# Patient Record
Sex: Male | Born: 1950 | Race: Black or African American | Hispanic: No | Marital: Single | Smoking: Current every day smoker
Health system: Southern US, Community
[De-identification: ages and names within clinical notes are randomized; demographics above are authoritative.]

## PROBLEM LIST (undated history)

## (undated) DIAGNOSIS — D649 Anemia, unspecified: Secondary | ICD-10-CM

## (undated) DIAGNOSIS — C61 Malignant neoplasm of prostate: Secondary | ICD-10-CM

## (undated) DIAGNOSIS — C419 Malignant neoplasm of bone and articular cartilage, unspecified: Secondary | ICD-10-CM

## (undated) DIAGNOSIS — B1920 Unspecified viral hepatitis C without hepatic coma: Secondary | ICD-10-CM

## (undated) DIAGNOSIS — D696 Thrombocytopenia, unspecified: Secondary | ICD-10-CM

## (undated) DIAGNOSIS — R7989 Other specified abnormal findings of blood chemistry: Secondary | ICD-10-CM

## (undated) DIAGNOSIS — R945 Abnormal results of liver function studies: Secondary | ICD-10-CM

## (undated) HISTORY — PX: HAND SURGERY: SHX662

## (undated) HISTORY — PX: PROSTATE BIOPSY: SHX241

---

## 2007-08-02 ENCOUNTER — Emergency Department (HOSPITAL_COMMUNITY): Admission: EM | Admit: 2007-08-02 | Discharge: 2007-08-02 | Payer: Self-pay | Admitting: Emergency Medicine

## 2011-04-01 NOTE — Consult Note (Signed)
NAME:  TRESHAWN, ALLEN NO.:  [id number]   MEDICAL RECORD NO.:  [id number]          PATIENT TYPE:  EMS   LOCATION:  [facility]                         FACILITY:  [facility]   PHYSICIAN:  Artist Pais. Weingold, M.D.DATE OF BIRTH:  04-08-1951   DATE OF CONSULTATION:  08/02/2007  DATE OF DISCHARGE:                                 CONSULTATION   REASON FOR CONSULTATION:  Mr. 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  He has no known drug allergies.  Currently on  tramadol for arthritis type pains.  No recent hospitalizations or  surgery.   FAMILY HISTORY:  Noncontributory.   SOCIAL HISTORY:  Noncontributory.   PHYSICAL EXAMINATION:  Examination today, well-nourished male, pleasant,  alert, and oriented x3.  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  It is transverse in  nature.  He has the amputated part with him which unfortunately has  segmental injuries and traction signs of the digital artery and digital  nerve.   X-rays show a DIP level amputation.   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  Today, we had a thorough and frank discussion about our treatment  options.  Unfortunately, his tip is not replantable.  It is segmental in  the nature of its injury with traction signs.  Therefore, we discussed a  skeletal shortening and primary closure with a V-Y flap.  He was taken  to the Procedure Room in the Emergency Room.  He was given a 0.25%  Marcaine digital sheath block when active anesthesia was obtained.  He  was prepped and draped in the usual sterile fashion.  A Penrose drain  was used as a tourniquet.  Bilateral neurectomies were performed  followed by  skeletal shortening using rongeur of the head of the middle  phalanx.  After this was done, a 15 blade was used to raise a V-Y flap  which was advanced distally over the exposed distal phalangeal bone and  sutured with 4-0 Vicryl Rapide suture.  It was advanced proximally 1 cm.  The defect was closed with the same 4-0 Vicryl Rapide suture.  He was  then placed in a sterile dressing of Xeroform 4x4, fluffs and a Coban  wrap.  The patient was discharged in the Emergency Department with  Keflex 500 mg 4 times a day for 5 days of antibiotic prophylaxis.  He  received a gram of Ancef in the Emergency Room.  His tetanus was up to  date.  He was also given Percocet for pain.  He is to followup in my  office this Thursday.  He is to call me if there is any signs of  infection, bleeding, etc.      Artist Pais. Mina Marble, M.D.  Electronically Signed     MAW/MEDQ  D:  08/02/2007  T:  08/03/2007  Job:  [id number]

## 2014-11-14 ENCOUNTER — Encounter (HOSPITAL_COMMUNITY): Payer: Self-pay | Admitting: *Deleted

## 2014-11-14 ENCOUNTER — Emergency Department (HOSPITAL_COMMUNITY)
Admission: EM | Admit: 2014-11-14 | Discharge: 2014-11-14 | Disposition: A | Discharge status: Home / Self Care | Payer: Medicaid Other | Attending: Emergency Medicine | Admitting: Emergency Medicine

## 2014-11-14 DIAGNOSIS — X58XXXA Exposure to other specified factors, initial encounter: Secondary | ICD-10-CM | POA: Diagnosis not present

## 2014-11-14 DIAGNOSIS — Y9389 Activity, other specified: Secondary | ICD-10-CM | POA: Diagnosis not present

## 2014-11-14 DIAGNOSIS — T161XXA Foreign body in right ear, initial encounter: Secondary | ICD-10-CM | POA: Insufficient documentation

## 2014-11-14 DIAGNOSIS — Y929 Unspecified place or not applicable: Secondary | ICD-10-CM | POA: Insufficient documentation

## 2014-11-14 DIAGNOSIS — Z720 Tobacco use: Secondary | ICD-10-CM | POA: Insufficient documentation

## 2014-11-14 DIAGNOSIS — H9201 Otalgia, right ear: Secondary | ICD-10-CM | POA: Diagnosis present

## 2014-11-14 DIAGNOSIS — Y998 Other external cause status: Secondary | ICD-10-CM | POA: Insufficient documentation

## 2014-11-14 NOTE — Discharge Instructions (Signed)
Ear Foreign Body An ear foreign body is an object that is stuck in the ear. Objects in the ear can cause pain, hearing loss, and buzzing or roaring sounds. They can also cause fluid to come from the ear. HOME CARE   Keep all doctor visits as told.  Keep small objects away from children. Tell them not to put things in their ears. GET HELP RIGHT AWAY IF:   You have blood coming from your ear.  You have more pain or puffiness (swelling) in the ear.  You have trouble hearing.  You have fluid (discharge) coming from the ear.  You have a fever.  You get a headache. MAKE SURE YOU:   Understand these instructions.  Will watch your condition.  Will get help right away if you are not doing well or get worse. Document Released: 04/23/2010 Document Revised: 01/26/2012 Document Reviewed: 04/23/2010 [facility] Patient Information 2015 [place], [place]. This information is not intended to replace advice given to you by your health care provider. Make sure you discuss any questions you have with your health care provider.

## 2014-11-14 NOTE — ED Notes (Signed)
Pt was using a glass thermometer  To scratch his rt ear and thermometer broke off in ear.  Pt says the mercury fell out on the bed.

## 2014-11-14 NOTE — ED Provider Notes (Signed)
CSN: [id number]     Arrival date & time 11/14/14  1157 History   First MD Initiated Contact with Patient 11/14/14 1356     Chief Complaint  Patient presents with  . Otalgia     (Consider location/radiation/quality/duration/timing/severity/associated sxs/prior Treatment) HPI  Ryan Ware is a 63 y.o. male who presents to the Emergency Department complaining of foreign body to the right ear canal.  He states that he was using a glass thermometer to scratch his ear and the thermometer broke leaving the metal tip inside his ear.  He states that he tilted his ear and saw the mercury fall out.  He reports decreased hearing to the right ear and "pressure".  He denies bleeding of the ear, dizziness or headache.     History reviewed. No pertinent past medical history. Past Surgical History  Procedure Laterality Date  . Hand surgery     History reviewed. No pertinent family history. History  Substance Use Topics  . Smoking status: Current Every Day Smoker  . Smokeless tobacco: Not on file  . Alcohol Use: Yes    Review of Systems  Constitutional: Negative for fever.  HENT: Positive for ear pain and hearing loss. Negative for ear discharge, facial swelling and trouble swallowing.   Skin: Negative for color change.  Neurological: Negative for dizziness, weakness, numbness and headaches.  All other systems reviewed and are negative.     Allergies  Review of patient's allergies indicates no known allergies.  Home Medications   Prior to Admission medications   Medication Sig Start Date End Date Taking? Authorizing Provider  Multiple Vitamin (MULTIVITAMIN WITH MINERALS) TABS tablet Take 1 tablet by mouth daily.   Yes Historical Provider, MD   BP 124/81 mmHg  Pulse 85  Temp(Src) 98.2 F (36.8 C) (Oral)  Resp 20  Ht 6\' 6"  (1.981 m)  Wt 230 lb (104.327 kg)  BMI 26.58 kg/m2  SpO2 97% Physical Exam  Constitutional: He is oriented to person, place, and time. He appears  well-developed and well-nourished. No distress.  HENT:  Head: Normocephalic and atraumatic.  Right Ear: External ear normal. A foreign body is present. No hemotympanum.  Left Ear: Tympanic membrane and ear canal normal.  Mouth/Throat: Oropharynx is clear and moist.  Foreign body of the right ear canal.  No bleeding or lacerations to the external ear of canal.    Neck: Normal range of motion. Neck supple.  Cardiovascular: Normal rate, regular rhythm, normal heart sounds and intact distal pulses.   No murmur heard. Pulmonary/Chest: Effort normal and breath sounds normal. No respiratory distress.  Musculoskeletal: Normal range of motion.  Neurological: He is alert and oriented to person, place, and time. He exhibits normal muscle tone. Coordination normal.  Skin: Skin is warm and dry.  Nursing note and vitals reviewed.   ED Course  Procedures (including critical care time) Labs Review Labs Reviewed - No data to display  Imaging Review No results found.   EKG Interpretation None      MDM   Final diagnoses:  Foreign body in ear, right, initial encounter    Several attempts made to remove the object using alligator forceps, but unsuccessful.  No bleeding to the canal.  Will consult ENT.    [place] Dr. Wilburn Cornelia, discussed findings.  Will see pt in his office tomorrow.  Senya Hinzman L. [place], PA-C 11/15/14 [place], DO 11/16/14 [phone]

## 2016-01-28 ENCOUNTER — Emergency Department (HOSPITAL_COMMUNITY)
Admission: EM | Admit: 2016-01-28 | Discharge: 2016-01-28 | Disposition: A | Discharge status: Home / Self Care | Payer: Medicaid Other | Attending: Emergency Medicine | Admitting: Emergency Medicine

## 2016-01-28 ENCOUNTER — Emergency Department (HOSPITAL_COMMUNITY): Payer: Medicaid Other

## 2016-01-28 ENCOUNTER — Encounter (HOSPITAL_COMMUNITY): Payer: Self-pay | Admitting: Emergency Medicine

## 2016-01-28 DIAGNOSIS — Z79899 Other long term (current) drug therapy: Secondary | ICD-10-CM | POA: Diagnosis not present

## 2016-01-28 DIAGNOSIS — M549 Dorsalgia, unspecified: Secondary | ICD-10-CM | POA: Insufficient documentation

## 2016-01-28 DIAGNOSIS — R319 Hematuria, unspecified: Secondary | ICD-10-CM | POA: Diagnosis not present

## 2016-01-28 DIAGNOSIS — F17210 Nicotine dependence, cigarettes, uncomplicated: Secondary | ICD-10-CM | POA: Diagnosis not present

## 2016-01-28 DIAGNOSIS — R109 Unspecified abdominal pain: Secondary | ICD-10-CM | POA: Diagnosis present

## 2016-01-28 DIAGNOSIS — M546 Pain in thoracic spine: Secondary | ICD-10-CM

## 2016-01-28 LAB — URINALYSIS, ROUTINE W REFLEX MICROSCOPIC
BILIRUBIN URINE: NEGATIVE
GLUCOSE, UA: NEGATIVE mg/dL
Ketones, ur: NEGATIVE mg/dL
Leukocytes, UA: NEGATIVE
Nitrite: NEGATIVE
PH: 6 (ref 5.0–8.0)

## 2016-01-28 LAB — CBC WITH DIFFERENTIAL/PLATELET
Basophils Absolute: 0 10*3/uL (ref 0.0–0.1)
Basophils Relative: 1 %
EOS ABS: 0.1 10*3/uL (ref 0.0–0.7)
Eosinophils Relative: 2 %
HEMATOCRIT: 51.1 % (ref 39.0–52.0)
HEMOGLOBIN: 16.7 g/dL (ref 13.0–17.0)
LYMPHS ABS: 3.3 10*3/uL (ref 0.7–4.0)
Lymphocytes Relative: 52 %
MCH: 29.4 pg (ref 26.0–34.0)
MCHC: 32.7 g/dL (ref 30.0–36.0)
MCV: 90 fL (ref 78.0–100.0)
MONO ABS: 0.4 10*3/uL (ref 0.1–1.0)
MONOS PCT: 7 %
NEUTROS ABS: 2.4 10*3/uL (ref 1.7–7.7)
NEUTROS PCT: 38 %
Platelets: 140 10*3/uL — ABNORMAL LOW (ref 150–400)
RBC: 5.68 MIL/uL (ref 4.22–5.81)
RDW: 15.4 % (ref 11.5–15.5)
WBC: 6.2 10*3/uL (ref 4.0–10.5)

## 2016-01-28 LAB — BASIC METABOLIC PANEL
Anion gap: 5 (ref 5–15)
BUN: 15 mg/dL (ref 6–20)
CHLORIDE: 104 mmol/L (ref 101–111)
CO2: 30 mmol/L (ref 22–32)
CREATININE: 1.1 mg/dL (ref 0.61–1.24)
Calcium: 9.5 mg/dL (ref 8.9–10.3)
GFR calc Af Amer: >60 mL/min (ref >60)
GFR calc non Af Amer: >60 mL/min (ref >60)
Glucose, Bld: 95 mg/dL (ref 65–99)
Potassium: 4 mmol/L (ref 3.5–5.1)
Sodium: 139 mmol/L (ref 135–145)

## 2016-01-28 LAB — URINE MICROSCOPIC-ADD ON

## 2016-01-28 LAB — TROPONIN I: Troponin I: <0.03 ng/mL (ref <0.031)

## 2016-01-28 LAB — D-DIMER, QUANTITATIVE: D-Dimer, Quant: 3.84 ug/mL-FEU — ABNORMAL HIGH (ref 0.00–0.50)

## 2016-01-28 MED ORDER — IOHEXOL 350 MG/ML SOLN
100.0000 mL | Freq: Once | INTRAVENOUS | Status: AC | PRN
  Administered 2016-01-28: 100 mL via INTRAVENOUS

## 2016-01-28 MED ORDER — CEPHALEXIN 500 MG PO CAPS: 500.0000 mg | ORAL_CAPSULE | Freq: Four times a day (QID) | ORAL | Status: DC

## 2016-01-28 MED ORDER — DEXTROSE 5 % IV SOLN
1.0000 g | Freq: Once | INTRAVENOUS | Status: AC
  Administered 2016-01-28: 1 g via INTRAVENOUS
  Filled 2016-01-28: qty 10

## 2016-01-28 MED ORDER — HYDROCODONE-ACETAMINOPHEN 5-325 MG PO TABS: 1.0000 | ORAL_TABLET | Freq: Four times a day (QID) | ORAL | Status: DC | PRN

## 2016-01-28 MED ORDER — ACETAMINOPHEN 325 MG PO TABS
650.0000 mg | ORAL_TABLET | Freq: Once | ORAL | Status: AC
  Administered 2016-01-28: 650 mg via ORAL
  Filled 2016-01-28: qty 2

## 2016-01-28 NOTE — ED Provider Notes (Signed)
CSN: [id number]     Arrival date & time 01/28/16  1157 History   First MD Initiated Contact with Patient 01/28/16 1453     Chief Complaint  Patient presents with  . Flank Pain     (Consider location/radiation/quality/duration/timing/severity/associated sxs/prior Treatment) Patient is a 65 y.o. male presenting with flank pain. The history is provided by the patient.  Flank Pain Associated symptoms include chest pain. Pertinent negatives include no abdominal pain, no headaches and no shortness of breath.   patient with a three-week history of right thoracic back pain just at the tip of the scapula radiates around to the right anterior part of the chest. Also today started with blood in the urine. This is a new finding. No pain with urination. No fevers no nausea or vomiting no abdominal pain no shortness of breath. The back pain is constant. Not made worse by movements of the right arm.  History reviewed. No pertinent past medical history. Past Surgical History  Procedure Laterality Date  . Hand surgery     No family history on file. Social History  Substance Use Topics  . Smoking status: Current Every Day Smoker    Types: Cigarettes  . Smokeless tobacco: None  . Alcohol Use: Yes     Comment: occasional    Review of Systems  Constitutional: Negative for fever.  HENT: Negative for congestion.   Eyes: Negative for visual disturbance.  Respiratory: Negative for shortness of breath.   Cardiovascular: Positive for chest pain. Negative for leg swelling.  Gastrointestinal: Negative for abdominal pain.  Genitourinary: Positive for hematuria and flank pain. Negative for dysuria.  Musculoskeletal: Positive for back pain. Negative for neck pain.  Skin: Negative for rash.  Neurological: Negative for headaches.  Hematological: Does not bruise/bleed easily.  Psychiatric/Behavioral: Negative for confusion.      Allergies  Review of patient's allergies indicates no known  allergies.  Home Medications   Prior to Admission medications   Medication Sig Start Date End Date Taking? Authorizing Provider  aspirin-acetaminophen-caffeine (EXCEDRIN MIGRAINE) [phone] MG tablet Take 1 tablet by mouth every 6 (six) hours as needed (pain).   Yes Historical Provider, MD  ibuprofen (ADVIL,MOTRIN) 200 MG tablet Take 400 mg by mouth every 6 (six) hours as needed for moderate pain.   Yes Historical Provider, MD  Multiple Vitamin (MULTIVITAMIN WITH MINERALS) TABS tablet Take 1 tablet by mouth daily.   Yes Historical Provider, MD  cephALEXin (KEFLEX) 500 MG capsule Take 1 capsule (500 mg total) by mouth 4 (four) times daily. 01/28/16   Fredia Sorrow, MD  HYDROcodone-acetaminophen (NORCO/VICODIN) 5-325 MG tablet Take 1-2 tablets by mouth every 6 (six) hours as needed. 01/28/16   Fredia Sorrow, MD   BP 147/62 mmHg  Pulse 64  Temp(Src) 97.9 F (36.6 C) (Oral)  Resp 19  Ht 6' 5.5" (1.969 m)  Wt 95.737 kg  BMI 24.69 kg/m2  SpO2 97% Physical Exam  Constitutional: He is oriented to person, place, and time. He appears well-developed and well-nourished. No distress.  HENT:  Head: Normocephalic and atraumatic.  Mouth/Throat: Oropharynx is clear and moist.  Eyes: Conjunctivae and EOM are normal. Pupils are equal, round, and reactive to light.  Neck: Normal range of motion. Neck supple.  Cardiovascular: Normal rate, regular rhythm and normal heart sounds.   No murmur heard. Pulmonary/Chest: Effort normal and breath sounds normal. No respiratory distress. He exhibits no tenderness.  Abdominal: Soft. Bowel sounds are normal. There is no tenderness.  Musculoskeletal: Normal range of motion.  Neurological: He is alert and oriented to person, place, and time. No cranial nerve deficit. He exhibits normal muscle tone. Coordination normal.  Skin: Skin is warm. No rash noted.  Nursing note and vitals reviewed.   ED Course  Procedures (including critical care time) Labs  Review Labs Reviewed  URINALYSIS, ROUTINE W REFLEX MICROSCOPIC (NOT AT [facility]) - Abnormal; Notable for the following:    Specific Gravity, Urine >1.030 (*)    Hgb urine dipstick LARGE (*)    Protein, ur TRACE (*)    All other components within normal limits  CBC WITH DIFFERENTIAL/PLATELET - Abnormal; Notable for the following:    Platelets 140 (*)    All other components within normal limits  D-DIMER, QUANTITATIVE (NOT AT [facility]) - Abnormal; Notable for the following:    D-Dimer, Quant 3.84 (*)    All other components within normal limits  URINE MICROSCOPIC-ADD ON - Abnormal; Notable for the following:    Squamous Epithelial / LPF 0-5 (*)    Bacteria, UA FEW (*)    All other components within normal limits  URINE CULTURE  BASIC METABOLIC PANEL  TROPONIN I    Imaging Review Dg Chest 2 View  01/28/2016  CLINICAL DATA:  Flank pain and hematuria. Back pain and anterior chest pain. EXAM: CHEST  2 VIEW COMPARISON:  None. FINDINGS: The heart size and mediastinal contours are within normal limits. Both lungs are clear. The visualized skeletal structures are unremarkable. Slight elevation the left hemidiaphragm, nonspecific. IMPRESSION: No acute abnormality. Electronically Signed   By: Lorriane Shire M.D.   On: 01/28/2016 15:45   Ct Angio Chest Pe W/cm &/or Wo Cm  01/28/2016  CLINICAL DATA:  Right-sided chest pain and right flank pain with hematuria for 3 days, elevated D-dimer EXAM: CT ANGIOGRAPHY CHEST WITH CONTRAST TECHNIQUE: Multidetector CT imaging of the chest was performed using the standard protocol during bolus administration of intravenous contrast. Multiplanar CT image reconstructions and MIPs were obtained to evaluate the vascular anatomy. CONTRAST:  128mL OMNIPAQUE IOHEXOL 350 MG/ML SOLN COMPARISON:  Mediastinum/Nodes: Thoracic inlet is normal. There is no significant hilar or mediastinal adenopathy. Heart appears normal. There are no pericardial effusions. The thoracic aorta shows no  dissection or dilatation. There are no filling defects in the pulmonary arterial system. Lungs/Pleura: Lungs are clear except for mild bilateral dependent atelectasis. There are no pleural effusions. Upper abdomen: Negative Musculoskeletal: The T9 vertebral body demonstrates lytic change and compression deformity with an acute appearance. Compression involves both the superior and inferior endplates and involves about 25% height. No retropulsion. There is suggestion of abnormal soft tissue around the vertebral body suggesting paraspinous mass. There is lytic change in the posterior aspect of the T10 vertebral body with loss of central posterior cortex. There is evidence of expansion of the posterior cortex causing canal narrowing. FINDINGS: T9 compression deformity, possibly pathologic. T10 vertebral body also shows lytic lesion expanding through the posterior central cortex and narrowing the canal mildly. Review of the MIP images confirms the above findings. Electronically Signed   By: Skipper Cliche M.D.   On: 01/28/2016 16:48   I have personally reviewed and evaluated these images and lab results as part of my medical decision-making.   EKG Interpretation   Date/Time:  Monday January 28 2016 15:10:06 EDT Ventricular Rate:  65 PR Interval:  196 QRS Duration: 113 QT Interval:  428 QTC Calculation: 445 R Axis:   -99 Text Interpretation:  Sinus rhythm LAE, consider biatrial enlargement Left  anterior  fascicular block Minimal ST elevation, anterior leads No previous  ECGs available Confirmed by Prabhleen Montemayor  MD, Darene Nappi [phone]) on 01/28/2016  3:23:44 PM      MDM   Final diagnoses:  Hematuria  Right-sided thoracic back pain    Patient with complaint of pain at the tip of his right scapula. And blood in his urine which is been present as of this morning. The pain in the thoracic part of the back is been present for 3 weeks. Does radiate around to the anterior part of the chest. Clinically was  concerned about possible pulmonary embolus but no hypoxia. CT angiogram was done because the d-dimer was markedly elevated. However CT angios negative for any pulmonary embolus. It did find some lesions in the thoracic spine. Now the concern is for metastatic cancer prostate may be a high probability. The patient also with the onset of hematuria today will be treated as if it could be infection. Urine culture sent. Will be started on a course of Keflex. Given 1 g Rocephin here. Referral with urology as a starting point. Patient does not have a primary care doctor.    Fredia Sorrow, MD 01/28/16 1747

## 2016-01-28 NOTE — Discharge Instructions (Signed)
For the hematuria take the antibiotic Keflex as directed. Make an appointment to follow-up with urology. Also as we discussed there are some lesions on the thoracic part of your back that could be related to cancer periods also possible could be related to prostate cancer that is spread. Make an appointment with urology for follow-up with this as well. Take the pain medicine as needed.

## 2016-01-28 NOTE — ED Notes (Signed)
Dr Rogene Houston informed of D-dimer of 3.84

## 2016-01-28 NOTE — ED Notes (Signed)
Pt c/o right flank pain with hematuria x 3 days.

## 2016-01-29 LAB — URINE CULTURE

## 2016-02-21 ENCOUNTER — Emergency Department (HOSPITAL_COMMUNITY): Payer: Medicaid Other

## 2016-02-21 ENCOUNTER — Emergency Department (HOSPITAL_COMMUNITY)
Admission: EM | Admit: 2016-02-21 | Discharge: 2016-02-21 | Disposition: A | Discharge status: Home / Self Care | Payer: Medicaid Other | Attending: Emergency Medicine | Admitting: Emergency Medicine

## 2016-02-21 ENCOUNTER — Encounter (HOSPITAL_COMMUNITY): Payer: Self-pay | Admitting: Emergency Medicine

## 2016-02-21 DIAGNOSIS — M546 Pain in thoracic spine: Secondary | ICD-10-CM

## 2016-02-21 DIAGNOSIS — Z79899 Other long term (current) drug therapy: Secondary | ICD-10-CM | POA: Diagnosis not present

## 2016-02-21 DIAGNOSIS — M545 Low back pain: Secondary | ICD-10-CM | POA: Diagnosis present

## 2016-02-21 DIAGNOSIS — F17210 Nicotine dependence, cigarettes, uncomplicated: Secondary | ICD-10-CM | POA: Diagnosis not present

## 2016-02-21 LAB — BASIC METABOLIC PANEL
ANION GAP: 9 (ref 5–15)
BUN: 14 mg/dL (ref 6–20)
CO2: 30 mmol/L (ref 22–32)
Calcium: 9.6 mg/dL (ref 8.9–10.3)
Chloride: 100 mmol/L — ABNORMAL LOW (ref 101–111)
Creatinine, Ser: 0.93 mg/dL (ref 0.61–1.24)
GLUCOSE: 174 mg/dL — AB (ref 65–99)
Potassium: 3.9 mmol/L (ref 3.5–5.1)
Sodium: 139 mmol/L (ref 135–145)

## 2016-02-21 LAB — CBC WITH DIFFERENTIAL/PLATELET
BASOS ABS: 0 10*3/uL (ref 0.0–0.1)
BASOS PCT: 0 %
EOS ABS: 0.1 10*3/uL (ref 0.0–0.7)
EOS PCT: 1 %
HCT: 47.7 % (ref 39.0–52.0)
Hemoglobin: 16.1 g/dL (ref 13.0–17.0)
Lymphocytes Relative: 29 %
Lymphs Abs: 2.5 10*3/uL (ref 0.7–4.0)
MCH: 29.9 pg (ref 26.0–34.0)
MCHC: 33.8 g/dL (ref 30.0–36.0)
MCV: 88.5 fL (ref 78.0–100.0)
MONO ABS: 0.7 10*3/uL (ref 0.1–1.0)
MONOS PCT: 8 %
Neutro Abs: 5.4 10*3/uL (ref 1.7–7.7)
Neutrophils Relative %: 62 %
PLATELETS: 75 10*3/uL — AB (ref 150–400)
RBC: 5.39 MIL/uL (ref 4.22–5.81)
RDW: 15.1 % (ref 11.5–15.5)
WBC: 8.7 10*3/uL (ref 4.0–10.5)

## 2016-02-21 LAB — URINALYSIS, ROUTINE W REFLEX MICROSCOPIC
GLUCOSE, UA: NEGATIVE mg/dL
KETONES UR: NEGATIVE mg/dL
Nitrite: NEGATIVE
PROTEIN: 100 mg/dL — AB
Specific Gravity, Urine: 1.025 (ref 1.005–1.030)
pH: 5 (ref 5.0–8.0)

## 2016-02-21 LAB — URINE MICROSCOPIC-ADD ON

## 2016-02-21 LAB — HEPATIC FUNCTION PANEL
ALBUMIN: 3.8 g/dL (ref 3.5–5.0)
ALT: 84 U/L — AB (ref 17–63)
AST: 91 U/L — AB (ref 15–41)
Alkaline Phosphatase: 83 U/L (ref 38–126)
BILIRUBIN DIRECT: 0.2 mg/dL (ref 0.1–0.5)
BILIRUBIN TOTAL: 0.5 mg/dL (ref 0.3–1.2)
Indirect Bilirubin: 0.3 mg/dL (ref 0.3–0.9)
Total Protein: 8.2 g/dL — ABNORMAL HIGH (ref 6.5–8.1)

## 2016-02-21 MED ORDER — TAMSULOSIN HCL 0.4 MG PO CAPS: 0.4000 mg | ORAL_CAPSULE | Freq: Every day | ORAL | Status: DC

## 2016-02-21 MED ORDER — OXYCODONE-ACETAMINOPHEN 5-325 MG PO TABS
1.0000 | ORAL_TABLET | Freq: Once | ORAL | Status: AC
  Administered 2016-02-21: 1 via ORAL
  Filled 2016-02-21: qty 1

## 2016-02-21 MED ORDER — OXYCODONE-ACETAMINOPHEN 5-325 MG PO TABS: 1.0000 | ORAL_TABLET | Freq: Four times a day (QID) | ORAL | Status: DC | PRN

## 2016-02-21 NOTE — ED Notes (Addendum)
Pt reports LT sided shoulder pain x 1 month. Pt seen here last month and was diagnosed with hematuria. Reports hematuria has resolved. Pt also found to have lesions on thoracic spine that could be related to metastatic prostate cancer. Pt states he has an appointment with urology on 4/18, but states pain is unbearable.

## 2016-02-21 NOTE — ED Provider Notes (Signed)
CSN: [id number]     Arrival date & time 02/21/16  1503 History   First MD Initiated Contact with Patient 02/21/16 1551     Chief Complaint  Patient presents with  . Shoulder Pain     (Consider location/radiation/quality/duration/timing/severity/associated sxs/prior Treatment) HPI 65 year old male who presents with back pain. Was recently seen in the emergency department in March 2017 with left upper back pain. He had a CT chest at that time that was concerning for compression fracture and lesions in the thoracic spine with possible paraspinous mass concerning for metastatic lesions. Set up for urology appointment for w/u of probably prostate cancer. Does not have PCP. States that he has continued to have progressively worsening pain in his thoracic back now are primarily involving the left upper back around the left scapula. He has not had any falls or trauma. Pain radiates around his rib cage to the chest wall anteriorly, and worse with certain positions and movement of his left arm. He has not had any numbness or weakness, fevers or chills. Has noted that since his last ED visit he has lost 15 pounds unintentionally and occasionally with some night sweats. Not having any cough, shortness of breath, syncope or near syncope, leg swelling or leg pain.   Past Medical History  Diagnosis Date  . Cancer ([place])     Prostrate Cancer with mets   Past Surgical History  Procedure Laterality Date  . Hand surgery     No family history on file. Social History  Substance Use Topics  . Smoking status: Current Every Day Smoker -- 0.50 packs/day    Types: Cigarettes  . Smokeless tobacco: None  . Alcohol Use: Yes     Comment: occasional    Review of Systems 10/14 systems reviewed and are negative other than those stated in the HPI   Allergies  Review of patient's allergies indicates no known allergies.  Home Medications   Prior to Admission medications   Medication Sig Start Date End Date  Taking? Authorizing Provider  acetaminophen (TYLENOL) 500 MG tablet Take 500 mg by mouth every 6 (six) hours as needed for mild pain or moderate pain.   Yes Historical Provider, MD  Multiple Vitamin (MULTIVITAMIN WITH MINERALS) TABS tablet Take 1 tablet by mouth daily.   Yes Historical Provider, MD  cephALEXin (KEFLEX) 500 MG capsule Take 1 capsule (500 mg total) by mouth 4 (four) times daily. Patient not taking: Reported on 02/21/2016 01/28/16   Fredia Sorrow, MD  HYDROcodone-acetaminophen (NORCO/VICODIN) 5-325 MG tablet Take 1-2 tablets by mouth every 6 (six) hours as needed. Patient not taking: Reported on 02/21/2016 01/28/16   Fredia Sorrow, MD  oxyCODONE-acetaminophen (PERCOCET/ROXICET) 5-325 MG tablet Take 1 tablet by mouth every 6 (six) hours as needed for moderate pain or severe pain. 02/21/16   Forde Dandy, MD  tamsulosin (FLOMAX) 0.4 MG CAPS capsule Take 1 capsule (0.4 mg total) by mouth daily. 02/21/16   Forde Dandy, MD   BP 118/69 mmHg  Pulse 87  Temp(Src) 98.2 F (36.8 C) (Oral)  Resp 17  Ht 6\' 5"  (1.956 m)  Wt 210 lb (95.255 kg)  BMI 24.90 kg/m2  SpO2 97% Physical Exam Physical Exam  Nursing note and vitals reviewed. Constitutional: Non-toxic, and in no acute distress Head: Normocephalic and atraumatic.  Mouth/Throat: Oropharynx is clear and moist.  Neck: Normal range of motion. Neck supple.  Cardiovascular: Normal rate and regular rhythm.   Pulmonary/Chest: Effort normal and breath sounds normal. Tender over left  paraspinal thoracic just medial to the scapula. No palpable deformities or bruising. Abdominal: Soft. There is no tenderness. There is no rebound and no guarding.  Musculoskeletal: Normal range of motion.  Neurological: Alert, no facial droop, fluent speech, moves all extremities symmetrically, normal gait, full sensation in bilateral lower extremities Skin: Skin is warm and dry.  Psychiatric: Cooperative  ED Course  Procedures (including critical care  time) Labs Review Labs Reviewed  URINALYSIS, ROUTINE W REFLEX MICROSCOPIC (NOT AT [facility]) - Abnormal; Notable for the following:    Color, Urine AMBER (*)    APPearance HAZY (*)    Hgb urine dipstick LARGE (*)    Bilirubin Urine SMALL (*)    Protein, ur 100 (*)    Leukocytes, UA TRACE (*)    All other components within normal limits  CBC WITH DIFFERENTIAL/PLATELET - Abnormal; Notable for the following:    Platelets 75 (*)    All other components within normal limits  BASIC METABOLIC PANEL - Abnormal; Notable for the following:    Chloride 100 (*)    Glucose, Bld 174 (*)    All other components within normal limits  HEPATIC FUNCTION PANEL - Abnormal; Notable for the following:    Total Protein 8.2 (*)    AST 91 (*)    ALT 84 (*)    All other components within normal limits  URINE MICROSCOPIC-ADD ON - Abnormal; Notable for the following:    Squamous Epithelial / LPF 0-5 (*)    Bacteria, UA FEW (*)    All other components within normal limits  PSA  PSA, TOTAL AND FREE    Imaging Review Dg Chest 2 View  02/21/2016  CLINICAL DATA:  Left upper back pain, left shoulder pain, left side chest pain EXAM: CHEST  2 VIEW COMPARISON:  01/28/2016 FINDINGS: Cardiomediastinal silhouette is stable. Again noted mild elevation of the left hemidiaphragm. No infiltrate or pleural effusion. Bony thorax is unremarkable. IMPRESSION: No active cardiopulmonary disease.  No significant change. Electronically Signed   By: Lahoma Crocker M.D.   On: 02/21/2016 17:10     I have personally reviewed and evaluated these images and lab results as part of my medical decision-making.   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 On review of the CTA performed last month, with compression fracture associated with this as well. Progressive  pain since this diagnosis, and has not yet followed up with any physician. Spoke with Dr. Alyson Ingles, who patient has scheduled appt with on 4/18. To expedite work-up, obtaining PSA and CMP. He requests that patient call his office tomorrow for same day follow-up, and this was relayed to patient. Case management consult placed for patient as well to coordinate care and obtain PCP. Patient with in tact neuro exam. No complaints of saddle anesthesia, bowel/urinary incontinence/retention. He is able to ambulate. I do no suspect spinal cord compression at this time. Provided strict return and follow-up instructions with course of opioid analgesics until follow-up.    Forde Dandy, MD 02/22/16 [phone]

## 2016-02-21 NOTE — Discharge Instructions (Signed)
Based on your last CT scan, your back pain was concerning for lesions and fracture that could be suggestive of cancer. Dr. Noah Delaine, from urology, recommended that you call his office tomorrow morning to see if he will be able to see you tomorrow. Please return without fail for worsening symptoms, including fevers, worsening pain, loss of control of bowel, numbness or weakness in the legs, inability to walk, loss of control of urine, or any other symptoms concerning to you.  Back Pain, Adult Back pain is very common. The pain often gets better over time. The cause of back pain is usually not dangerous. Most people can learn to manage their back pain on their own.  HOME CARE  Watch your back pain for any changes. The following actions may help to lessen any pain you are feeling:  Stay active. Start with short walks on flat ground if you can. Try to walk farther each day.  Exercise regularly as told by your doctor. Exercise helps your back heal faster. It also helps avoid future injury by keeping your muscles strong and flexible.  Do not sit, drive, or stand in one place for more than 30 minutes.  Do not stay in bed. Resting more than 1-2 days can slow down your recovery.  Be careful when you bend or lift an object. Use good form when lifting:  Bend at your knees.  Keep the object close to your body.  Do not twist.  Sleep on a firm mattress. Lie on your side, and bend your knees. If you lie on your back, put a pillow under your knees.  Take medicines only as told by your doctor.  Put ice on the injured area.  Put ice in a plastic bag.  Place a towel between your skin and the bag.  Leave the ice on for 20 minutes, 2-3 times a day for the first 2-3 days. After that, you can switch between ice and heat packs.  Avoid feeling anxious or stressed. Find good ways to deal with stress, such as exercise.  Maintain a healthy weight. Extra weight puts stress on your back. GET HELP IF:    You have pain that does not go away with rest or medicine.  You have worsening pain that goes down into your legs or buttocks.  You have pain that does not get better in one week.  You have pain at night.  You lose weight.  You have a fever or chills. GET HELP RIGHT AWAY IF:   You cannot control when you poop (bowel movement) or pee (urinate).  Your arms or legs feel weak.  Your arms or legs lose feeling (numbness).  You feel sick to your stomach (nauseous) or throw up (vomit).  You have belly (abdominal) pain.  You feel like you may pass out (faint).   This information is not intended to replace advice given to you by your health care provider. Make sure you discuss any questions you have with your health care provider.   Document Released: 04/21/2008 Document Revised: 11/24/2014 Document Reviewed: 03/07/2014 Elsevier Interactive Patient Education [employer].

## 2016-02-22 LAB — PSA: PSA: 1120 ng/mL — AB (ref 0.00–4.00)

## 2016-02-23 LAB — PSA, TOTAL AND FREE: PROSTATE SPECIFIC AG, SERUM: 1120 ng/mL — AB (ref 0.0–4.0)

## 2016-02-27 ENCOUNTER — Other Ambulatory Visit: Payer: Self-pay | Admitting: Urology

## 2016-02-27 ENCOUNTER — Ambulatory Visit (INDEPENDENT_AMBULATORY_CARE_PROVIDER_SITE_OTHER): Payer: Self-pay | Admitting: Urology

## 2016-02-27 DIAGNOSIS — C61 Malignant neoplasm of prostate: Secondary | ICD-10-CM

## 2016-02-27 DIAGNOSIS — R319 Hematuria, unspecified: Secondary | ICD-10-CM

## 2016-02-27 DIAGNOSIS — R310 Gross hematuria: Secondary | ICD-10-CM

## 2016-02-29 ENCOUNTER — Ambulatory Visit (HOSPITAL_COMMUNITY)
Admission: RE | Admit: 2016-02-29 | Discharge: 2016-02-29 | Disposition: A | Discharge status: Home / Self Care | Payer: Medicaid Other | Source: Ambulatory Visit | Attending: Urology | Admitting: Urology

## 2016-02-29 DIAGNOSIS — R319 Hematuria, unspecified: Secondary | ICD-10-CM | POA: Insufficient documentation

## 2016-02-29 DIAGNOSIS — C7951 Secondary malignant neoplasm of bone: Secondary | ICD-10-CM | POA: Diagnosis not present

## 2016-02-29 DIAGNOSIS — C61 Malignant neoplasm of prostate: Secondary | ICD-10-CM | POA: Insufficient documentation

## 2016-02-29 DIAGNOSIS — R109 Unspecified abdominal pain: Secondary | ICD-10-CM | POA: Insufficient documentation

## 2016-02-29 DIAGNOSIS — R590 Localized enlarged lymph nodes: Secondary | ICD-10-CM | POA: Insufficient documentation

## 2016-02-29 MED ORDER — IOPAMIDOL (ISOVUE-300) INJECTION 61%: 125.0000 mL | Freq: Once | INTRAVENOUS | Status: DC | PRN

## 2016-03-03 ENCOUNTER — Encounter (HOSPITAL_COMMUNITY): Payer: Self-pay | Attending: Urology

## 2016-03-03 ENCOUNTER — Encounter (HOSPITAL_COMMUNITY): Payer: Self-pay

## 2016-03-05 ENCOUNTER — Ambulatory Visit (HOSPITAL_COMMUNITY): Admission: RE | Admit: 2016-03-05 | Payer: Self-pay | Source: Ambulatory Visit

## 2016-03-19 ENCOUNTER — Encounter (HOSPITAL_COMMUNITY): Payer: Self-pay

## 2016-03-19 ENCOUNTER — Ambulatory Visit (HOSPITAL_COMMUNITY)
Admission: RE | Admit: 2016-03-19 | Discharge: 2016-03-19 | Disposition: A | Discharge status: Home / Self Care | Payer: Medicaid Other | Source: Ambulatory Visit | Attending: Urology | Admitting: Urology

## 2016-03-19 DIAGNOSIS — C61 Malignant neoplasm of prostate: Secondary | ICD-10-CM | POA: Diagnosis present

## 2016-03-19 MED ORDER — GENTAMICIN SULFATE 40 MG/ML IJ SOLN
INTRAMUSCULAR | Status: AC
  Administered 2016-03-19: 80 mg via INTRAMUSCULAR
  Filled 2016-03-19: qty 2

## 2016-03-19 MED ORDER — LIDOCAINE HCL (PF) 2 % IJ SOLN
INTRAMUSCULAR | Status: AC
  Administered 2016-03-19: 10 mL
  Filled 2016-03-19: qty 10

## 2016-03-19 MED ORDER — LIDOCAINE HCL (PF) 2 % IJ SOLN
10.0000 mL | Freq: Once | INTRAMUSCULAR | Status: AC
  Administered 2016-03-19: 10 mL

## 2016-03-19 MED ORDER — LIDOCAINE HCL (PF) 2 % IJ SOLN
INTRAMUSCULAR | Status: AC
Start: 2016-03-19 — End: 2016-03-19
  Filled 2016-03-19: qty 10

## 2016-03-19 MED ORDER — GENTAMICIN SULFATE 40 MG/ML IJ SOLN
80.0000 mg | Freq: Once | INTRAMUSCULAR | Status: AC
  Administered 2016-03-19: 80 mg via INTRAMUSCULAR

## 2016-03-19 NOTE — Discharge Instructions (Signed)
Transrectal Ultrasound-Guided Biopsy °A transrectal ultrasound-guided biopsy is a procedure to take samples of tissue from your prostate. Ultrasound images are used to guide the procedure. It is usually done to check the prostate gland for cancer. °BEFORE THE PROCEDURE °· Do not eat or drink after midnight on the night before your procedure. °· Take medicines as your doctor tells you. °· Your doctor may have you stop taking some medicines 5-7 days before the procedure. °· You will be given an enema before your procedure. During an enema, a liquid is put into your butt (rectum) to clear out waste. °· You may have lab tests the day of your procedure. °· Make plans to have someone drive you home. °PROCEDURE °· You will be given medicine to help you relax before the procedure. An IV tube will be put into one of your veins. It will be used to give fluids and medicine. °· You will be given medicine to reduce the risk of infection (antibiotic). °· You will be placed on your side. °· A probe with gel will be put in your butt. This is used to take pictures of your prostate and the area around it. °· A medicine to numb the area is put into your prostate. °· A biopsy needle is then inserted and guided to your prostate. °· Samples of prostate tissue are taken. The needle is removed. °· The samples are sent to a lab to be checked. Results are usually back in 2-3 days. °AFTER THE PROCEDURE °· You will be taken to a room where you will be watched until you are doing okay. °· You may have some pain in the area around your butt. You will be given medicines for this. °· You may be able to go home the same day. Sometimes, an overnight stay in the hospital is needed. °  °This information is not intended to replace advice given to you by your health care provider. Make sure you discuss any questions you have with your health care provider. °  °Document Released: 10/22/2009 Document Revised: 11/08/2013 Document Reviewed:  06/22/2013 °Elsevier Interactive Patient Education ©2016 Elsevier Inc. ° °

## 2016-03-26 ENCOUNTER — Ambulatory Visit (INDEPENDENT_AMBULATORY_CARE_PROVIDER_SITE_OTHER): Payer: Self-pay | Admitting: Urology

## 2016-03-26 DIAGNOSIS — C61 Malignant neoplasm of prostate: Secondary | ICD-10-CM

## 2016-03-26 DIAGNOSIS — R828 Abnormal findings on cytological and histological examination of urine: Secondary | ICD-10-CM

## 2016-03-26 DIAGNOSIS — C7951 Secondary malignant neoplasm of bone: Secondary | ICD-10-CM

## 2016-04-16 ENCOUNTER — Ambulatory Visit (INDEPENDENT_AMBULATORY_CARE_PROVIDER_SITE_OTHER): Payer: Medicaid Other | Admitting: Urology

## 2016-04-16 DIAGNOSIS — C61 Malignant neoplasm of prostate: Secondary | ICD-10-CM

## 2016-04-16 DIAGNOSIS — R828 Abnormal findings on cytological and histological examination of urine: Secondary | ICD-10-CM

## 2016-05-21 ENCOUNTER — Ambulatory Visit (INDEPENDENT_AMBULATORY_CARE_PROVIDER_SITE_OTHER): Payer: Self-pay | Admitting: Urology

## 2016-05-21 DIAGNOSIS — C61 Malignant neoplasm of prostate: Secondary | ICD-10-CM

## 2016-06-25 ENCOUNTER — Ambulatory Visit (INDEPENDENT_AMBULATORY_CARE_PROVIDER_SITE_OTHER): Payer: Self-pay | Admitting: Urology

## 2016-06-25 DIAGNOSIS — N5201 Erectile dysfunction due to arterial insufficiency: Secondary | ICD-10-CM

## 2016-06-25 DIAGNOSIS — C61 Malignant neoplasm of prostate: Secondary | ICD-10-CM

## 2016-10-01 ENCOUNTER — Ambulatory Visit (INDEPENDENT_AMBULATORY_CARE_PROVIDER_SITE_OTHER): Payer: Medicaid Other | Admitting: Urology

## 2016-10-01 DIAGNOSIS — N5201 Erectile dysfunction due to arterial insufficiency: Secondary | ICD-10-CM | POA: Diagnosis not present

## 2016-10-01 DIAGNOSIS — C61 Malignant neoplasm of prostate: Secondary | ICD-10-CM | POA: Diagnosis not present

## 2016-11-09 ENCOUNTER — Emergency Department (HOSPITAL_COMMUNITY)
Admission: EM | Admit: 2016-11-09 | Discharge: 2016-11-09 | Disposition: A | Discharge status: Home / Self Care | Payer: Self-pay | Attending: Emergency Medicine | Admitting: Emergency Medicine

## 2016-11-09 ENCOUNTER — Ambulatory Visit (HOSPITAL_COMMUNITY)
Admit: 2016-11-09 | Discharge: 2016-11-09 | Disposition: A | Discharge status: Home / Self Care | Payer: Self-pay | Attending: Emergency Medicine | Admitting: Emergency Medicine

## 2016-11-09 ENCOUNTER — Encounter (HOSPITAL_COMMUNITY): Payer: Self-pay | Admitting: *Deleted

## 2016-11-09 ENCOUNTER — Emergency Department (HOSPITAL_COMMUNITY): Payer: Self-pay

## 2016-11-09 DIAGNOSIS — K5909 Other constipation: Secondary | ICD-10-CM | POA: Insufficient documentation

## 2016-11-09 DIAGNOSIS — R200 Anesthesia of skin: Secondary | ICD-10-CM

## 2016-11-09 DIAGNOSIS — T402X5A Adverse effect of other opioids, initial encounter: Secondary | ICD-10-CM

## 2016-11-09 DIAGNOSIS — M5416 Radiculopathy, lumbar region: Secondary | ICD-10-CM | POA: Insufficient documentation

## 2016-11-09 DIAGNOSIS — F17210 Nicotine dependence, cigarettes, uncomplicated: Secondary | ICD-10-CM | POA: Insufficient documentation

## 2016-11-09 DIAGNOSIS — Z79899 Other long term (current) drug therapy: Secondary | ICD-10-CM | POA: Insufficient documentation

## 2016-11-09 DIAGNOSIS — C61 Malignant neoplasm of prostate: Secondary | ICD-10-CM | POA: Insufficient documentation

## 2016-11-09 DIAGNOSIS — K5903 Drug induced constipation: Secondary | ICD-10-CM

## 2016-11-09 DIAGNOSIS — R29898 Other symptoms and signs involving the musculoskeletal system: Secondary | ICD-10-CM

## 2016-11-09 HISTORY — DX: Unspecified viral hepatitis C without hepatic coma: B19.20

## 2016-11-09 LAB — CBC WITH DIFFERENTIAL/PLATELET
Basophils Absolute: 0.1 10*3/uL (ref 0.0–0.1)
Basophils Relative: 1 %
EOS ABS: 0 10*3/uL (ref 0.0–0.7)
Eosinophils Relative: 0 %
HEMATOCRIT: 36.8 % — AB (ref 39.0–52.0)
HEMOGLOBIN: 12.2 g/dL — AB (ref 13.0–17.0)
LYMPHS ABS: 3.1 10*3/uL (ref 0.7–4.0)
Lymphocytes Relative: 29 %
MCH: 28.3 pg (ref 26.0–34.0)
MCHC: 33.2 g/dL (ref 30.0–36.0)
MCV: 85.4 fL (ref 78.0–100.0)
MONOS PCT: 8 %
Monocytes Absolute: 0.8 10*3/uL (ref 0.1–1.0)
NEUTROS ABS: 6.7 10*3/uL (ref 1.7–7.7)
NEUTROS PCT: 63 %
Platelets: 99 10*3/uL — ABNORMAL LOW (ref 150–400)
RBC: 4.31 MIL/uL (ref 4.22–5.81)
RDW: 14.3 % (ref 11.5–15.5)
WBC: 10.7 10*3/uL — AB (ref 4.0–10.5)

## 2016-11-09 LAB — COMPREHENSIVE METABOLIC PANEL
ALT: 31 U/L (ref 17–63)
ANION GAP: 10 (ref 5–15)
AST: 78 U/L — ABNORMAL HIGH (ref 15–41)
Albumin: 2.8 g/dL — ABNORMAL LOW (ref 3.5–5.0)
Alkaline Phosphatase: 110 U/L (ref 38–126)
BUN: 16 mg/dL (ref 6–20)
CHLORIDE: 97 mmol/L — AB (ref 101–111)
CO2: 27 mmol/L (ref 22–32)
Calcium: 10.3 mg/dL (ref 8.9–10.3)
Creatinine, Ser: 0.79 mg/dL (ref 0.61–1.24)
Glucose, Bld: 108 mg/dL — ABNORMAL HIGH (ref 65–99)
POTASSIUM: 3.8 mmol/L (ref 3.5–5.1)
SODIUM: 134 mmol/L — AB (ref 135–145)
Total Bilirubin: 0.7 mg/dL (ref 0.3–1.2)
Total Protein: 7.7 g/dL (ref 6.5–8.1)

## 2016-11-09 MED ORDER — HYDROMORPHONE HCL 1 MG/ML IJ SOLN
1.0000 mg | Freq: Once | INTRAMUSCULAR | Status: AC
  Administered 2016-11-09: 1 mg via INTRAMUSCULAR
  Filled 2016-11-09: qty 1

## 2016-11-09 MED ORDER — OXYCODONE-ACETAMINOPHEN 5-325 MG PO TABS
2.0000 | ORAL_TABLET | Freq: Once | ORAL | Status: AC
  Administered 2016-11-09: 2 via ORAL
  Filled 2016-11-09: qty 2

## 2016-11-09 MED ORDER — OXYCODONE-ACETAMINOPHEN 5-325 MG PO TABS: 1.0000 | ORAL_TABLET | ORAL | 0 refills | Status: DC | PRN

## 2016-11-09 NOTE — ED Provider Notes (Signed)
No gross neurological deficits. Discussed MRI results with the patient. He has oncological follow-up. Discharge medications Percocet 20 mg   Nat Christen, MD 11/09/16 1216

## 2016-11-09 NOTE — ED Notes (Signed)
Patient returned from [facility] MR.

## 2016-11-09 NOTE — ED Provider Notes (Signed)
[facility] DEPT Provider Note   CSN: [id number] Arrival date & time: 11/09/16  0224  Time seen 04:25 AM   History   Chief Complaint Chief Complaint  Patient presents with  . Back Pain    HPI Ryan Ware is a 65 y.o. male.  HPI  patient states he has prostate cancer and he thinks he has metastasis to his bones. He states it was diagnosed earlier this year. He has started going to the [facility] in [place] about a month ago. He states he's getting injections every 2 months. His next appointment is on January 17. He reports he's been having low back pain for the past 3-4 weeks. He states in is in the midline in his lumbar area. He states he was told he had a cracked vertebrae and indicates his upper thoracic area. He states December 21 he was getting out of bed in his legs got weak and gave out on him and he sat down hard on the floor. He states he sat there for about 5 minutes and was able to get up. He states he has been taking his oxycodone 10 mg tablets without relief. He states tonight about 1:30 AM he again tried to get out of bed and his legs were weak and he fell. He states when he fell he fell on his knees. He denies any acute injury from the fall tonight. He states he has numbness from his right buttock all the way down his right leg to the ankle. It is been that way for 4 months. He states initially it was a burning sensation now it's numbness. He denies any urinary incontinence. He also complains of a lot of constipation and states he has not a bowel movement in 4 days. He denies nausea or vomiting. He states he feels like his abdomen is bloated. He states he is eating less because he isn't going to the bathroom.  PCP [facility] Dr Alyson Ingles  Past Medical History:  Diagnosis Date  . Cancer ([place])    Prostrate Cancer with mets  . Hepatitis C     There are no active problems to display for this patient.   Past Surgical History:  Procedure Laterality Date  .  HAND SURGERY         Home Medications    Prior to Admission medications   Medication Sig Start Date End Date Taking? Authorizing Provider  OXYCODONE HCL PO Take 10 mg by mouth 3 (three) times daily.   Yes Historical Provider, MD  polyethylene glycol (MIRALAX / GLYCOLAX) packet Take 17 g by mouth daily as needed.   Yes Historical Provider, MD  acetaminophen (TYLENOL) 500 MG tablet Take 500 mg by mouth every 6 (six) hours as needed for mild pain or moderate pain.    Historical Provider, MD  cephALEXin (KEFLEX) 500 MG capsule Take 1 capsule (500 mg total) by mouth 4 (four) times daily. Patient not taking: Reported on 02/21/2016 01/28/16   Fredia Sorrow, MD  HYDROcodone-acetaminophen (NORCO/VICODIN) 5-325 MG tablet Take 1-2 tablets by mouth every 6 (six) hours as needed. Patient not taking: Reported on 02/21/2016 01/28/16   Fredia Sorrow, MD  Multiple Vitamin (MULTIVITAMIN WITH MINERALS) TABS tablet Take 1 tablet by mouth daily.    Historical Provider, MD  oxyCODONE-acetaminophen (PERCOCET/ROXICET) 5-325 MG tablet Take 1 tablet by mouth every 6 (six) hours as needed for moderate pain or severe pain. 02/21/16   Forde Dandy, MD  tamsulosin (FLOMAX) 0.4 MG CAPS capsule Take  1 capsule (0.4 mg total) by mouth daily. 02/21/16   Forde Dandy, MD    Family History No family history on file.  Social History Social History  Substance Use Topics  . Smoking status: Current Every Day Smoker    Packs/day: 0.50    Types: Cigarettes  . Smokeless tobacco: Never Used  . Alcohol use Yes     Comment: occasional  uses a cane   Allergies   Patient has no known allergies.   Review of Systems Review of Systems  All other systems reviewed and are negative.    Physical Exam Updated Vital Signs BP 133/81 (BP Location: Left Arm)   Pulse 108   Temp 97.9 F (36.6 C) (Oral)   Resp 19   SpO2 96%   Vital signs normal except for tachycardia   Physical Exam  Constitutional: He is oriented to person,  place, and time.  Non-toxic appearance. He does not appear ill. No distress.  Thin male who appears uncomfortable, he states he's lost 46 pounds in 2 months  HENT:  Head: Normocephalic and atraumatic.  Right Ear: External ear normal.  Left Ear: External ear normal.  Nose: Nose normal. No mucosal edema or rhinorrhea.  Mouth/Throat: Mucous membranes are normal. No dental abscesses or uvula swelling.  Tongue is dry  Eyes: Conjunctivae and EOM are normal. Pupils are equal, round, and reactive to light.  Neck: Normal range of motion and full passive range of motion without pain. Neck supple.  Cardiovascular: Normal rate, regular rhythm and normal heart sounds.  Exam reveals no gallop and no friction rub.   No murmur heard. Pulmonary/Chest: Effort normal and breath sounds normal. No respiratory distress. He has no wheezes. He has no rhonchi. He has no rales. He exhibits no tenderness and no crepitus.  Abdominal: Soft. Normal appearance and bowel sounds are normal. He exhibits no distension. There is no tenderness. There is no rebound and no guarding.  Musculoskeletal: Normal range of motion. He exhibits no edema or tenderness.       Back:  Moves all extremities well.   Patient is tender in 2 areas of his lumbar spine in well localized area that makes me concerned for  bony involvement there.  Patient states his rectum is numb and he Is no feeling.  Reflexes are 0 bilaterally  Neurological: He is alert and oriented to person, place, and time. He has normal strength. No cranial nerve deficit.  Skin: Skin is warm, dry and intact. No rash noted. No erythema. No pallor.  Psychiatric: He has a normal mood and affect. His speech is normal and behavior is normal. His mood appears not anxious.  Nursing note and vitals reviewed.    ED Treatments / Results  Labs (all labs ordered are listed, but only abnormal results are displayed) Results for orders placed or performed during the hospital  encounter of 11/09/16  Comprehensive metabolic panel  Result Value Ref Range   Sodium 134 (L) 135 - 145 mmol/L   Potassium 3.8 3.5 - 5.1 mmol/L   Chloride 97 (L) 101 - 111 mmol/L   CO2 27 22 - 32 mmol/L   Glucose, Bld 108 (H) 65 - 99 mg/dL   BUN 16 6 - 20 mg/dL   Creatinine, Ser 0.79 0.61 - 1.24 mg/dL   Calcium 10.3 8.9 - 10.3 mg/dL   Total Protein 7.7 6.5 - 8.1 g/dL   Albumin 2.8 (L) 3.5 - 5.0 g/dL   AST 78 (H) 15 - 41  U/L   ALT 31 17 - 63 U/L   Alkaline Phosphatase 110 38 - 126 U/L   Total Bilirubin 0.7 0.3 - 1.2 mg/dL   GFR calc non Af Amer >60 >60 mL/min   GFR calc Af Amer >60 >60 mL/min   Anion gap 10 5 - 15  CBC with Differential  Result Value Ref Range   WBC 10.7 (H) 4.0 - 10.5 K/uL   RBC 4.31 4.22 - 5.81 MIL/uL   Hemoglobin 12.2 (L) 13.0 - 17.0 g/dL   HCT 36.8 (L) 39.0 - 52.0 %   MCV 85.4 78.0 - 100.0 fL   MCH 28.3 26.0 - 34.0 pg   MCHC 33.2 30.0 - 36.0 g/dL   RDW 14.3 11.5 - 15.5 %   Platelets 99 (L) 150 - 400 K/uL   Neutrophils Relative % 63 %   Neutro Abs 6.7 1.7 - 7.7 K/uL   Lymphocytes Relative 29 %   Lymphs Abs 3.1 0.7 - 4.0 K/uL   Monocytes Relative 8 %   Monocytes Absolute 0.8 0.1 - 1.0 K/uL   Eosinophils Relative 0 %   Eosinophils Absolute 0.0 0.0 - 0.7 K/uL   Basophils Relative 1 %   Basophils Absolute 0.1 0.0 - 0.1 K/uL   Laboratory interpretation all normal except Mild anemia, leukocytosis mild    EKG  EKG Interpretation None       Radiology Dg Lumbar Spine Complete  Result Date: 11/09/2016 CLINICAL DATA:  65 year old male with lower back pain. History of metastatic prostate cancer. EXAM: LUMBAR SPINE - COMPLETE 4+ VIEW COMPARISON:  Abdominal radiograph dated 11/09/2016 and CT dated 02/19/2016 FINDINGS: No definite acute fracture or subluxation of the lumbar spine. There is multilevel degenerative changes. There is heterogeneous appearance of the vertebral bodies likely corresponding to metastatic disease seen on the prior CT. There is  degenerative changes with anterior osteophyte at L5-S1. Multilevel facet hypertrophy. The visualized transverse and spinous processes appear intact. There is heterogeneity of the pelvic bone. IMPRESSION: No definite acute fracture or subluxation. Osteopenia with heterogeneous appearance of the vertebral bodies suspicious for metastatic disease. Electronically Signed   By: Anner Crete M.D.   On: 11/09/2016 06:56   Dg Abdomen 1 View  Result Date: 11/09/2016 CLINICAL DATA:  65 year old male with history of metastatic prostate cancer presenting with back pain. EXAM: ABDOMEN - 1 VIEW COMPARISON:  Lumbar spine radiograph dated 11/09/2016 FINDINGS: Moderate stool noted within the colon and at the rectal vault. No bowel dilatation or evidence of obstruction. No free air or radiopaque calculi noted on the provided image. There is osteopenia with heterogeneity of the bone compatible with metastatic disease seen on the previous CT. Lower lumbar degenerative changes. No acute fracture. IMPRESSION: No evidence of bowel obstruction. Heterogeneous appearance of the osseous structures concerning for underlying metastatic disease. Electronically Signed   By: Anner Crete M.D.   On: 11/09/2016 06:58    [facility] CT scan 02/29/2016 IMPRESSION: 1. Large pelvic mass contiguous with the prostate gland consistent with the given history of prostate cancer. There are multiple enlarged retroperitoneal and pelvic lymph nodes consistent with metastatic disease. 2. In addition, there are numerous osseous metastases. A large lesion involving the T8 vertebral body and its posterior elements is associated with intraspinal extension of tumor, worrisome for developing cord compression. 3. Peritoneal nodules along the right aspect of the bladder are associated with asymmetric right-sided bladder wall thickening and may contribute to the patient's hematuria. An underlying bladder mass cannot be excluded. 4.  No evidence of renal  mass, hydronephrosis or urinary tract calculus. 5. We are attempting to reach the ordering physician regarding the possible thoracic cord compression.  Electronically Signed: By: Richardean Sale M.D. On: 02/29/2016 16:40   Procedures Procedures (including critical care time)  Medications Ordered in ED Medications  HYDROmorphone (DILAUDID) injection 1 mg (1 mg Intramuscular Given 11/09/16 0456)     Initial Impression / Assessment and Plan / ED Course  I have reviewed the triage vital signs and the nursing notes.  Pertinent labs & imaging results that were available during my care of the patient were reviewed by me and considered in my medical decision making (see chart for details).  Clinical Course    Patient was given an injection for pain. X-rays were ordered of his lumbar spine and his abdomen however we discussed he would probably need to be transferred to [facility] can get a MRI of his spine to look for acute spinal cord compression due to his symptoms.  7 AM patient was given his x-ray results. His pain is better.We discussed going to [facility] for MRI of his lumbar spine to look for compression of the spinal cord and patient is agreeable.  [id number] Dr Roxanne Mins [facility] ED physician made aware of transfer for MRI.   Final Clinical Impressions(s) / ED Diagnoses   Final diagnoses:  Prostate cancer ([place])  Lower extremity weakness  Perineal numbness  Lumbar back pain with radiculopathy affecting right lower extremity  Constipation due to opioid therapy    Disposition pending MRI results, if patient needs emergency neurosurgical intervention he will stay at [facility], if not he will return to [facility] for discharge.     Rolland Porter, MD 11/09/16 [phone]

## 2016-11-09 NOTE — Discharge Instructions (Addendum)
MRI of your lower spine showed that the prostate cancer has spread into your spine. Small refill for pain medication. Follow-up your primary care doctor.

## 2016-11-09 NOTE — ED Triage Notes (Addendum)
Pt c/o lower back pain for the past three weeks, worse after two separate falls, one two days ago and one yesterday due to weakness, pt also c/o constipation, last bowel movement was 4 days ago,

## 2016-11-09 NOTE — ED Notes (Signed)
Patient at [facility] for MRI. Patient to transported back to [facility] ED after scans.

## 2016-11-09 NOTE — ED Notes (Signed)
[facility] states that patient is on his way back from [facility] MR. ETA 15 min.

## 2016-11-09 NOTE — ED Notes (Signed)
Pt continues to sleep.

## 2016-11-09 NOTE — ED Notes (Signed)
Called MRI at [facility].  Spoke with Melia and she is aware of [place] sending Pt over for MRI.  She suggested that [facility] can switch out when they return the other Pt to [place].

## 2016-11-14 ENCOUNTER — Emergency Department (HOSPITAL_COMMUNITY): Payer: Self-pay

## 2016-11-14 ENCOUNTER — Emergency Department (HOSPITAL_COMMUNITY)
Admission: EM | Admit: 2016-11-14 | Discharge: 2016-11-14 | Disposition: A | Discharge status: Home / Self Care | Payer: Self-pay | Attending: Emergency Medicine | Admitting: Emergency Medicine

## 2016-11-14 ENCOUNTER — Encounter (HOSPITAL_COMMUNITY): Payer: Self-pay | Admitting: Emergency Medicine

## 2016-11-14 DIAGNOSIS — M546 Pain in thoracic spine: Secondary | ICD-10-CM

## 2016-11-14 DIAGNOSIS — Z79899 Other long term (current) drug therapy: Secondary | ICD-10-CM | POA: Insufficient documentation

## 2016-11-14 DIAGNOSIS — C799 Secondary malignant neoplasm of unspecified site: Secondary | ICD-10-CM

## 2016-11-14 DIAGNOSIS — Z8546 Personal history of malignant neoplasm of prostate: Secondary | ICD-10-CM | POA: Insufficient documentation

## 2016-11-14 DIAGNOSIS — C7951 Secondary malignant neoplasm of bone: Secondary | ICD-10-CM | POA: Insufficient documentation

## 2016-11-14 DIAGNOSIS — F17210 Nicotine dependence, cigarettes, uncomplicated: Secondary | ICD-10-CM | POA: Insufficient documentation

## 2016-11-14 LAB — COMPREHENSIVE METABOLIC PANEL
ALK PHOS: 149 U/L — AB (ref 38–126)
ALT: 55 U/L (ref 17–63)
AST: 126 U/L — ABNORMAL HIGH (ref 15–41)
Albumin: 2.2 g/dL — ABNORMAL LOW (ref 3.5–5.0)
Anion gap: 9 (ref 5–15)
BUN: 24 mg/dL — ABNORMAL HIGH (ref 6–20)
CALCIUM: 9.1 mg/dL (ref 8.9–10.3)
CO2: 27 mmol/L (ref 22–32)
CREATININE: 0.64 mg/dL (ref 0.61–1.24)
Chloride: 94 mmol/L — ABNORMAL LOW (ref 101–111)
GFR calc non Af Amer: >60 mL/min (ref >60)
Glucose, Bld: 108 mg/dL — ABNORMAL HIGH (ref 65–99)
Potassium: 3.8 mmol/L (ref 3.5–5.1)
SODIUM: 130 mmol/L — AB (ref 135–145)
Total Bilirubin: 1.1 mg/dL (ref 0.3–1.2)
Total Protein: 7.1 g/dL (ref 6.5–8.1)

## 2016-11-14 MED ORDER — OXYCODONE-ACETAMINOPHEN 5-325 MG PO TABS
1.0000 | ORAL_TABLET | Freq: Once | ORAL | Status: AC
  Administered 2016-11-14: 1 via ORAL
  Filled 2016-11-14: qty 1

## 2016-11-14 MED ORDER — SODIUM CHLORIDE 0.9 % IV BOLUS (SEPSIS)
500.0000 mL | Freq: Once | INTRAVENOUS | Status: AC
  Administered 2016-11-14: 500 mL via INTRAVENOUS

## 2016-11-14 MED ORDER — LORAZEPAM 1 MG PO TABS
1.0000 mg | ORAL_TABLET | Freq: Once | ORAL | Status: DC
  Filled 2016-11-14: qty 1

## 2016-11-14 MED ORDER — LORAZEPAM 1 MG PO TABS
ORAL_TABLET | ORAL | Status: AC
  Filled 2016-11-14: qty 1

## 2016-11-14 MED ORDER — ALBUTEROL SULFATE (2.5 MG/3ML) 0.083% IN NEBU: 5.0000 mg | INHALATION_SOLUTION | Freq: Once | RESPIRATORY_TRACT | Status: DC

## 2016-11-14 MED ORDER — IOPAMIDOL (ISOVUE-370) INJECTION 76%
100.0000 mL | Freq: Once | INTRAVENOUS | Status: AC | PRN
  Administered 2016-11-14: 100 mL via INTRAVENOUS

## 2016-11-14 NOTE — ED Notes (Signed)
Per MRI, stated pt needs meds for anxiety prior to study. EDP aware

## 2016-11-14 NOTE — ED Provider Notes (Signed)
MSE was initiated and I personally evaluated the patient and placed orders (if any) at  4:47 PM on November 14, 2016.  The patient appears stable so that the remainder of the MSE may be completed by another provider.  Pt returns for persistent weakness in lower extremities, currently being treated for metastatic prostate CA.  Seen here ytd. Has taken increased dosing of percocet with no relief of pain.  Unable to walk today due to left leg weakness.  Also feels dehydrated and thinks he is constipated.  Last bm 7 days ago.  Labs, imaging ordered.  Will need acute side evaluation.   Evalee Jefferson, PA-C 11/14/16 1650    Merrily Pew, MD 11/15/16 [phone]

## 2016-11-14 NOTE — ED Provider Notes (Signed)
[facility] DEPT Provider Note   CSN: [id number] Arrival date & time: 11/14/16  1625     History   Chief Complaint Chief Complaint  Patient presents with  . Back Pain    HPI Ryan Ware is a 65 y.o. male.  HPI Patient is a very poor historian. Presents for thoracic back pain. States he fell trying to get out of bed which exacerbated his pain. He complains of ongoing left greater than right leg weakness. He's been taking oxycodone at home without relief of his symptoms. Was seen on 12/24 and had MRI lumbar spine. Patient has history of metastatic prostate cancer. There is known metastasis to the thoracic and lumbar spine. Past Medical History:  Diagnosis Date  . Cancer ([place])    Prostrate Cancer with mets  . Hepatitis C     There are no active problems to display for this patient.   Past Surgical History:  Procedure Laterality Date  . HAND SURGERY         Home Medications    Prior to Admission medications   Medication Sig Start Date End Date Taking? Authorizing Provider  acetaminophen (TYLENOL) 500 MG tablet Take 500 mg by mouth every 6 (six) hours as needed for mild pain or moderate pain.    Historical Provider, MD  OXYCODONE HCL PO Take 10 mg by mouth 3 (three) times daily.    Historical Provider, MD  oxyCODONE-acetaminophen (PERCOCET) 5-325 MG tablet Take 1-2 tablets by mouth every 4 (four) hours as needed. 11/09/16   Nat Christen, MD    Family History No family history on file.  Social History Social History  Substance Use Topics  . Smoking status: Current Every Day Smoker    Packs/day: 0.50    Types: Cigarettes  . Smokeless tobacco: Never Used  . Alcohol use Yes     Comment: occasional     Allergies   Patient has no known allergies.   Review of Systems Review of Systems  Constitutional: Negative for chills and fever.  Respiratory: Negative for shortness of breath.   Cardiovascular: Negative for chest pain.  Gastrointestinal: Positive  for constipation. Negative for abdominal pain, nausea and vomiting.  Genitourinary: Negative for difficulty urinating, dysuria and frequency.  Musculoskeletal: Positive for back pain. Negative for myalgias, neck pain and neck stiffness.  Neurological: Positive for weakness. Negative for dizziness, syncope, light-headedness and headaches.  All other systems reviewed and are negative.    Physical Exam Updated Vital Signs BP 148/79 (BP Location: Left Arm)   Pulse (!) 121 Comment: pt just stood up from ems stretcher   Temp 98.1 F (36.7 C) (Oral)   Resp 18   Ht 6\' 5"  (1.956 m)   Wt 200 lb (90.7 kg)   SpO2 99%   BMI 23.72 kg/m   Physical Exam  Constitutional: He is oriented to person, place, and time. He appears well-developed and well-nourished. No distress.  HENT:  Head: Normocephalic and atraumatic.  Mouth/Throat: Oropharynx is clear and moist.  Eyes: EOM are normal. Pupils are equal, round, and reactive to light.  Neck: Normal range of motion. Neck supple.  Cardiovascular: Normal rate and regular rhythm.  Exam reveals no gallop and no friction rub.   No murmur heard. Pulmonary/Chest: Effort normal and breath sounds normal. No respiratory distress. He has no wheezes. He has no rales. He exhibits no tenderness.  Abdominal: Soft. Bowel sounds are normal. There is no tenderness. There is no rebound and no guarding.  Musculoskeletal: Normal range  of motion. He exhibits tenderness. He exhibits no edema.  Neurological: He is alert and oriented to person, place, and time.  Skin: Skin is warm and dry. No rash noted. No erythema.  Psychiatric: He has a normal mood and affect. His behavior is normal.  Nursing note and vitals reviewed.    ED Treatments / Results  Labs (all labs ordered are listed, but only abnormal results are displayed) Labs Reviewed  CBC WITH DIFFERENTIAL/PLATELET  URINALYSIS, ROUTINE W REFLEX MICROSCOPIC    EKG  EKG Interpretation None       Radiology No  results found.  Procedures Procedures (including critical care time)  Medications Ordered in ED Medications - No data to display   Initial Impression / Assessment and Plan / ED Course  I have reviewed the triage vital signs and the nursing notes.  Pertinent labs & imaging results that were available during my care of the patient were reviewed by me and considered in my medical decision making (see chart for details).  Clinical Course    Patient is refusing MRI. We'll get CT to evaluate for previously seen thoracic mass as well as rule out PE.  Final Clinical Impressions(s) / ED Diagnoses   Final diagnoses:  Thoracic back pain   Patient is resting comfortably. No distress. CT angiogram with evidence of metastatic disease but no evidence of cord compression. Patient witnessed moving leg does not appear to have any focal weakness. Asking for pain medication. Given dose of Percocet will discharge home to follow-up with his doctors at the [place]. Return precautions have been given. New Prescriptions New Prescriptions   No medications on file     Julianne Rice, MD 11/14/16 2112

## 2016-11-14 NOTE — ED Notes (Signed)
Lab called about CBC.

## 2016-11-14 NOTE — ED Triage Notes (Signed)
Pt returns, treated here yesterday for back pain after a fall.

## 2016-11-14 NOTE — ED Notes (Signed)
Rt aware of tx 

## 2016-11-14 NOTE — Discharge Instructions (Signed)
Follow-up with your urologist and oncologist. Take pain medications as previously prescribed.

## 2016-12-18 ENCOUNTER — Encounter (HOSPITAL_COMMUNITY): Payer: Self-pay | Admitting: *Deleted

## 2016-12-18 ENCOUNTER — Inpatient Hospital Stay (HOSPITAL_COMMUNITY)
Admission: EM | Admit: 2016-12-18 | Discharge: 2016-12-21 | DRG: 091 | Disposition: A | Discharge status: Home Health | Payer: BLUE CROSS/BLUE SHIELD | Attending: Internal Medicine | Admitting: Internal Medicine

## 2016-12-18 ENCOUNTER — Emergency Department (HOSPITAL_COMMUNITY): Payer: BLUE CROSS/BLUE SHIELD

## 2016-12-18 DIAGNOSIS — Z888 Allergy status to other drugs, medicaments and biological substances status: Secondary | ICD-10-CM

## 2016-12-18 DIAGNOSIS — C7951 Secondary malignant neoplasm of bone: Secondary | ICD-10-CM | POA: Diagnosis present

## 2016-12-18 DIAGNOSIS — R202 Paresthesia of skin: Secondary | ICD-10-CM | POA: Diagnosis present

## 2016-12-18 DIAGNOSIS — Z79899 Other long term (current) drug therapy: Secondary | ICD-10-CM

## 2016-12-18 DIAGNOSIS — G8190 Hemiplegia, unspecified affecting unspecified side: Secondary | ICD-10-CM | POA: Diagnosis present

## 2016-12-18 DIAGNOSIS — T451X5A Adverse effect of antineoplastic and immunosuppressive drugs, initial encounter: Secondary | ICD-10-CM | POA: Diagnosis present

## 2016-12-18 DIAGNOSIS — R64 Cachexia: Secondary | ICD-10-CM | POA: Diagnosis present

## 2016-12-18 DIAGNOSIS — F17210 Nicotine dependence, cigarettes, uncomplicated: Secondary | ICD-10-CM | POA: Diagnosis present

## 2016-12-18 DIAGNOSIS — T402X5A Adverse effect of other opioids, initial encounter: Secondary | ICD-10-CM | POA: Diagnosis present

## 2016-12-18 DIAGNOSIS — E871 Hypo-osmolality and hyponatremia: Secondary | ICD-10-CM | POA: Diagnosis present

## 2016-12-18 DIAGNOSIS — G9520 Unspecified cord compression: Secondary | ICD-10-CM | POA: Diagnosis present

## 2016-12-18 DIAGNOSIS — K5903 Drug induced constipation: Secondary | ICD-10-CM | POA: Diagnosis present

## 2016-12-18 DIAGNOSIS — C61 Malignant neoplasm of prostate: Secondary | ICD-10-CM | POA: Diagnosis present

## 2016-12-18 DIAGNOSIS — C729 Malignant neoplasm of central nervous system, unspecified: Secondary | ICD-10-CM | POA: Diagnosis present

## 2016-12-18 DIAGNOSIS — M549 Dorsalgia, unspecified: Secondary | ICD-10-CM

## 2016-12-18 DIAGNOSIS — D492 Neoplasm of unspecified behavior of bone, soft tissue, and skin: Secondary | ICD-10-CM | POA: Diagnosis present

## 2016-12-18 DIAGNOSIS — E43 Unspecified severe protein-calorie malnutrition: Secondary | ICD-10-CM | POA: Diagnosis present

## 2016-12-18 DIAGNOSIS — D6481 Anemia due to antineoplastic chemotherapy: Secondary | ICD-10-CM | POA: Diagnosis present

## 2016-12-18 DIAGNOSIS — M6281 Muscle weakness (generalized): Secondary | ICD-10-CM

## 2016-12-18 DIAGNOSIS — G959 Disease of spinal cord, unspecified: Principal | ICD-10-CM | POA: Diagnosis present

## 2016-12-18 DIAGNOSIS — D65 Disseminated intravascular coagulation [defibrination syndrome]: Secondary | ICD-10-CM | POA: Diagnosis present

## 2016-12-18 DIAGNOSIS — R262 Difficulty in walking, not elsewhere classified: Secondary | ICD-10-CM

## 2016-12-18 DIAGNOSIS — M479 Spondylosis, unspecified: Secondary | ICD-10-CM | POA: Diagnosis present

## 2016-12-18 DIAGNOSIS — B1920 Unspecified viral hepatitis C without hepatic coma: Secondary | ICD-10-CM | POA: Diagnosis present

## 2016-12-18 LAB — BASIC METABOLIC PANEL
Anion gap: 12 (ref 5–15)
BUN: 7 mg/dL (ref 6–20)
CALCIUM: 8.1 mg/dL — AB (ref 8.9–10.3)
CO2: 23 mmol/L (ref 22–32)
CREATININE: 0.45 mg/dL — AB (ref 0.61–1.24)
Chloride: 99 mmol/L — ABNORMAL LOW (ref 101–111)
Glucose, Bld: 100 mg/dL — ABNORMAL HIGH (ref 65–99)
Potassium: 3.6 mmol/L (ref 3.5–5.1)
Sodium: 134 mmol/L — ABNORMAL LOW (ref 135–145)

## 2016-12-18 LAB — CBC WITH DIFFERENTIAL/PLATELET
Basophils Absolute: 0.1 10*3/uL (ref 0.0–0.1)
Basophils Relative: 1 %
EOS ABS: 0.1 10*3/uL (ref 0.0–0.7)
EOS PCT: 1 %
HEMATOCRIT: 21.6 % — AB (ref 39.0–52.0)
Hemoglobin: 6.8 g/dL — CL (ref 13.0–17.0)
LYMPHS ABS: 2.8 10*3/uL (ref 0.7–4.0)
Lymphocytes Relative: 33 %
MCH: 26.2 pg (ref 26.0–34.0)
MCHC: 31.5 g/dL (ref 30.0–36.0)
MCV: 83.1 fL (ref 78.0–100.0)
MONO ABS: 0.8 10*3/uL (ref 0.1–1.0)
Monocytes Relative: 9 %
Neutro Abs: 4.8 10*3/uL (ref 1.7–7.7)
Neutrophils Relative %: 56 %
PLATELETS: 103 10*3/uL — AB (ref 150–400)
RBC: 2.6 MIL/uL — AB (ref 4.22–5.81)
RDW: 18.4 % — AB (ref 11.5–15.5)
WBC: 8.6 10*3/uL (ref 4.0–10.5)

## 2016-12-18 MED ORDER — HYDROMORPHONE HCL 2 MG/ML IJ SOLN
1.0000 mg | Freq: Once | INTRAMUSCULAR | Status: AC
  Administered 2016-12-18: 1 mg via INTRAVENOUS
  Filled 2016-12-18: qty 1

## 2016-12-18 NOTE — ED Triage Notes (Signed)
PT reports he hurts from base of neck to the tail  Bone . Pt reports he can not feel his buttocks and both  Legs and feet are numb.

## 2016-12-18 NOTE — ED Notes (Signed)
Delay in lab draw,  Pt not in room at this time. 

## 2016-12-18 NOTE — ED Provider Notes (Addendum)
[facility] DEPT Provider Note   CSN: [id number] Arrival date & time: 12/18/16  1752     History   Chief Complaint Chief Complaint  Patient presents with  . Back Pain    HPI Ryan Ware is a 66 y.o. male.  The history is provided by the patient. No language interpreter was used.   Ryan Ware is a 66 y.o. male who presents to the Emergency Department complaining of back pain.  He presents as a referral from the [facility] for evaluation of back pain. He has a history of metastatic prostate cancer and has experienced progressive back pain from his lower to upper C-spine for the last 6 months. He hass experienced burning pain radiating down bilateral legs for the last several months with associated difficulties with urination difficulties walking. He was diagnosed with prostate cancer on 01/16/2016. On 05/21/2016 numerous spine metastases were identified. On August 9 he was given Lupron. On 10/01/2016 he had a weight loss and increased pain with new leg weakness he never had neurosurgery evaluation at that time. On December 24 of 2017 he had an MRI lumbar spine that showed metastases with no axial examination apparently done because he could not tolerate it. 11/14/2016 he had a CT thorax with multiple lung lesions.  Past Medical History:  Diagnosis Date  . Cancer ([place])    Prostrate Cancer with mets  . Hepatitis C     There are no active problems to display for this patient.   Past Surgical History:  Procedure Laterality Date  . HAND SURGERY         Home Medications    Prior to Admission medications   Medication Sig Start Date End Date Taking? Authorizing Provider  oxyCODONE-acetaminophen (PERCOCET) 5-325 MG tablet Take 1-2 tablets by mouth every 4 (four) hours as needed. Patient taking differently: Take 2 tablets by mouth every 4 (four) hours as needed.  11/09/16  Yes Nat Christen, MD  polyethylene glycol [facility] / GLYCOLAX) packet Take 17 g by mouth  daily as needed.   Yes Historical Provider, MD    Family History History reviewed. No pertinent family history.  Social History Social History  Substance Use Topics  . Smoking status: Current Every Day Smoker    Packs/day: 0.50    Types: Cigarettes  . Smokeless tobacco: Never Used  . Alcohol use Yes     Comment: occasional     Allergies   Acetaminophen   Review of Systems Review of Systems  All other systems reviewed and are negative.    Physical Exam Updated Vital Signs BP 119/81   Pulse 98   Temp 99.2 F (37.3 C) (Oral)   Resp 18   Ht 6' 5.5" (1.969 m)   SpO2 97%   Physical Exam  Constitutional: He is oriented to person, place, and time. He appears well-developed and well-nourished.  thin  HENT:  Head: Normocephalic and atraumatic.  Cardiovascular: Regular rhythm.   No murmur heard. tachycardic  Pulmonary/Chest: Effort normal and breath sounds normal. No respiratory distress.  Abdominal: Soft. There is no tenderness. There is no rebound and no guarding.  Musculoskeletal: He exhibits no edema or tenderness.  Neurological: He is alert and oriented to person, place, and time.  Decreased sensation to light touch in BLE.  No saddle anesthesia.  No ankle clonus.  1+ left patellar reflex.  Absent right patellar reflex.  Downgoing toes bilaterally.  3/5 strength in LLE.  4/5 strength in RLE.  5/5 strength in  BUE.    Skin: Skin is warm and dry.  Psychiatric: He has a normal mood and affect. His behavior is normal.  Nursing note and vitals reviewed.    ED Treatments / Results  Labs (all labs ordered are listed, but only abnormal results are displayed) Labs Reviewed  BASIC METABOLIC PANEL - Abnormal; Notable for the following:       Result Value   Sodium 134 (*)    Chloride 99 (*)    Glucose, Bld 100 (*)    Creatinine, Ser 0.45 (*)    Calcium 8.1 (*)    All other components within normal limits  CBC WITH DIFFERENTIAL/PLATELET - Abnormal; Notable for the  following:    RBC 2.60 (*)    Hemoglobin 6.8 (*)    HCT 21.6 (*)    RDW 18.4 (*)    Platelets 103 (*)    All other components within normal limits  PROTIME-INR  VITAMIN B12  FOLATE  IRON AND TIBC  FERRITIN  RETICULOCYTES  POC OCCULT BLOOD, ED  TYPE AND SCREEN    EKG  EKG Interpretation None       Radiology No results found.  Procedures Procedures (including critical care time)  Medications Ordered in ED Medications  HYDROmorphone (DILAUDID) injection 1 mg (1 mg Intravenous Given 12/18/16 2059)  HYDROmorphone (DILAUDID) injection 1 mg (1 mg Intravenous Given 12/18/16 2326)     Initial Impression / Assessment and Plan / ED Course  I have reviewed the triage vital signs and the nursing notes.  Pertinent labs & imaging results that were available during my care of the patient were reviewed by me and considered in my medical decision making (see chart for details).     Patient with history of metastatic prostate cancer here for progressive back pain as well as paresthesias and weakness of bilateral lower extremities. He does have some deficits on examination. Patient care transferred pending MRI.   Final Clinical Impressions(s) / ED Diagnoses   Final diagnoses:  Back pain  Back pain    New Prescriptions New Prescriptions   No medications on file     Quintella Reichert, MD 12/19/16 0028    Quintella Reichert, MD 12/19/16 [phone]

## 2016-12-18 NOTE — ED Notes (Signed)
Pt in MRI.

## 2016-12-18 NOTE — ED Notes (Signed)
MRI staff called to inform this nurse about pt pain while in MRI.

## 2016-12-18 NOTE — ED Notes (Signed)
Patient transported to MRI 

## 2016-12-18 NOTE — ED Triage Notes (Signed)
To ED via [facility] for eval of back pain that started 6 months ago. Pt was seen at [facility] in [place] and sent to [facility] for eval of possible 'cord compression'. Per ems, pt is ambulatory but 'barely'. Pt is under care of Oncology for prostate ca

## 2016-12-19 ENCOUNTER — Encounter (HOSPITAL_COMMUNITY): Payer: Self-pay | Admitting: Family Medicine

## 2016-12-19 DIAGNOSIS — G959 Disease of spinal cord, unspecified: Secondary | ICD-10-CM | POA: Diagnosis present

## 2016-12-19 DIAGNOSIS — C7951 Secondary malignant neoplasm of bone: Secondary | ICD-10-CM

## 2016-12-19 DIAGNOSIS — M549 Dorsalgia, unspecified: Secondary | ICD-10-CM | POA: Diagnosis present

## 2016-12-19 DIAGNOSIS — G9520 Unspecified cord compression: Secondary | ICD-10-CM | POA: Diagnosis not present

## 2016-12-19 DIAGNOSIS — B1920 Unspecified viral hepatitis C without hepatic coma: Secondary | ICD-10-CM | POA: Diagnosis present

## 2016-12-19 DIAGNOSIS — Z888 Allergy status to other drugs, medicaments and biological substances status: Secondary | ICD-10-CM | POA: Diagnosis not present

## 2016-12-19 DIAGNOSIS — M479 Spondylosis, unspecified: Secondary | ICD-10-CM | POA: Diagnosis present

## 2016-12-19 DIAGNOSIS — C61 Malignant neoplasm of prostate: Secondary | ICD-10-CM | POA: Diagnosis present

## 2016-12-19 DIAGNOSIS — E43 Unspecified severe protein-calorie malnutrition: Secondary | ICD-10-CM | POA: Diagnosis present

## 2016-12-19 DIAGNOSIS — C729 Malignant neoplasm of central nervous system, unspecified: Secondary | ICD-10-CM | POA: Diagnosis present

## 2016-12-19 DIAGNOSIS — D6481 Anemia due to antineoplastic chemotherapy: Secondary | ICD-10-CM | POA: Diagnosis present

## 2016-12-19 DIAGNOSIS — K5903 Drug induced constipation: Secondary | ICD-10-CM | POA: Diagnosis present

## 2016-12-19 DIAGNOSIS — B182 Chronic viral hepatitis C: Secondary | ICD-10-CM | POA: Diagnosis not present

## 2016-12-19 DIAGNOSIS — E871 Hypo-osmolality and hyponatremia: Secondary | ICD-10-CM | POA: Diagnosis present

## 2016-12-19 DIAGNOSIS — T451X5A Adverse effect of antineoplastic and immunosuppressive drugs, initial encounter: Secondary | ICD-10-CM | POA: Diagnosis present

## 2016-12-19 DIAGNOSIS — D65 Disseminated intravascular coagulation [defibrination syndrome]: Secondary | ICD-10-CM | POA: Diagnosis present

## 2016-12-19 DIAGNOSIS — R64 Cachexia: Secondary | ICD-10-CM | POA: Diagnosis present

## 2016-12-19 DIAGNOSIS — T402X5A Adverse effect of other opioids, initial encounter: Secondary | ICD-10-CM | POA: Diagnosis present

## 2016-12-19 DIAGNOSIS — Z79899 Other long term (current) drug therapy: Secondary | ICD-10-CM | POA: Diagnosis not present

## 2016-12-19 DIAGNOSIS — R202 Paresthesia of skin: Secondary | ICD-10-CM | POA: Diagnosis present

## 2016-12-19 DIAGNOSIS — D492 Neoplasm of unspecified behavior of bone, soft tissue, and skin: Secondary | ICD-10-CM | POA: Diagnosis present

## 2016-12-19 DIAGNOSIS — F17210 Nicotine dependence, cigarettes, uncomplicated: Secondary | ICD-10-CM | POA: Diagnosis present

## 2016-12-19 DIAGNOSIS — G8190 Hemiplegia, unspecified affecting unspecified side: Secondary | ICD-10-CM | POA: Diagnosis present

## 2016-12-19 LAB — RETICULOCYTES
RBC.: 2.76 MIL/uL — ABNORMAL LOW (ref 4.22–5.81)
RETIC COUNT ABSOLUTE: 107.6 10*3/uL (ref 19.0–186.0)
Retic Ct Pct: 3.9 % — ABNORMAL HIGH (ref 0.4–3.1)

## 2016-12-19 LAB — POC OCCULT BLOOD, ED: Fecal Occult Bld: NEGATIVE

## 2016-12-19 LAB — PROTIME-INR
INR: 1.4
Prothrombin Time: 17.2 seconds — ABNORMAL HIGH (ref 11.4–15.2)

## 2016-12-19 LAB — HEPATIC FUNCTION PANEL
ALK PHOS: 269 U/L — AB (ref 38–126)
ALT: 35 U/L (ref 17–63)
AST: 92 U/L — ABNORMAL HIGH (ref 15–41)
Albumin: 1.9 g/dL — ABNORMAL LOW (ref 3.5–5.0)
BILIRUBIN TOTAL: 0.7 mg/dL (ref 0.3–1.2)
Bilirubin, Direct: 0.3 mg/dL (ref 0.1–0.5)
Indirect Bilirubin: 0.4 mg/dL (ref 0.3–0.9)
TOTAL PROTEIN: 6.5 g/dL (ref 6.5–8.1)

## 2016-12-19 LAB — ABO/RH: ABO/RH(D): B POS

## 2016-12-19 LAB — PREPARE RBC (CROSSMATCH)

## 2016-12-19 LAB — VITAMIN B12: Vitamin B-12: 499 pg/mL (ref 180–914)

## 2016-12-19 LAB — IRON AND TIBC
IRON: 52 ug/dL (ref 45–182)
Saturation Ratios: 29 % (ref 17.9–39.5)
TIBC: 176 ug/dL — AB (ref 250–450)
UIBC: 124 ug/dL

## 2016-12-19 LAB — FERRITIN: Ferritin: 3578 ng/mL — ABNORMAL HIGH (ref 24–336)

## 2016-12-19 LAB — LACTATE DEHYDROGENASE: LDH: 1105 U/L — ABNORMAL HIGH (ref 98–192)

## 2016-12-19 LAB — APTT: aPTT: 51 seconds — ABNORMAL HIGH (ref 24–36)

## 2016-12-19 LAB — FOLATE: Folate: 10.4 ng/mL (ref >5.9)

## 2016-12-19 LAB — DIRECT ANTIGLOBULIN TEST (NOT AT ARMC)
DAT, IgG: NEGATIVE
DAT, complement: NEGATIVE

## 2016-12-19 LAB — PATHOLOGIST SMEAR REVIEW

## 2016-12-19 LAB — D-DIMER, QUANTITATIVE: D-Dimer, Quant: >20 ug/mL-FEU — ABNORMAL HIGH (ref 0.00–0.50)

## 2016-12-19 LAB — FIBRINOGEN: FIBRINOGEN: 244 mg/dL (ref 210–475)

## 2016-12-19 MED ORDER — HYDROMORPHONE HCL 2 MG/ML IJ SOLN
1.0000 mg | INTRAMUSCULAR | Status: DC | PRN
  Administered 2016-12-19 (×2): 1 mg via INTRAVENOUS
  Filled 2016-12-19 (×2): qty 1

## 2016-12-19 MED ORDER — POLYETHYLENE GLYCOL 3350 17 G PO PACK
17.0000 g | PACK | Freq: Two times a day (BID) | ORAL | Status: DC
  Administered 2016-12-19 – 2016-12-21 (×4): 17 g via ORAL
  Filled 2016-12-19 (×5): qty 1

## 2016-12-19 MED ORDER — FUROSEMIDE 10 MG/ML IJ SOLN
20.0000 mg | Freq: Once | INTRAMUSCULAR | Status: AC
  Administered 2016-12-19: 20 mg via INTRAVENOUS
  Filled 2016-12-19: qty 2

## 2016-12-19 MED ORDER — SODIUM CHLORIDE 0.9 % IV SOLN
Freq: Once | INTRAVENOUS | Status: AC
  Administered 2016-12-19: 05:00:00 via INTRAVENOUS

## 2016-12-19 MED ORDER — SODIUM CHLORIDE 0.9 % IV SOLN
Freq: Once | INTRAVENOUS | Status: AC
  Administered 2016-12-19: 13:00:00 via INTRAVENOUS

## 2016-12-19 MED ORDER — ENSURE ENLIVE PO LIQD
237.0000 mL | Freq: Two times a day (BID) | ORAL | Status: DC
  Administered 2016-12-19: 237 mL via ORAL

## 2016-12-19 MED ORDER — DEXAMETHASONE SODIUM PHOSPHATE 10 MG/ML IJ SOLN
4.0000 mg | Freq: Four times a day (QID) | INTRAMUSCULAR | Status: DC
  Administered 2016-12-19 – 2016-12-21 (×8): 4 mg via INTRAVENOUS
  Filled 2016-12-19 (×7): qty 1

## 2016-12-19 MED ORDER — ENSURE ENLIVE PO LIQD
237.0000 mL | Freq: Three times a day (TID) | ORAL | Status: DC
  Administered 2016-12-19 – 2016-12-21 (×6): 237 mL via ORAL

## 2016-12-19 MED ORDER — OXYCODONE-ACETAMINOPHEN 5-325 MG PO TABS
2.0000 | ORAL_TABLET | ORAL | Status: DC | PRN
  Administered 2016-12-19 – 2016-12-21 (×4): 2 via ORAL
  Filled 2016-12-19 (×4): qty 2

## 2016-12-19 MED ORDER — GADOBENATE DIMEGLUMINE 529 MG/ML IV SOLN
20.0000 mL | Freq: Once | INTRAVENOUS | Status: AC | PRN
  Administered 2016-12-19: 20 mL via INTRAVENOUS

## 2016-12-19 MED ORDER — DEXAMETHASONE SODIUM PHOSPHATE 10 MG/ML IJ SOLN
10.0000 mg | Freq: Once | INTRAMUSCULAR | Status: AC
  Administered 2016-12-19: 10 mg via INTRAVENOUS
  Filled 2016-12-19: qty 1

## 2016-12-19 NOTE — ED Provider Notes (Signed)
Care assumed from Dr. Ralene Bathe at shift change.  Patient with metastatic prostate cancer waiting MRI spine to evaluate for back pain and leg weakness.  MRI findings d/w Dr. Ellene Route. 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  He will review images. Recommends IV decadron q6h.  Patient with new anemia.  Hemoglobin 6.8, was 12 in December.  FOBT negative. Anemia panel sent.  Admission d/w Dr. Loleta Books.    Ezequiel Essex, MD 12/19/16 [phone]

## 2016-12-19 NOTE — Progress Notes (Signed)
PROGRESS NOTE  ABDULLA SARRACINO  [id number] DOB: 04-Nov-1951 DOA: 12/18/2016 PCP: No PCP Per Patient Outpatient Specialists:  Subjective: Patient is a very poor historian given the multiple stories and flips between complaints. Reported has numbness (tingling) in his buttocks and legs.  Brief Narrative:  NOEY STEINES is a 66 y.o. male with a past medical history significant for metastatic prostate cancer, hepatitis C who presents with leg weakness from his Urologist's office, concern for cord compression.  The patient has metastatic prostate cancer, planning to start palliative chemotherapy with [facility] in [place] soon.  Evidently the patient went to his oncology office at the [facility] a week or 2 ago for follow-up of his prostate cancer, was noted to have tachycardia and inability to move legs, was sent to the emergency room for evaluation for cord compression. He went to [facility] ED where they thought he was able to walk, performed no evaluation for cord compression and just gave him an antibiotic for UTI and discharged him. He did not get the antibiotic.  Today he went back to his oncologist, he had saddle paresthesias that had progressed, continued severe pain in the back, numbness in his buttocks and legs, and weakness with walking. He was referred again to the emergency room, this time at [facility], for evaluation of cord compression.  Assessment & Plan:   Principal Problem:   Cord compression [facility]) Active Problems:   Prostate cancer metastatic to bone (HCC)   Hyponatremia   Hepatitis C without hepatic coma   This is charge note, patient seen earlier today by [facility] Dr. Loleta Books. Widely metastatic prostate cancer with spinal and vertebral involvement, emaciated week patient. Did complain about constipation, he had to do manual soft disimpaction, will place a laxatives. Anemia, negative fecal occult blood, elevated d-dimer, question smoldering DIC versus anemia  related to myelopathy. Transfuse 2 units of pRBC. Discussed with Dr. Alvy Bimler, elevated d-dimer can be secondary to smoldering DIC versus malignancy itself.  1. Myelopathy:  Unclear to me based on MRI report if this cord compression is related to tumor versus other degenerative changes to the bone, which is what the report seems to suggest. -Dexamethasone 4 mg q6 -Consult to Neurosurgery, appreciate recommendations  2. Anemia:  Unclear cause.  He has vague reports of rectal bleeding, but this is scant, and after self-disimpaction.  No melena.  Not really enough hematuria to explain this loss of Hgb.  Smoldering DIC a concern of Heme-Onc fellow. -Transfuse 1 unit now, repeat CBC after -Will check haptoglobin, LDH, bilirubin, coombs first -Check smear, fibrinogen, dimer -FOBT ordered  3. Hepatitis C:  -Check Hep C RNA  4. Other:  -Consult to SW for assistance with food at home, given his developing disability  5. Prostate cancer:  -Continue oxycodone -Continue MiraLAX, increase to BID -If no BM in 2 days, may need suppository -I/O cath PRN     DVT prophylaxis:  Code Status: Full Code Family Communication:  Disposition Plan:  Diet: Diet regular Room service appropriate? Yes; Fluid consistency: Thin  Consultants:   Neurosurgery  Procedures:   None  Antimicrobials:   None  Objective: Vitals:   12/19/16 0315 12/19/16 0449 12/19/16 0538 12/19/16 0555  BP: 122/79 126/85 113/64 118/60  Pulse: 95 95 (!) 104 100  Resp: 17 16 14 14   Temp:  98.2 F (36.8 C) 99.1 F (37.3 C) 99 F (37.2 C)  TempSrc:  Oral Oral Oral  SpO2: 98% 99% 99% 97%  Height:  Intake/Output Summary (Last 24 hours) at 12/19/16 1212 Last data filed at 12/19/16 0900  Gross per 24 hour  Intake              625 ml  Output              400 ml  Net              225 ml   There were no vitals filed for this visit.  Examination: General exam: Appears calm and comfortable  Respiratory  system: Clear to auscultation. Respiratory effort normal. Cardiovascular system: S1 & S2 heard, RRR. No JVD, murmurs, rubs, gallops or clicks. No pedal edema. Gastrointestinal system: Abdomen is nondistended, soft and nontender. No organomegaly or masses felt. Normal bowel sounds heard. Central nervous system: Alert and oriented. No focal neurological deficits. Extremities: Symmetric 5 x 5 power. Skin: No rashes, lesions or ulcers Psychiatry: Judgement and insight appear normal. Mood & affect appropriate.   Data Reviewed: I have personally reviewed following labs and imaging studies  CBC:  Recent Labs Lab 12/18/16 2111  WBC 8.6  NEUTROABS 4.8  HGB 6.8*  HCT 21.6*  MCV 83.1  PLT [id number]*   Basic Metabolic Panel:  Recent Labs Lab 12/18/16 2111  NA 134*  K 3.6  CL 99*  CO2 23  GLUCOSE 100*  BUN 7  CREATININE 0.45*  CALCIUM 8.1*   GFR: CrCl cannot be calculated (Unknown ideal weight.). Liver Function Tests:  Recent Labs Lab 12/19/16 0923  AST 92*  ALT 35  ALKPHOS 269*  BILITOT 0.7  PROT 6.5  ALBUMIN 1.9*   No results for input(s): LIPASE, AMYLASE in the last 168 hours. No results for input(s): AMMONIA in the last 168 hours. Coagulation Profile:  Recent Labs Lab 12/19/16 0050  INR 1.40   Cardiac Enzymes: No results for input(s): CKTOTAL, CKMB, CKMBINDEX, TROPONINI in the last 168 hours. BNP (last 3 results) No results for input(s): PROBNP in the last 8760 hours. HbA1C: No results for input(s): HGBA1C in the last 72 hours. CBG: No results for input(s): GLUCAP in the last 168 hours. Lipid Profile: No results for input(s): CHOL, HDL, LDLCALC, TRIG, CHOLHDL, LDLDIRECT in the last 72 hours. Thyroid Function Tests: No results for input(s): TSH, T4TOTAL, FREET4, T3FREE, THYROIDAB in the last 72 hours. Anemia Panel:  Recent Labs  12/19/16 0050  VITAMINB12 499  FOLATE 10.4  FERRITIN 3,578*  TIBC 176*  IRON 52  RETICCTPCT 3.9*   Urine analysis:      Component Value Date/Time   COLORURINE AMBER (A) 02/21/2016 1809   APPEARANCEUR HAZY (A) 02/21/2016 1809   LABSPEC 1.025 02/21/2016 1809   PHURINE 5.0 02/21/2016 1809   GLUCOSEU NEGATIVE 02/21/2016 1809   HGBUR LARGE (A) 02/21/2016 1809   BILIRUBINUR SMALL (A) 02/21/2016 1809   KETONESUR NEGATIVE 02/21/2016 1809   PROTEINUR 100 (A) 02/21/2016 1809   NITRITE NEGATIVE 02/21/2016 1809   LEUKOCYTESUR TRACE (A) 02/21/2016 1809   Sepsis Labs: @LABRCNTIP (procalcitonin:4,lacticidven:4)  )No results found for this or any previous visit (from the past 240 hour(s)).   Invalid input(s): PROCALCITONIN, LACTICACIDVEN   Radiology Studies: Mr Cervical Spine W Wo Contrast  Result Date: 12/19/2016 CLINICAL DATA:  Progressive back pain for 6 months, burning sensation to lower extremities for several months. History of metastatic prostate cancer. EXAM: MRI TOTAL SPINE WITHOUT AND WITH CONTRAST TECHNIQUE: Multisequence multiplanar MR imaging of the spine from the cervical spine to the sacrum was performed prior to and following IV contrast administration  for evaluation of spinal metastatic disease. CONTRAST:  40mL MULTIHANCE GADOBENATE DIMEGLUMINE 529 MG/ML IV SOLN COMPARISON:  MRI of the lumbar spine November 09, 2016 FINDINGS: MRI CERVICAL SPINE FINDINGS ALIGNMENT: Straightened cervical lordosis.  No malalignment. VERTEBRAE/DISCS: Vertebral bodies are intact. Severe C3-4, moderate C4-5, moderate C5-6, moderate to severe C6-7, severe 7 C7-T1 disc height loss with moderate multilevel chronic discogenic endplate changes. No STIR signal abnormality to suggest acute fracture. Diffusely low bone marrow signal with multiple low T1, bright STIR enhancing metastasis throughout the cervical spine. Heterogeneous calvarium consistent with metastatic disease. CORD:Subcentimeter focus of T2 bright signal within the LEFT greater than RIGHT cervical spinal cord at C4 associated with cord compression. No syrinx, no abnormal  cord, leptomeningeal or epidural enhancement. POSTERIOR FOSSA, VERTEBRAL ARTERIES, PARASPINAL TISSUES: No MR findings of ligamentous injury. Vertebral artery flow voids present. Included posterior fossa and paraspinal soft tissues are nonsuspicious. DISC LEVELS: C2-3: Small broad-based disc bulge asymmetric to the RIGHT. Uncovertebral hypertrophy. Severe RIGHT, mild LEFT facet arthropathy. No canal stenosis. Severe RIGHT, mild LEFT neural foraminal narrowing. C3-4: 2 mm broad-based disc bulge, uncovertebral hypertrophy mild facet arthropathy. Severe canal stenosis, AP dimension of the canal is 6 mm. Severe bilateral neural foraminal narrowing. C4-5: Small broad-based disc bulge, uncovertebral hypertrophy and mild facet arthropathy. No canal stenosis. Severe bilateral neural foraminal narrowing. C5-6: Small broad-based disc bulge, uncovertebral hypertrophy and mild facet arthropathy. No canal stenosis. Moderate RIGHT, and moderate to severe LEFT neural foraminal narrowing. C6-7: Small broad-based disc bulge, uncovertebral hypertrophy and mild facet arthropathy. No canal stenosis. Moderate bilateral neural foraminal narrowing. C7-T1: Small broad-based disc bulge, uncovertebral hypertrophy mild facet arthropathy. No canal stenosis. Moderate to severe bilateral neural foraminal narrowing. MRI THORACIC SPINE FINDINGS ALIGNMENT: Maintenance of the thoracic kyphosis. No malalignment. VERTEBRAE/DISCS: Diffusely low bone marrow signal with multiple low T1, bright STIR enhancing metastasis throughout the thoracic spine. Tumoral expansion of T4, T5, T6 and T7 middle column. Tumoral expansion RIGHT T7 pedicle and facet, LEFT T8 pedicle. Tumoral invasion of the RIGHT epidural space from T5-6 2 T7-8, extending into the neural foramen, RIGHT lateral posterior pleural space and paraspinal soft tissues, RIGHT T7 rib. No fracture deformity. Metastasis in all included ribs, including multifocal tumoral expansion. CORD: Multilevel cord  compression, however are no cord edema. No abnormal cord, leptomeningeal enhancement. PREVERTEBRAL AND PARASPINAL SOFT TISSUES:  Nonacute. DISC LEVELS: At T6-7 and T7 RIGHT pedicle and epidural tumor effaces the thecal sac and mildly deforms the spinal cord. Mild canal stenosis. Tumor completely effaces the RIGHT C6-7 neural foramen. Tumor mildly narrows the RIGHT T7-8 neural foramen. Lower lumbar small broad-based disc bulges and facet arthropathy. Mild-to-moderate T9-10 neural foraminal narrowing. MRI LUMBAR SPINE FINDINGS- moderately motion degraded examination. SEGMENTATION: For the purposes of this report, the last well-formed intervertebral disc will be described as L5-S1. ALIGNMENT: Maintenance of the lumbar lordosis. No malalignment. VERTEBRAE:Diffusely low bone marrow signal with multiple low T1, bright STIR enhancing metastasis throughout the lumbar spine. No pathologic fracture. Moderate to severe L5-S1 disc height loss. Multilevel mild desiccation. Moderate to severe chronic discogenic endplate changes 075-GRM, mild at the remaining lumbar levels. Expansile tumor RIGHT iliac bone invading the gluteal and paraspinal muscles. Congenital canal narrowing on the basis of foreshortened pedicles. Epidural lipomatosis. CONUS MEDULLARIS: Conus medullaris terminates at L1-2 and demonstrates normal morphology and signal characteristics. 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 No abnormal cord, leptomeningeal enhancement. No epidural tumor. PARASPINAL AND SOFT TISSUES: Paraspinal muscle bright interstitial STIR signal and  enhancement most consistent with denervation. 2 cm gallstone. DISC LEVELS: L1-2: Annular bulging. Moderate facet arthropathy and ligamentum flavum redundancy without canal stenosis or neural foraminal narrowing. L2-3: 3 mm broad-based disc bulge. Moderate to severe facet arthropathy and ligamentum flavum redundancy. Epidural lipomatosis. Moderate osseous canal stenosis and  severe thecal sac effacement, 5 mm. RIGHT lateral recess stenosis may affect the traversing RIGHT L3 nerve. Mild RIGHT neural foraminal narrowing. L3-4: Small broad-based disc bulge. Moderate to severe facet arthropathy and ligamentum flavum redundancy. Trace facet effusions which are likely reactive. Moderate canal stenosis. Epidural lipomatosis. Severe effacement thecal sac narrowed to 4 mm. Mild bilateral neural foraminal narrowing. L4-5: 2 mm broad-based disc bulge. Moderate RIGHT and severe LEFT facet arthropathy and ligamentum flavum redundancy with trace facet effusions which are likely reactive. Epidural lipomatosis. Moderate to severe canal stenosis, severe effacement thecal sac to 5 mm. LEFT lateral recess stenosis likely affects the traversing LEFT L5 nerve. Mild RIGHT, moderate LEFT neural foraminal narrowing. L5-S1: 5 mm broad-based disc bulge asymmetric to the RIGHT, enhancing central annular fissure. Severe RIGHT and moderate LEFT facet arthropathy and ligamentum flavum redundancy. Epidural lipomatosis. Mild canal stenosis with partially effaced thecal sac. Moderate to severe RIGHT, mild LEFT neural foraminal narrowing. Encroachment upon the exited RIGHT L5 nerve. 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 Neural foraminal narrowing all cervical levels: Severe from C2-3 through C4-5. MRI THORACIC SPINE: Diffuse osseous metastasis without pathologic fracture. Tumoral expansion and epidural invasion at T6-7 results in mild canal stenosis and severe RIGHT T6-7 neural foraminal narrowing. Displaced thoracic spinal cord without cord compression. MRI LUMBAR SPINE: Diffuse osseous metastasis without pathologic fracture. Degenerative change and epidural lipomatosis result in severe thecal sac effacement L2-3 through L4-5. Moderate to severe canal stenosis L4-5,  moderate at L2-3 and L3-4. Neural foraminal narrowing L2-3 through L5-S1: Moderate to severe on the RIGHT at L5-S1. Acute findings discussed with and reconfirmed by Dr.Rancour on 12/19/2016 at 1:27 am. Electronically Signed   By: Elon Alas M.D.   On: 12/19/2016 01:28   Mr Thoracic Spine W Wo Contrast  Result Date: 12/19/2016 CLINICAL DATA:  Progressive back pain for 6 months, burning sensation to lower extremities for several months. History of metastatic prostate cancer. EXAM: MRI TOTAL SPINE WITHOUT AND WITH CONTRAST TECHNIQUE: Multisequence multiplanar MR imaging of the spine from the cervical spine to the sacrum was performed prior to and following IV contrast administration for evaluation of spinal metastatic disease. CONTRAST:  48mL MULTIHANCE GADOBENATE DIMEGLUMINE 529 MG/ML IV SOLN COMPARISON:  MRI of the lumbar spine November 09, 2016 FINDINGS: MRI CERVICAL SPINE FINDINGS ALIGNMENT: Straightened cervical lordosis.  No malalignment. VERTEBRAE/DISCS: Vertebral bodies are intact. Severe C3-4, moderate C4-5, moderate C5-6, moderate to severe C6-7, severe 7 C7-T1 disc height loss with moderate multilevel chronic discogenic endplate changes. No STIR signal abnormality to suggest acute fracture. Diffusely low bone marrow signal with multiple low T1, bright STIR enhancing metastasis throughout the cervical spine. Heterogeneous calvarium consistent with metastatic disease. CORD:Subcentimeter focus of T2 bright signal within the LEFT greater than RIGHT cervical spinal cord at C4 associated with cord compression. No syrinx, no abnormal cord, leptomeningeal or epidural enhancement. POSTERIOR FOSSA, VERTEBRAL ARTERIES, PARASPINAL TISSUES: No MR findings of ligamentous injury. Vertebral artery flow voids present. Included posterior fossa and paraspinal soft tissues are nonsuspicious. DISC LEVELS: C2-3: Small broad-based disc bulge asymmetric to the RIGHT. Uncovertebral hypertrophy. Severe  RIGHT, mild LEFT facet  arthropathy. No canal stenosis. Severe RIGHT, mild LEFT neural foraminal narrowing. C3-4: 2 mm broad-based disc bulge, uncovertebral hypertrophy mild facet arthropathy. Severe canal stenosis, AP dimension of the canal is 6 mm. Severe bilateral neural foraminal narrowing. C4-5: Small broad-based disc bulge, uncovertebral hypertrophy and mild facet arthropathy. No canal stenosis. Severe bilateral neural foraminal narrowing. C5-6: Small broad-based disc bulge, uncovertebral hypertrophy and mild facet arthropathy. No canal stenosis. Moderate RIGHT, and moderate to severe LEFT neural foraminal narrowing. C6-7: Small broad-based disc bulge, uncovertebral hypertrophy and mild facet arthropathy. No canal stenosis. Moderate bilateral neural foraminal narrowing. C7-T1: Small broad-based disc bulge, uncovertebral hypertrophy mild facet arthropathy. No canal stenosis. Moderate to severe bilateral neural foraminal narrowing. MRI THORACIC SPINE FINDINGS ALIGNMENT: Maintenance of the thoracic kyphosis. No malalignment. VERTEBRAE/DISCS: Diffusely low bone marrow signal with multiple low T1, bright STIR enhancing metastasis throughout the thoracic spine. Tumoral expansion of T4, T5, T6 and T7 middle column. Tumoral expansion RIGHT T7 pedicle and facet, LEFT T8 pedicle. Tumoral invasion of the RIGHT epidural space from T5-6 2 T7-8, extending into the neural foramen, RIGHT lateral posterior pleural space and paraspinal soft tissues, RIGHT T7 rib. No fracture deformity. Metastasis in all included ribs, including multifocal tumoral expansion. CORD: Multilevel cord compression, however are no cord edema. No abnormal cord, leptomeningeal enhancement. PREVERTEBRAL AND PARASPINAL SOFT TISSUES:  Nonacute. DISC LEVELS: At T6-7 and T7 RIGHT pedicle and epidural tumor effaces the thecal sac and mildly deforms the spinal cord. Mild canal stenosis. Tumor completely effaces the RIGHT C6-7 neural foramen. Tumor mildly narrows the RIGHT T7-8 neural  foramen. Lower lumbar small broad-based disc bulges and facet arthropathy. Mild-to-moderate T9-10 neural foraminal narrowing. MRI LUMBAR SPINE FINDINGS- moderately motion degraded examination. SEGMENTATION: For the purposes of this report, the last well-formed intervertebral disc will be described as L5-S1. ALIGNMENT: Maintenance of the lumbar lordosis. No malalignment. VERTEBRAE:Diffusely low bone marrow signal with multiple low T1, bright STIR enhancing metastasis throughout the lumbar spine. No pathologic fracture. Moderate to severe L5-S1 disc height loss. Multilevel mild desiccation. Moderate to severe chronic discogenic endplate changes 075-GRM, mild at the remaining lumbar levels. Expansile tumor RIGHT iliac bone invading the gluteal and paraspinal muscles. Congenital canal narrowing on the basis of foreshortened pedicles. Epidural lipomatosis. CONUS MEDULLARIS: Conus medullaris terminates at L1-2 and demonstrates normal morphology and signal characteristics. 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 No abnormal cord, leptomeningeal enhancement. No epidural tumor. PARASPINAL AND SOFT TISSUES: Paraspinal muscle bright interstitial STIR signal and enhancement most consistent with denervation. 2 cm gallstone. DISC LEVELS: L1-2: Annular bulging. Moderate facet arthropathy and ligamentum flavum redundancy without canal stenosis or neural foraminal narrowing. L2-3: 3 mm broad-based disc bulge. Moderate to severe facet arthropathy and ligamentum flavum redundancy. Epidural lipomatosis. Moderate osseous canal stenosis and severe thecal sac effacement, 5 mm. RIGHT lateral recess stenosis may affect the traversing RIGHT L3 nerve. Mild RIGHT neural foraminal narrowing. L3-4: Small broad-based disc bulge. Moderate to severe facet arthropathy and ligamentum flavum redundancy. Trace facet effusions which are likely reactive. Moderate canal stenosis. Epidural lipomatosis. Severe effacement thecal sac  narrowed to 4 mm. Mild bilateral neural foraminal narrowing. L4-5: 2 mm broad-based disc bulge. Moderate RIGHT and severe LEFT facet arthropathy and ligamentum flavum redundancy with trace facet effusions which are likely reactive. Epidural lipomatosis. Moderate to severe canal stenosis, severe effacement thecal sac to 5 mm. LEFT lateral recess stenosis likely affects the traversing LEFT L5 nerve. Mild RIGHT, moderate LEFT neural foraminal  narrowing. L5-S1: 5 mm broad-based disc bulge asymmetric to the RIGHT, enhancing central annular fissure. Severe RIGHT and moderate LEFT facet arthropathy and ligamentum flavum redundancy. Epidural lipomatosis. Mild canal stenosis with partially effaced thecal sac. Moderate to severe RIGHT, mild LEFT neural foraminal narrowing. Encroachment upon the exited RIGHT L5 nerve. 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 Neural foraminal narrowing all cervical levels: Severe from C2-3 through C4-5. MRI THORACIC SPINE: Diffuse osseous metastasis without pathologic fracture. Tumoral expansion and epidural invasion at T6-7 results in mild canal stenosis and severe RIGHT T6-7 neural foraminal narrowing. Displaced thoracic spinal cord without cord compression. MRI LUMBAR SPINE: Diffuse osseous metastasis without pathologic fracture. Degenerative change and epidural lipomatosis result in severe thecal sac effacement L2-3 through L4-5. Moderate to severe canal stenosis L4-5, moderate at L2-3 and L3-4. Neural foraminal narrowing L2-3 through L5-S1: Moderate to severe on the RIGHT at L5-S1. Acute findings discussed with and reconfirmed by Dr.Rancour on 12/19/2016 at 1:27 am. Electronically Signed   By: Elon Alas M.D.   On: 12/19/2016 01:28   Mr Lumbar Spine W Wo Contrast  Result Date: 12/19/2016 CLINICAL DATA:  Progressive back pain for 6  months, burning sensation to lower extremities for several months. History of metastatic prostate cancer. EXAM: MRI TOTAL SPINE WITHOUT AND WITH CONTRAST TECHNIQUE: Multisequence multiplanar MR imaging of the spine from the cervical spine to the sacrum was performed prior to and following IV contrast administration for evaluation of spinal metastatic disease. CONTRAST:  15mL MULTIHANCE GADOBENATE DIMEGLUMINE 529 MG/ML IV SOLN COMPARISON:  MRI of the lumbar spine November 09, 2016 FINDINGS: MRI CERVICAL SPINE FINDINGS ALIGNMENT: Straightened cervical lordosis.  No malalignment. VERTEBRAE/DISCS: Vertebral bodies are intact. Severe C3-4, moderate C4-5, moderate C5-6, moderate to severe C6-7, severe 7 C7-T1 disc height loss with moderate multilevel chronic discogenic endplate changes. No STIR signal abnormality to suggest acute fracture. Diffusely low bone marrow signal with multiple low T1, bright STIR enhancing metastasis throughout the cervical spine. Heterogeneous calvarium consistent with metastatic disease. CORD:Subcentimeter focus of T2 bright signal within the LEFT greater than RIGHT cervical spinal cord at C4 associated with cord compression. No syrinx, no abnormal cord, leptomeningeal or epidural enhancement. POSTERIOR FOSSA, VERTEBRAL ARTERIES, PARASPINAL TISSUES: No MR findings of ligamentous injury. Vertebral artery flow voids present. Included posterior fossa and paraspinal soft tissues are nonsuspicious. DISC LEVELS: C2-3: Small broad-based disc bulge asymmetric to the RIGHT. Uncovertebral hypertrophy. Severe RIGHT, mild LEFT facet arthropathy. No canal stenosis. Severe RIGHT, mild LEFT neural foraminal narrowing. C3-4: 2 mm broad-based disc bulge, uncovertebral hypertrophy mild facet arthropathy. Severe canal stenosis, AP dimension of the canal is 6 mm. Severe bilateral neural foraminal narrowing. C4-5: Small broad-based disc bulge, uncovertebral hypertrophy and mild facet arthropathy. No canal stenosis.  Severe bilateral neural foraminal narrowing. C5-6: Small broad-based disc bulge, uncovertebral hypertrophy and mild facet arthropathy. No canal stenosis. Moderate RIGHT, and moderate to severe LEFT neural foraminal narrowing. C6-7: Small broad-based disc bulge, uncovertebral hypertrophy and mild facet arthropathy. No canal stenosis. Moderate bilateral neural foraminal narrowing. C7-T1: Small broad-based disc bulge, uncovertebral hypertrophy mild facet arthropathy. No canal stenosis. Moderate to severe bilateral neural foraminal narrowing. MRI THORACIC SPINE FINDINGS ALIGNMENT: Maintenance of the thoracic kyphosis. No malalignment. VERTEBRAE/DISCS: Diffusely low bone marrow signal with multiple low T1, bright STIR enhancing metastasis throughout the thoracic spine. Tumoral expansion of T4, T5, T6 and T7 middle column. Tumoral expansion  RIGHT T7 pedicle and facet, LEFT T8 pedicle. Tumoral invasion of the RIGHT epidural space from T5-6 2 T7-8, extending into the neural foramen, RIGHT lateral posterior pleural space and paraspinal soft tissues, RIGHT T7 rib. No fracture deformity. Metastasis in all included ribs, including multifocal tumoral expansion. CORD: Multilevel cord compression, however are no cord edema. No abnormal cord, leptomeningeal enhancement. PREVERTEBRAL AND PARASPINAL SOFT TISSUES:  Nonacute. DISC LEVELS: At T6-7 and T7 RIGHT pedicle and epidural tumor effaces the thecal sac and mildly deforms the spinal cord. Mild canal stenosis. Tumor completely effaces the RIGHT C6-7 neural foramen. Tumor mildly narrows the RIGHT T7-8 neural foramen. Lower lumbar small broad-based disc bulges and facet arthropathy. Mild-to-moderate T9-10 neural foraminal narrowing. MRI LUMBAR SPINE FINDINGS- moderately motion degraded examination. SEGMENTATION: For the purposes of this report, the last well-formed intervertebral disc will be described as L5-S1. ALIGNMENT: Maintenance of the lumbar lordosis. No malalignment.  VERTEBRAE:Diffusely low bone marrow signal with multiple low T1, bright STIR enhancing metastasis throughout the lumbar spine. No pathologic fracture. Moderate to severe L5-S1 disc height loss. Multilevel mild desiccation. Moderate to severe chronic discogenic endplate changes [id number], mild at the remaining lumbar levels. Expansile tumor RIGHT iliac bone invading the gluteal and paraspinal muscles. Congenital canal narrowing on the basis of foreshortened pedicles. Epidural lipomatosis. CONUS MEDULLARIS: Conus medullaris terminates at L1-2 and demonstrates normal morphology and signal characteristics. 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 No abnormal cord, leptomeningeal enhancement. No epidural tumor. PARASPINAL AND SOFT TISSUES: Paraspinal muscle bright interstitial STIR signal and enhancement most consistent with denervation. 2 cm gallstone. DISC LEVELS: L1-2: Annular bulging. Moderate facet arthropathy and ligamentum flavum redundancy without canal stenosis or neural foraminal narrowing. L2-3: 3 mm broad-based disc bulge. Moderate to severe facet arthropathy and ligamentum flavum redundancy. Epidural lipomatosis. Moderate osseous canal stenosis and severe thecal sac effacement, 5 mm. RIGHT lateral recess stenosis may affect the traversing RIGHT L3 nerve. Mild RIGHT neural foraminal narrowing. L3-4: Small broad-based disc bulge. Moderate to severe facet arthropathy and ligamentum flavum redundancy. Trace facet effusions which are likely reactive. Moderate canal stenosis. Epidural lipomatosis. Severe effacement thecal sac narrowed to 4 mm. Mild bilateral neural foraminal narrowing. L4-5: 2 mm broad-based disc bulge. Moderate RIGHT and severe LEFT facet arthropathy and ligamentum flavum redundancy with trace facet effusions which are likely reactive. Epidural lipomatosis. Moderate to severe canal stenosis, severe effacement thecal sac to 5 mm. LEFT lateral recess stenosis likely affects the  traversing LEFT L5 nerve. Mild RIGHT, moderate LEFT neural foraminal narrowing. L5-S1: 5 mm broad-based disc bulge asymmetric to the RIGHT, enhancing central annular fissure. Severe RIGHT and moderate LEFT facet arthropathy and ligamentum flavum redundancy. Epidural lipomatosis. Mild canal stenosis with partially effaced thecal sac. Moderate to severe RIGHT, mild LEFT neural foraminal narrowing. Encroachment upon the exited RIGHT L5 nerve. 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 Neural foraminal narrowing all cervical levels: Severe from C2-3 through C4-5. MRI THORACIC SPINE: Diffuse osseous metastasis without pathologic fracture. Tumoral expansion and epidural invasion at T6-7 results in mild canal stenosis and severe RIGHT T6-7 neural foraminal narrowing. Displaced thoracic spinal cord without cord compression. MRI LUMBAR SPINE: Diffuse osseous metastasis without pathologic fracture. Degenerative change and epidural lipomatosis result in severe thecal sac effacement L2-3 through L4-5. Moderate to severe canal stenosis L4-5, moderate at L2-3 and L3-4. Neural foraminal narrowing L2-3 through L5-S1: Moderate to severe  on the RIGHT at L5-S1. Acute findings discussed with and reconfirmed by Dr.Rancour on 12/19/2016 at 1:27 am. Electronically Signed   By: Elon Alas M.D.   On: 12/19/2016 01:28        Scheduled Meds: . dexamethasone  4 mg Intravenous Q6H  . feeding supplement (ENSURE ENLIVE)  237 mL Oral BID BM  . polyethylene glycol  17 g Oral BID   Continuous Infusions:   LOS: 0 days    Time spent: 35 minutes    Harla Mensch A, MD [facility] Pager [phone]  If 7PM-7AM, please contact night-coverage [email or web address] Password [email or web address] 12/19/2016, 12:12 PM

## 2016-12-19 NOTE — ED Notes (Signed)
Delay in lab draw,  Pt not in room 

## 2016-12-19 NOTE — Progress Notes (Signed)
Initial Nutrition Assessment  DOCUMENTATION CODES:   Severe malnutrition in context of chronic illness  INTERVENTION:  Provide Ensure Enlive po TID, each supplement provides 350 kcal and 20 grams of protein.  Encourage adequate PO intake.   NUTRITION DIAGNOSIS:   Malnutrition (Severe) related to catabolic illness as evidenced by severe depletion of body fat, severe depletion of muscle mass.  GOAL:   Patient will meet greater than or equal to 90% of their needs  MONITOR:   PO intake, Supplement acceptance, Labs, Weight trends, Skin, I & O's  REASON FOR ASSESSMENT:   Malnutrition Screening Tool    ASSESSMENT:   66 y.o. male with a past medical history significant for metastatic prostate cancer, hepatitis C who presents with leg weakness from his Urologist's office, concern for cord compression.  Pt reports appetite is good. Meal completion has been 95-100%. Pt reports usually consuming at least 3 meals a day at home. Pt does reports he no longer cooks at home as he has not been able to move around, thus his brother brings him meals from restaurants. He reports his brother has been helpful and has been encouraging him to eat more to gain weight. Pt also reports consuming Boost Shakes at least 2 times daily. Usual body weight reported to be ~250 lbs which he reports last weighing 9 months ago. Noted no new weight recorded, thus tried weighing pt on bed scale but RD unsuccessful. Recommend obtaining new weight to fully assess weight trends. Pt currently has Ensure ordered and RD to modify orders to TID to aid in increased nutrient needs. Pt encouraged to eat his foods at meals and to drink his supplements.   Nutrition-Focused physical exam completed. Findings are severe fat depletion, severe muscle depletion, and no edema.   Labs and medications reviewed.   Diet Order:  Diet regular Room service appropriate? Yes; Fluid consistency: Thin  Skin:  Reviewed, no issues  Last BM:   1/29  Height:   Ht Readings from Last 1 Encounters:  12/18/16 6' 5.5" (1.969 m)    Weight:   Wt Readings from Last 1 Encounters:  11/14/16 200 lb (90.7 kg)    Ideal Body Weight:  95.9 kg  BMI:  There is no height or weight on file to calculate BMI.  Estimated Nutritional Needs:   Kcal:  [id number]  Protein:  115-130 grams  Fluid:  >/= 2.3 L/day  EDUCATION NEEDS:   Education needs addressed  Corrin Parker, MS, RD, LDN Pager # [phone] After hours/ weekend pager # [phone]

## 2016-12-19 NOTE — Consult Note (Signed)
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 Eduard Clos MATIN Ryan Ware is an 66 y.o. male.  HPI: patient is a 66 year old individual who has had a history of prostate cancer diagnosed in March 2017.  Apparently was seen by his oncologist back in December and referred to the emergency department because of paraparesis that his difficulty walking and weakness in his legs.  He was apparently sent away from the emergency room at [facility] but when he reappeared at his urologist office who sent to [facility].  Here he's had MRIs of the cervical thoracic and lumbar spines.  Patient notes that he has had difficulty affording any medication for his cancer treatments and he was seen at the [facility] and was to undergo some further treatment but now he has substantial back pain and weakness in his legs primarily when he feels weak in all his extremities.  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  Consultation was obtained to determine the best course of action.  In light of the widespread disease and systemic control needs to be a possible if treatment of this process is to be successful for any duration.  Most threatening lesion now is at T7-T8 level however I discussed with the patient Ryan Ware that surgery will give any long-term relief and restore his functional status is limited also concerned that part of his functional status is due to his emaciated condition he's had substantial muscle wasting.  At this time I feel we best  to treat this medically with high-dose steroids radiation if at all possible and look for systemic response as opposed to proceeding with any surgical intervention which is only likely to yield very short-term relief.  I discussed this with the patient today will continue to follow him for a period of time while here in the hospital.  Past Medical History:  Diagnosis Date  . Cancer ([place])    Prostrate Cancer with mets  . Hepatitis C     Past Surgical History:  Procedure Laterality Date  . HAND SURGERY      Family History  Problem Relation Age of Onset  . Hypertension Mother   . Osteoarthritis Father     Social History:  reports that he has been smoking Cigarettes.  He has been smoking about 0.50 packs per day. He has never used smokeless tobacco. He reports that he drinks alcohol. He reports that he does not use drugs.  Allergies:  Allergies  Allergen Reactions  . Acetaminophen Other (See Comments)    Liver problems    Medications: I have reviewed the patient's current medications.  Results for orders placed or performed during the hospital encounter of 12/18/16 (from the past 48 hour(s))  Basic metabolic panel     Status: Abnormal   Collection Time: 12/18/16  9:11 PM  Result Value Ref Range   Sodium 134 (L) 135 - 145 mmol/L   Potassium 3.6 3.5 - 5.1 mmol/L   Chloride 99 (L) 101 - 111 mmol/L   CO2 23 22 - 32 mmol/L  Glucose, Bld 100 (H) 65 - 99 mg/dL   BUN 7 6 - 20 mg/dL   Creatinine, Ser 0.45 (L) 0.61 - 1.24 mg/dL   Calcium 8.1 (L) 8.9 - 10.3 mg/dL   GFR calc non Af Amer >60 >60 mL/min   GFR calc Af Amer >60 >60 mL/min    Comment: (NOTE) The eGFR has been calculated using the CKD EPI equation. This calculation has not been validated in all clinical situations. eGFR's persistently <60 mL/min signify possible Chronic Kidney Disease.    Anion gap 12 5 - 15  CBC with Differential     Status: Abnormal   Collection Time: 12/18/16  9:11 PM  Result Value Ref Range    WBC 8.6 4.0 - 10.5 K/uL   RBC 2.60 (L) 4.22 - 5.81 MIL/uL   Hemoglobin 6.8 (LL) 13.0 - 17.0 g/dL    Comment: REPEATED TO VERIFY CRITICAL RESULT CALLED TO, READ BACK BY AND VERIFIED WITH: C.PRUITT,RN 2138 12/18/16 M.CAMPBELL    HCT 21.6 (L) 39.0 - 52.0 %   MCV 83.1 78.0 - 100.0 fL   MCH 26.2 26.0 - 34.0 pg   MCHC 31.5 30.0 - 36.0 g/dL   RDW 18.4 (H) 11.5 - 15.5 %   Platelets 103 (L) 150 - 400 K/uL    Comment: SPECIMEN CHECKED FOR CLOTS REPEATED TO VERIFY PLATELET COUNT CONFIRMED BY SMEAR    Neutrophils Relative % 56 %   Lymphocytes Relative 33 %   Monocytes Relative 9 %   Eosinophils Relative 1 %   Basophils Relative 1 %   Neutro Abs 4.8 1.7 - 7.7 K/uL   Lymphs Abs 2.8 0.7 - 4.0 K/uL   Monocytes Absolute 0.8 0.1 - 1.0 K/uL   Eosinophils Absolute 0.1 0.0 - 0.7 K/uL   Basophils Absolute 0.1 0.0 - 0.1 K/uL   RBC Morphology POLYCHROMASIA PRESENT     Comment: RARE NRBCs   WBC Morphology MILD LEFT SHIFT (1-5% METAS, OCC MYELO, OCC BANDS)   Protime-INR     Status: Abnormal   Collection Time: 12/19/16 12:50 AM  Result Value Ref Range   Prothrombin Time 17.2 (H) 11.4 - 15.2 seconds   INR 1.40   Vitamin B12     Status: None   Collection Time: 12/19/16 12:50 AM  Result Value Ref Range   Vitamin B-12 499 180 - 914 pg/mL    Comment: (NOTE) This assay is not validated for testing neonatal or myeloproliferative syndrome specimens for Vitamin B12 levels.   Folate     Status: None   Collection Time: 12/19/16 12:50 AM  Result Value Ref Range   Folate 10.4 >5.9 ng/mL  Iron and TIBC     Status: Abnormal   Collection Time: 12/19/16 12:50 AM  Result Value Ref Range   Iron 52 45 - 182 ug/dL   TIBC 176 (L) 250 - 450 ug/dL   Saturation Ratios 29 17.9 - 39.5 %   UIBC 124 ug/dL  Ferritin     Status: Abnormal   Collection Time: 12/19/16 12:50 AM  Result Value Ref Range   Ferritin 3,578 (H) 24 - 336 ng/mL  Reticulocytes     Status: Abnormal   Collection Time: 12/19/16 12:50 AM  Result  Value Ref Range   Retic Ct Pct 3.9 (H) 0.4 - 3.1 %   RBC. 2.76 (L) 4.22 - 5.81 MIL/uL   Retic Count, Manual 107.6 19.0 - 186.0 K/uL  Type and screen [place]     Status: None (  Preliminary result)   Collection Time: 12/19/16 12:55 AM  Result Value Ref Range   ABO/RH(D) B POS    Antibody Screen NEG    Sample Expiration 12/22/2016    Unit Number [id number]    Blood Component Type RED CELLS,LR    Unit division 00    Status of Unit ISSUED    Transfusion Status OK TO TRANSFUSE    Crossmatch Result Compatible   ABO/Rh     Status: None   Collection Time: 12/19/16 12:55 AM  Result Value Ref Range   ABO/RH(D) B POS   Direct antiglobulin test (not at [facility])     Status: None   Collection Time: 12/19/16 12:55 AM  Result Value Ref Range   DAT, complement NEG    DAT, IgG NEG   POC occult blood, ED RN will collect     Status: None   Collection Time: 12/19/16  2:08 AM  Result Value Ref Range   Fecal Occult Bld NEGATIVE NEGATIVE  Prepare RBC     Status: None   Collection Time: 12/19/16  4:27 AM  Result Value Ref Range   Order Confirmation ORDER PROCESSED BY BLOOD BANK     Mr Cervical Spine W Wo Contrast  Result Date: 12/19/2016 CLINICAL DATA:  Progressive back pain for 6 months, burning sensation to lower extremities for several months. History of metastatic prostate cancer. EXAM: MRI TOTAL SPINE WITHOUT AND WITH CONTRAST TECHNIQUE: Multisequence multiplanar MR imaging of the spine from the cervical spine to the sacrum was performed prior to and following IV contrast administration for evaluation of spinal metastatic disease. CONTRAST:  76m MULTIHANCE GADOBENATE DIMEGLUMINE 529 MG/ML IV SOLN COMPARISON:  MRI of the lumbar spine November 09, 2016 FINDINGS: MRI CERVICAL SPINE FINDINGS ALIGNMENT: Straightened cervical lordosis.  No malalignment. VERTEBRAE/DISCS: Vertebral bodies are intact. Severe C3-4, moderate C4-5, moderate C5-6, moderate to severe C6-7, severe 7 C7-T1 disc  height loss with moderate multilevel chronic discogenic endplate changes. No STIR signal abnormality to suggest acute fracture. Diffusely low bone marrow signal with multiple low T1, bright STIR enhancing metastasis throughout the cervical spine. Heterogeneous calvarium consistent with metastatic disease. CORD:Subcentimeter focus of T2 bright signal within the LEFT greater than RIGHT cervical spinal cord at C4 associated with cord compression. No syrinx, no abnormal cord, leptomeningeal or epidural enhancement. POSTERIOR FOSSA, VERTEBRAL ARTERIES, PARASPINAL TISSUES: No MR findings of ligamentous injury. Vertebral artery flow voids present. Included posterior fossa and paraspinal soft tissues are nonsuspicious. DISC LEVELS: C2-3: Small broad-based disc bulge asymmetric to the RIGHT. Uncovertebral hypertrophy. Severe RIGHT, mild LEFT facet arthropathy. No canal stenosis. Severe RIGHT, mild LEFT neural foraminal narrowing. C3-4: 2 mm broad-based disc bulge, uncovertebral hypertrophy mild facet arthropathy. Severe canal stenosis, AP dimension of the canal is 6 mm. Severe bilateral neural foraminal narrowing. C4-5: Small broad-based disc bulge, uncovertebral hypertrophy and mild facet arthropathy. No canal stenosis. Severe bilateral neural foraminal narrowing. C5-6: Small broad-based disc bulge, uncovertebral hypertrophy and mild facet arthropathy. No canal stenosis. Moderate RIGHT, and moderate to severe LEFT neural foraminal narrowing. C6-7: Small broad-based disc bulge, uncovertebral hypertrophy and mild facet arthropathy. No canal stenosis. Moderate bilateral neural foraminal narrowing. C7-T1: Small broad-based disc bulge, uncovertebral hypertrophy mild facet arthropathy. No canal stenosis. Moderate to severe bilateral neural foraminal narrowing. MRI THORACIC SPINE FINDINGS ALIGNMENT: Maintenance of the thoracic kyphosis. No malalignment. VERTEBRAE/DISCS: Diffusely low bone marrow signal with multiple low T1, bright  STIR enhancing metastasis throughout the thoracic spine. Tumoral expansion of T4, T5, T6 and T7  middle column. Tumoral expansion RIGHT T7 pedicle and facet, LEFT T8 pedicle. Tumoral invasion of the RIGHT epidural space from T5-6 2 T7-8, extending into the neural foramen, RIGHT lateral posterior pleural space and paraspinal soft tissues, RIGHT T7 rib. No fracture deformity. Metastasis in all included ribs, including multifocal tumoral expansion. CORD: Multilevel cord compression, however are no cord edema. No abnormal cord, leptomeningeal enhancement. PREVERTEBRAL AND PARASPINAL SOFT TISSUES:  Nonacute. DISC LEVELS: At T6-7 and T7 RIGHT pedicle and epidural tumor effaces the thecal sac and mildly deforms the spinal cord. Mild canal stenosis. Tumor completely effaces the RIGHT C6-7 neural foramen. Tumor mildly narrows the RIGHT T7-8 neural foramen. Lower lumbar small broad-based disc bulges and facet arthropathy. Mild-to-moderate T9-10 neural foraminal narrowing. MRI LUMBAR SPINE FINDINGS- moderately motion degraded examination. SEGMENTATION: For the purposes of this report, the last well-formed intervertebral disc will be described as L5-S1. ALIGNMENT: Maintenance of the lumbar lordosis. No malalignment. VERTEBRAE:Diffusely low bone marrow signal with multiple low T1, bright STIR enhancing metastasis throughout the lumbar spine. No pathologic fracture. Moderate to severe L5-S1 disc height loss. Multilevel mild desiccation. Moderate to severe chronic discogenic endplate changes F7-C9, mild at the remaining lumbar levels. Expansile tumor RIGHT iliac bone invading the gluteal and paraspinal muscles. Congenital canal narrowing on the basis of foreshortened pedicles. Epidural lipomatosis. CONUS MEDULLARIS: Conus medullaris terminates at L1-2 and demonstrates normal morphology and signal characteristics. 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 No abnormal cord, leptomeningeal enhancement. No  epidural tumor. PARASPINAL AND SOFT TISSUES: Paraspinal muscle bright interstitial STIR signal and enhancement most consistent with denervation. 2 cm gallstone. DISC LEVELS: L1-2: Annular bulging. Moderate facet arthropathy and ligamentum flavum redundancy without canal stenosis or neural foraminal narrowing. L2-3: 3 mm broad-based disc bulge. Moderate to severe facet arthropathy and ligamentum flavum redundancy. Epidural lipomatosis. Moderate osseous canal stenosis and severe thecal sac effacement, 5 mm. RIGHT lateral recess stenosis may affect the traversing RIGHT L3 nerve. Mild RIGHT neural foraminal narrowing. L3-4: Small broad-based disc bulge. Moderate to severe facet arthropathy and ligamentum flavum redundancy. Trace facet effusions which are likely reactive. Moderate canal stenosis. Epidural lipomatosis. Severe effacement thecal sac narrowed to 4 mm. Mild bilateral neural foraminal narrowing. L4-5: 2 mm broad-based disc bulge. Moderate RIGHT and severe LEFT facet arthropathy and ligamentum flavum redundancy with trace facet effusions which are likely reactive. Epidural lipomatosis. Moderate to severe canal stenosis, severe effacement thecal sac to 5 mm. LEFT lateral recess stenosis likely affects the traversing LEFT L5 nerve. Mild RIGHT, moderate LEFT neural foraminal narrowing. L5-S1: 5 mm broad-based disc bulge asymmetric to the RIGHT, enhancing central annular fissure. Severe RIGHT and moderate LEFT facet arthropathy and ligamentum flavum redundancy. Epidural lipomatosis. Mild canal stenosis with partially effaced thecal sac. Moderate to severe RIGHT, mild LEFT neural foraminal narrowing. Encroachment upon the exited RIGHT L5 nerve. 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 Neural foraminal narrowing all cervical levels: Severe from C2-3 through C4-5. MRI  THORACIC SPINE: Diffuse osseous metastasis without pathologic fracture. Tumoral expansion and epidural invasion at T6-7 results in mild canal stenosis and severe RIGHT T6-7 neural foraminal narrowing. Displaced thoracic spinal cord without cord compression. MRI LUMBAR SPINE: Diffuse osseous metastasis without pathologic fracture. Degenerative change and epidural lipomatosis result in severe thecal sac effacement L2-3 through L4-5. Moderate to severe canal stenosis L4-5, moderate at L2-3 and L3-4. Neural foraminal narrowing L2-3 through  L5-S1: Moderate to severe on the RIGHT at L5-S1. Acute findings discussed with and reconfirmed by Dr.Rancour on 12/19/2016 at 1:27 am. Electronically Signed   By: Elon Alas M.D.   On: 12/19/2016 01:28   Mr Thoracic Spine W Wo Contrast  Result Date: 12/19/2016 CLINICAL DATA:  Progressive back pain for 6 months, burning sensation to lower extremities for several months. History of metastatic prostate cancer. EXAM: MRI TOTAL SPINE WITHOUT AND WITH CONTRAST TECHNIQUE: Multisequence multiplanar MR imaging of the spine from the cervical spine to the sacrum was performed prior to and following IV contrast administration for evaluation of spinal metastatic disease. CONTRAST:  53m MULTIHANCE GADOBENATE DIMEGLUMINE 529 MG/ML IV SOLN COMPARISON:  MRI of the lumbar spine November 09, 2016 FINDINGS: MRI CERVICAL SPINE FINDINGS ALIGNMENT: Straightened cervical lordosis.  No malalignment. VERTEBRAE/DISCS: Vertebral bodies are intact. Severe C3-4, moderate C4-5, moderate C5-6, moderate to severe C6-7, severe 7 C7-T1 disc height loss with moderate multilevel chronic discogenic endplate changes. No STIR signal abnormality to suggest acute fracture. Diffusely low bone marrow signal with multiple low T1, bright STIR enhancing metastasis throughout the cervical spine. Heterogeneous calvarium consistent with metastatic disease. CORD:Subcentimeter focus of T2 bright signal within the LEFT greater  than RIGHT cervical spinal cord at C4 associated with cord compression. No syrinx, no abnormal cord, leptomeningeal or epidural enhancement. POSTERIOR FOSSA, VERTEBRAL ARTERIES, PARASPINAL TISSUES: No MR findings of ligamentous injury. Vertebral artery flow voids present. Included posterior fossa and paraspinal soft tissues are nonsuspicious. DISC LEVELS: C2-3: Small broad-based disc bulge asymmetric to the RIGHT. Uncovertebral hypertrophy. Severe RIGHT, mild LEFT facet arthropathy. No canal stenosis. Severe RIGHT, mild LEFT neural foraminal narrowing. C3-4: 2 mm broad-based disc bulge, uncovertebral hypertrophy mild facet arthropathy. Severe canal stenosis, AP dimension of the canal is 6 mm. Severe bilateral neural foraminal narrowing. C4-5: Small broad-based disc bulge, uncovertebral hypertrophy and mild facet arthropathy. No canal stenosis. Severe bilateral neural foraminal narrowing. C5-6: Small broad-based disc bulge, uncovertebral hypertrophy and mild facet arthropathy. No canal stenosis. Moderate RIGHT, and moderate to severe LEFT neural foraminal narrowing. C6-7: Small broad-based disc bulge, uncovertebral hypertrophy and mild facet arthropathy. No canal stenosis. Moderate bilateral neural foraminal narrowing. C7-T1: Small broad-based disc bulge, uncovertebral hypertrophy mild facet arthropathy. No canal stenosis. Moderate to severe bilateral neural foraminal narrowing. MRI THORACIC SPINE FINDINGS ALIGNMENT: Maintenance of the thoracic kyphosis. No malalignment. VERTEBRAE/DISCS: Diffusely low bone marrow signal with multiple low T1, bright STIR enhancing metastasis throughout the thoracic spine. Tumoral expansion of T4, T5, T6 and T7 middle column. Tumoral expansion RIGHT T7 pedicle and facet, LEFT T8 pedicle. Tumoral invasion of the RIGHT epidural space from T5-6 2 T7-8, extending into the neural foramen, RIGHT lateral posterior pleural space and paraspinal soft tissues, RIGHT T7 rib. No fracture deformity.  Metastasis in all included ribs, including multifocal tumoral expansion. CORD: Multilevel cord compression, however are no cord edema. No abnormal cord, leptomeningeal enhancement. PREVERTEBRAL AND PARASPINAL SOFT TISSUES:  Nonacute. DISC LEVELS: At T6-7 and T7 RIGHT pedicle and epidural tumor effaces the thecal sac and mildly deforms the spinal cord. Mild canal stenosis. Tumor completely effaces the RIGHT C6-7 neural foramen. Tumor mildly narrows the RIGHT T7-8 neural foramen. Lower lumbar small broad-based disc bulges and facet arthropathy. Mild-to-moderate T9-10 neural foraminal narrowing. MRI LUMBAR SPINE FINDINGS- moderately motion degraded examination. SEGMENTATION: For the purposes of this report, the last well-formed intervertebral disc will be described as L5-S1. ALIGNMENT: Maintenance of the lumbar lordosis. No malalignment. VERTEBRAE:Diffusely low bone marrow signal with multiple  low T1, bright STIR enhancing metastasis throughout the lumbar spine. No pathologic fracture. Moderate to severe L5-S1 disc height loss. Multilevel mild desiccation. Moderate to severe chronic discogenic endplate changes W9-U0, mild at the remaining lumbar levels. Expansile tumor RIGHT iliac bone invading the gluteal and paraspinal muscles. Congenital canal narrowing on the basis of foreshortened pedicles. Epidural lipomatosis. CONUS MEDULLARIS: Conus medullaris terminates at L1-2 and demonstrates normal morphology and signal characteristics. 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 No abnormal cord, leptomeningeal enhancement. No epidural tumor. PARASPINAL AND SOFT TISSUES: Paraspinal muscle bright interstitial STIR signal and enhancement most consistent with denervation. 2 cm gallstone. DISC LEVELS: L1-2: Annular bulging. Moderate facet arthropathy and ligamentum flavum redundancy without canal stenosis or neural foraminal narrowing. L2-3: 3 mm broad-based disc bulge. Moderate to severe facet  arthropathy and ligamentum flavum redundancy. Epidural lipomatosis. Moderate osseous canal stenosis and severe thecal sac effacement, 5 mm. RIGHT lateral recess stenosis may affect the traversing RIGHT L3 nerve. Mild RIGHT neural foraminal narrowing. L3-4: Small broad-based disc bulge. Moderate to severe facet arthropathy and ligamentum flavum redundancy. Trace facet effusions which are likely reactive. Moderate canal stenosis. Epidural lipomatosis. Severe effacement thecal sac narrowed to 4 mm. Mild bilateral neural foraminal narrowing. L4-5: 2 mm broad-based disc bulge. Moderate RIGHT and severe LEFT facet arthropathy and ligamentum flavum redundancy with trace facet effusions which are likely reactive. Epidural lipomatosis. Moderate to severe canal stenosis, severe effacement thecal sac to 5 mm. LEFT lateral recess stenosis likely affects the traversing LEFT L5 nerve. Mild RIGHT, moderate LEFT neural foraminal narrowing. L5-S1: 5 mm broad-based disc bulge asymmetric to the RIGHT, enhancing central annular fissure. Severe RIGHT and moderate LEFT facet arthropathy and ligamentum flavum redundancy. Epidural lipomatosis. Mild canal stenosis with partially effaced thecal sac. Moderate to severe RIGHT, mild LEFT neural foraminal narrowing. Encroachment upon the exited RIGHT L5 nerve. 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 Neural foraminal narrowing all cervical levels: Severe from C2-3 through C4-5. MRI THORACIC SPINE: Diffuse osseous metastasis without pathologic fracture. Tumoral expansion and epidural invasion at T6-7 results in mild canal stenosis and severe RIGHT T6-7 neural foraminal narrowing. Displaced thoracic spinal cord without cord compression. MRI LUMBAR SPINE: Diffuse osseous metastasis without pathologic fracture. Degenerative change and epidural  lipomatosis result in severe thecal sac effacement L2-3 through L4-5. Moderate to severe canal stenosis L4-5, moderate at L2-3 and L3-4. Neural foraminal narrowing L2-3 through L5-S1: Moderate to severe on the RIGHT at L5-S1. Acute findings discussed with and reconfirmed by Dr.Rancour on 12/19/2016 at 1:27 am. Electronically Signed   By: Elon Alas M.D.   On: 12/19/2016 01:28   Mr Lumbar Spine W Wo Contrast  Result Date: 12/19/2016 CLINICAL DATA:  Progressive back pain for 6 months, burning sensation to lower extremities for several months. History of metastatic prostate cancer. EXAM: MRI TOTAL SPINE WITHOUT AND WITH CONTRAST TECHNIQUE: Multisequence multiplanar MR imaging of the spine from the cervical spine to the sacrum was performed prior to and following IV contrast administration for evaluation of spinal metastatic disease. CONTRAST:  2m MULTIHANCE GADOBENATE DIMEGLUMINE 529 MG/ML IV SOLN COMPARISON:  MRI of the lumbar spine November 09, 2016 FINDINGS: MRI CERVICAL SPINE FINDINGS ALIGNMENT: Straightened cervical lordosis.  No malalignment. VERTEBRAE/DISCS: Vertebral bodies are intact. Severe C3-4, moderate C4-5, moderate C5-6, moderate to severe C6-7, severe 7 C7-T1 disc height loss with moderate multilevel chronic discogenic endplate  changes. No STIR signal abnormality to suggest acute fracture. Diffusely low bone marrow signal with multiple low T1, bright STIR enhancing metastasis throughout the cervical spine. Heterogeneous calvarium consistent with metastatic disease. CORD:Subcentimeter focus of T2 bright signal within the LEFT greater than RIGHT cervical spinal cord at C4 associated with cord compression. No syrinx, no abnormal cord, leptomeningeal or epidural enhancement. POSTERIOR FOSSA, VERTEBRAL ARTERIES, PARASPINAL TISSUES: No MR findings of ligamentous injury. Vertebral artery flow voids present. Included posterior fossa and paraspinal soft tissues are nonsuspicious. DISC LEVELS: C2-3:  Small broad-based disc bulge asymmetric to the RIGHT. Uncovertebral hypertrophy. Severe RIGHT, mild LEFT facet arthropathy. No canal stenosis. Severe RIGHT, mild LEFT neural foraminal narrowing. C3-4: 2 mm broad-based disc bulge, uncovertebral hypertrophy mild facet arthropathy. Severe canal stenosis, AP dimension of the canal is 6 mm. Severe bilateral neural foraminal narrowing. C4-5: Small broad-based disc bulge, uncovertebral hypertrophy and mild facet arthropathy. No canal stenosis. Severe bilateral neural foraminal narrowing. C5-6: Small broad-based disc bulge, uncovertebral hypertrophy and mild facet arthropathy. No canal stenosis. Moderate RIGHT, and moderate to severe LEFT neural foraminal narrowing. C6-7: Small broad-based disc bulge, uncovertebral hypertrophy and mild facet arthropathy. No canal stenosis. Moderate bilateral neural foraminal narrowing. C7-T1: Small broad-based disc bulge, uncovertebral hypertrophy mild facet arthropathy. No canal stenosis. Moderate to severe bilateral neural foraminal narrowing. MRI THORACIC SPINE FINDINGS ALIGNMENT: Maintenance of the thoracic kyphosis. No malalignment. VERTEBRAE/DISCS: Diffusely low bone marrow signal with multiple low T1, bright STIR enhancing metastasis throughout the thoracic spine. Tumoral expansion of T4, T5, T6 and T7 middle column. Tumoral expansion RIGHT T7 pedicle and facet, LEFT T8 pedicle. Tumoral invasion of the RIGHT epidural space from T5-6 2 T7-8, extending into the neural foramen, RIGHT lateral posterior pleural space and paraspinal soft tissues, RIGHT T7 rib. No fracture deformity. Metastasis in all included ribs, including multifocal tumoral expansion. CORD: Multilevel cord compression, however are no cord edema. No abnormal cord, leptomeningeal enhancement. PREVERTEBRAL AND PARASPINAL SOFT TISSUES:  Nonacute. DISC LEVELS: At T6-7 and T7 RIGHT pedicle and epidural tumor effaces the thecal sac and mildly deforms the spinal cord. Mild  canal stenosis. Tumor completely effaces the RIGHT C6-7 neural foramen. Tumor mildly narrows the RIGHT T7-8 neural foramen. Lower lumbar small broad-based disc bulges and facet arthropathy. Mild-to-moderate T9-10 neural foraminal narrowing. MRI LUMBAR SPINE FINDINGS- moderately motion degraded examination. SEGMENTATION: For the purposes of this report, the last well-formed intervertebral disc will be described as L5-S1. ALIGNMENT: Maintenance of the lumbar lordosis. No malalignment. VERTEBRAE:Diffusely low bone marrow signal with multiple low T1, bright STIR enhancing metastasis throughout the lumbar spine. No pathologic fracture. Moderate to severe L5-S1 disc height loss. Multilevel mild desiccation. Moderate to severe chronic discogenic endplate changes E5-U3, mild at the remaining lumbar levels. Expansile tumor RIGHT iliac bone invading the gluteal and paraspinal muscles. Congenital canal narrowing on the basis of foreshortened pedicles. Epidural lipomatosis. CONUS MEDULLARIS: Conus medullaris terminates at L1-2 and demonstrates normal morphology and signal characteristics. 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 No abnormal cord, leptomeningeal enhancement. No epidural tumor. PARASPINAL AND SOFT TISSUES: Paraspinal muscle bright interstitial STIR signal and enhancement most consistent with denervation. 2 cm gallstone. DISC LEVELS: L1-2: Annular bulging. Moderate facet arthropathy and ligamentum flavum redundancy without canal stenosis or neural foraminal narrowing. L2-3: 3 mm broad-based disc bulge. Moderate to severe facet arthropathy and ligamentum flavum redundancy. Epidural lipomatosis. Moderate osseous canal stenosis and severe thecal sac effacement, 5 mm. RIGHT lateral recess stenosis may affect the traversing RIGHT L3  nerve. Mild RIGHT neural foraminal narrowing. L3-4: Small broad-based disc bulge. Moderate to severe facet arthropathy and ligamentum flavum redundancy. Trace facet  effusions which are likely reactive. Moderate canal stenosis. Epidural lipomatosis. Severe effacement thecal sac narrowed to 4 mm. Mild bilateral neural foraminal narrowing. L4-5: 2 mm broad-based disc bulge. Moderate RIGHT and severe LEFT facet arthropathy and ligamentum flavum redundancy with trace facet effusions which are likely reactive. Epidural lipomatosis. Moderate to severe canal stenosis, severe effacement thecal sac to 5 mm. LEFT lateral recess stenosis likely affects the traversing LEFT L5 nerve. Mild RIGHT, moderate LEFT neural foraminal narrowing. L5-S1: 5 mm broad-based disc bulge asymmetric to the RIGHT, enhancing central annular fissure. Severe RIGHT and moderate LEFT facet arthropathy and ligamentum flavum redundancy. Epidural lipomatosis. Mild canal stenosis with partially effaced thecal sac. Moderate to severe RIGHT, mild LEFT neural foraminal narrowing. Encroachment upon the exited RIGHT L5 nerve. 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 Neural foraminal narrowing all cervical levels: Severe from C2-3 through C4-5. MRI THORACIC SPINE: Diffuse osseous metastasis without pathologic fracture. Tumoral expansion and epidural invasion at T6-7 results in mild canal stenosis and severe RIGHT T6-7 neural foraminal narrowing. Displaced thoracic spinal cord without cord compression. MRI LUMBAR SPINE: Diffuse osseous metastasis without pathologic fracture. Degenerative change and epidural lipomatosis result in severe thecal sac effacement L2-3 through L4-5. Moderate to severe canal stenosis L4-5, moderate at L2-3 and L3-4. Neural foraminal narrowing L2-3 through L5-S1: Moderate to severe on the RIGHT at L5-S1. Acute findings discussed with and reconfirmed by Dr.Rancour on 12/19/2016 at 1:27 am. Electronically Signed   By: Elon Alas M.D.   On: 12/19/2016  01:28    Review of Systems  Constitutional: Positive for malaise/fatigue and weight loss.  Musculoskeletal: Positive for back pain and neck pain.  Neurological: Positive for sensory change, focal weakness and weakness.   Blood pressure 118/60, pulse 100, temperature 99 F (37.2 C), temperature source Oral, resp. rate 14, height 6' 5.5" (1.969 m), SpO2 97 %. Physical Exam  Constitutional: He is oriented to person, place, and time. He appears well-developed.  Cachectic-appearing  HENT:  Head: Normocephalic.  Eyes: Conjunctivae and EOM are normal. Pupils are equal, round, and reactive to light.  Neck: Normal range of motion. Neck supple.  Musculoskeletal:  Back pain centered in the mid thoracic spine tender to palpation and percussion.  Neurological: He is alert and oriented to person, place, and time.  Absent deep tendon reflexes in upper and lower extremities.  Upper extremity strength graded at 4 minus out of 5 and grips deltoids biceps triceps and intrinsics.  Lower extremity strength graded better with good antigravity strength in the iliopsoas and quadriceps however be graded at 4 minus dorsi and plantarflex her strength appears to be intact however note is made of significant muscle wasting in all limbs upper and lower.  His cranial nerve examination is within the limits of normal  Skin: Skin is warm and dry.  Psychiatric: He has a normal mood and affect. His behavior is normal. Judgment and thought content normal.    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  Grossly emaciated patient with substantial muscle wasting and weakness.  I have advised the patient regarding the above and please refer to the above discussion.  Systemic treatment for his cancer needs to show some success before any  surgical intervention is considered.  ELSNER,HENRY J 12/19/2016, 8:36 AM

## 2016-12-19 NOTE — Progress Notes (Signed)
Patient arrived to bed 25 via stretcher. He was alert and oriented with stable vitals on room air. Did not appear to be in any acute distress. He was oriented to room, orders and plan of care reviewed. Will continue to assess pt.

## 2016-12-19 NOTE — H&P (Signed)
History and Physical  Patient Name: Ryan Ware     [id number]    DOB: June 15, 1951    DOA: 12/18/2016 PCP: No PCP Per Patient  Urology: [facility]. [facility]enc[facility]    Patient coming from: [facility]  Chief Complaint: Back pain, leg numbness, bowel and bladder dysfunction, leg weakness  HPI: Ryan Ware is a 66 y.o. male with a past medical history significant for metastatic prostate cancer, hepatitis C who presents with leg weakness from his Urologist's office, concern for cord compression.  The patient has metastatic prostate cancer, planning to start palliative chemotherapy with [facility] in [place] soon.  Evidently the patient went to his oncology office at the [facility] a week or 2 ago for follow-up of his prostate cancer, was noted to have tachycardia and inability to move legs, was sent to the emergency room for evaluation for cord compression. He went to [facility] ED where they thought he was able to walk, performed no evaluation for cord compression and just gave him an antibiotic for UTI and discharged him. He did not get the antibiotic.  Today he went back to his oncologist, he had saddle paresthesias that had progressed, continued severe pain in the back, numbness in his buttocks and legs, and weakness with walking. He was referred again to the emergency room, this time at [facility], for evaluation of cord compression.  ED course: -Temp 99.61F, heart rate 98, respirations and pulse ox normal, blood pressure 115/67 -Na 134, K 3.6, Cr 0.45, WBC 8.6K, Hgb 6.8 -INR 1.4 -Ferritin up, folate and B12 normal -He denied rectal bleeding, melena, hematemesis, epistaxis, hemoptysis; has chronic mild hematuria -MRI was obtained that showed focal cord edema near C4 actually because of degenerative changes, tumor-related T6-7 canal stenosis and neuroforaminal narrowing, then severe thecal sac effacement again due to degenerative change and epidural  lipomatosisL2-5. -The case was discussed with Dr. Ellene Route of Neurosurgery, who agreed to review the images and evaluate the patient in the morning, after Decadron and TRH were asked to evaluate     Prostate cancer history: -01/16/16: Diagnosed with prostate cancer after episode of hematuria -March 2017: Started with Nicolette Bang at [facility] -05/21/16: Metastatic prostate cancer with numerous spine metastases -06/25/16: Started Lupron three-month injection, PSA 1200 -10/01/16: Weight loss and new pains, new leg weakness developed this November. Lupron given again. Was referred to neurosurgery but did not it evaluated. Was supposed to start Zytiga and Gillermina Phy, but left [facility] because of cost, transferred care to [place].  Never had Xgeva because did not see dentist -11/09/16: MRI of lumbar spine shows mets, patient didn't tolerate imaging so axial imaging not done -11/14/16: CTA of chest showed new lung lesions consistent with metastases  Her most recent oncology notes from the [place], they are planning further Leupron, as well as Xtandi.         ROS: Review of Systems  Gastrointestinal: Positive for constipation.  Musculoskeletal: Positive for back pain.  Neurological: Positive for sensory change and focal weakness.  All other systems reviewed and are negative.         Past Medical History:  Diagnosis Date  . Cancer ([place])    Prostrate Cancer with mets  . Hepatitis C     Past Surgical History:  Procedure Laterality Date  . HAND SURGERY      Social History: Patient lives alone.  The patient walks unassisted.  He is retired from working as an Chief Financial Officer for [employer].  He is a smoker.  He drinks rarely.    Allergies  Allergen Reactions  . Acetaminophen Other (See Comments)    Liver problems    Family history: family history includes Hypertension in his mother; Osteoarthritis in his father.  Prior to Admission medications   Medication Sig Start Date  End Date Taking? Authorizing Provider  oxyCODONE-acetaminophen (PERCOCET) 5-325 MG tablet Take 1-2 tablets by mouth every 4 (four) hours as needed. Patient taking differently: Take 2 tablets by mouth every 4 (four) hours as needed.  11/09/16  Yes Nat Christen, MD  polyethylene glycol [facility] / GLYCOLAX) packet Take 17 g by mouth daily as needed.   Yes Historical Provider, MD       Physical Exam: BP 122/79   Pulse 95   Temp 99.2 F (37.3 C) (Oral)   Resp 17   Ht 6' 5.5" (1.969 m)   SpO2 98%  General appearance: Thin adult male, alert and in no acute distress.   Eyes: Anicteric, conjunctiva pink, lids and lashes normal. PERRL.    ENT: No nasal deformity, discharge, epistaxis.  Hearing normal. OP moist without lesions.  Poor dentition. Neck: No neck masses.  Trachea midline.  No thyromegaly/tenderness. Lymph: No cervical or supraclavicular lymphadenopathy. Skin: Warm and dry.  No jaundice.  No suspicious rashes or lesions. Cardiac: RRR, nl S1-S2, no murmurs appreciated.  Capillary refill is brisk.  JVP normal.  No LE edema.  Radial and DP pulses 2+ and symmetric. Respiratory: Normal respiratory rate and rhythm.  CTAB without rales or wheezes. Abdomen: Abdomen soft.  No TTP. No ascites, distension, hepatosplenomegaly.   MSK: No deformities or effusions.  No cyanosis or clubbing.  No tenderness to vertebral palpation.   Neuro: Cranial nerves normal.  Sensation intact to light touch on face, arms, trunk, not legs. Speech is fluent.   Decreased patellar reflexes.  Weakness in bilateral lower extremities,normal strength in bilateral upper extremities. Psych: Sensorium intact and responding to questions, attention normal.  Behavior appropriate.  Affect normal.  Judgment and insight appear normal.     Labs on Admission:  I have personally reviewed following labs and imaging studies: CBC:  Recent Labs Lab 12/18/16 2111  WBC 8.6  NEUTROABS 4.8  HGB 6.8*  HCT 21.6*  MCV 83.1  PLT 103*    Basic Metabolic Panel:  Recent Labs Lab 12/18/16 2111  NA 134*  K 3.6  CL 99*  CO2 23  GLUCOSE 100*  BUN 7  CREATININE 0.45*  CALCIUM 8.1*   GFR: CrCl cannot be calculated (Unknown ideal weight.).  Liver Function Tests: No results for input(s): AST, ALT, ALKPHOS, BILITOT, PROT, ALBUMIN in the last 168 hours. No results for input(s): LIPASE, AMYLASE in the last 168 hours. No results for input(s): AMMONIA in the last 168 hours. Coagulation Profile:  Recent Labs Lab 12/19/16 0050  INR 1.40   Cardiac Enzymes: No results for input(s): CKTOTAL, CKMB, CKMBINDEX, TROPONINI in the last 168 hours. BNP (last 3 results) No results for input(s): PROBNP in the last 8760 hours. HbA1C: No results for input(s): HGBA1C in the last 72 hours. CBG: No results for input(s): GLUCAP in the last 168 hours. Lipid Profile: No results for input(s): CHOL, HDL, LDLCALC, TRIG, CHOLHDL, LDLDIRECT in the last 72 hours. Thyroid Function Tests: No results for input(s): TSH, T4TOTAL, FREET4, T3FREE, THYROIDAB in the last 72 hours. Anemia Panel:  Recent Labs  12/19/16 0050  VITAMINB12 499  FOLATE 10.4  FERRITIN 3,578*  TIBC 176*  IRON 52  RETICCTPCT 3.9*   Sepsis Labs: Invalid input(s): PROCALCITONIN, LACTICIDVEN No results found for this or any previous visit (from the past 240 hour(s)).       Radiological Exams on Admission: Personally reviewed MRI reports: Mr Cervical Spine W Wo Contrast  Result Date: 12/19/2016 CLINICAL DATA:  Progressive back pain for 6 months, burning sensation to lower extremities for several months. History of metastatic prostate cancer. EXAM: MRI TOTAL SPINE WITHOUT AND WITH CONTRAST TECHNIQUE: Multisequence multiplanar MR imaging of the spine from the cervical spine to the sacrum was performed prior to and following IV contrast administration for evaluation of spinal metastatic disease. CONTRAST:  1mL MULTIHANCE GADOBENATE DIMEGLUMINE 529 MG/ML IV SOLN  COMPARISON:  MRI of the lumbar spine November 09, 2016 FINDINGS: MRI CERVICAL SPINE FINDINGS ALIGNMENT: Straightened cervical lordosis.  No malalignment. VERTEBRAE/DISCS: Vertebral bodies are intact. Severe C3-4, moderate C4-5, moderate C5-6, moderate to severe C6-7, severe 7 C7-T1 disc height loss with moderate multilevel chronic discogenic endplate changes. No STIR signal abnormality to suggest acute fracture. Diffusely low bone marrow signal with multiple low T1, bright STIR enhancing metastasis throughout the cervical spine. Heterogeneous calvarium consistent with metastatic disease. CORD:Subcentimeter focus of T2 bright signal within the LEFT greater than RIGHT cervical spinal cord at C4 associated with cord compression. No syrinx, no abnormal cord, leptomeningeal or epidural enhancement. POSTERIOR FOSSA, VERTEBRAL ARTERIES, PARASPINAL TISSUES: No MR findings of ligamentous injury. Vertebral artery flow voids present. Included posterior fossa and paraspinal soft tissues are nonsuspicious. DISC LEVELS: C2-3: Small broad-based disc bulge asymmetric to the RIGHT. Uncovertebral hypertrophy. Severe RIGHT, mild LEFT facet arthropathy. No canal stenosis. Severe RIGHT, mild LEFT neural foraminal narrowing. C3-4: 2 mm broad-based disc bulge, uncovertebral hypertrophy mild facet arthropathy. Severe canal stenosis, AP dimension of the canal is 6 mm. Severe bilateral neural foraminal narrowing. C4-5: Small broad-based disc bulge, uncovertebral hypertrophy and mild facet arthropathy. No canal stenosis. Severe bilateral neural foraminal narrowing. C5-6: Small broad-based disc bulge, uncovertebral hypertrophy and mild facet arthropathy. No canal stenosis. Moderate RIGHT, and moderate to severe LEFT neural foraminal narrowing. C6-7: Small broad-based disc bulge, uncovertebral hypertrophy and mild facet arthropathy. No canal stenosis. Moderate bilateral neural foraminal narrowing. C7-T1: Small broad-based disc bulge,  uncovertebral hypertrophy mild facet arthropathy. No canal stenosis. Moderate to severe bilateral neural foraminal narrowing. MRI THORACIC SPINE FINDINGS ALIGNMENT: Maintenance of the thoracic kyphosis. No malalignment. VERTEBRAE/DISCS: Diffusely low bone marrow signal with multiple low T1, bright STIR enhancing metastasis throughout the thoracic spine. Tumoral expansion of T4, T5, T6 and T7 middle column. Tumoral expansion RIGHT T7 pedicle and facet, LEFT T8 pedicle. Tumoral invasion of the RIGHT epidural space from T5-6 2 T7-8, extending into the neural foramen, RIGHT lateral posterior pleural space and paraspinal soft tissues, RIGHT T7 rib. No fracture deformity. Metastasis in all included ribs, including multifocal tumoral expansion. CORD: Multilevel cord compression, however are no cord edema. No abnormal cord, leptomeningeal enhancement. PREVERTEBRAL AND PARASPINAL SOFT TISSUES:  Nonacute. DISC LEVELS: At T6-7 and T7 RIGHT pedicle and epidural tumor effaces the thecal sac and mildly deforms the spinal cord. Mild canal stenosis. Tumor completely effaces the RIGHT C6-7 neural foramen. Tumor mildly narrows the RIGHT T7-8 neural foramen. Lower lumbar small broad-based disc bulges and facet arthropathy. Mild-to-moderate T9-10 neural foraminal narrowing. MRI LUMBAR SPINE FINDINGS- moderately motion degraded examination. SEGMENTATION: For the purposes of this report, the last well-formed intervertebral disc will be described as L5-S1. ALIGNMENT: Maintenance of the lumbar lordosis. No malalignment. VERTEBRAE:Diffusely low bone marrow signal with multiple  low T1, bright STIR enhancing metastasis throughout the lumbar spine. No pathologic fracture. Moderate to severe L5-S1 disc height loss. Multilevel mild desiccation. Moderate to severe chronic discogenic endplate changes 075-GRM, mild at the remaining lumbar levels. Expansile tumor RIGHT iliac bone invading the gluteal and paraspinal muscles. Congenital canal narrowing  on the basis of foreshortened pedicles. Epidural lipomatosis. CONUS MEDULLARIS: Conus medullaris terminates at L1-2 and demonstrates normal morphology and signal characteristics. 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 No abnormal cord, leptomeningeal enhancement. No epidural tumor. PARASPINAL AND SOFT TISSUES: Paraspinal muscle bright interstitial STIR signal and enhancement most consistent with denervation. 2 cm gallstone. DISC LEVELS: L1-2: Annular bulging. Moderate facet arthropathy and ligamentum flavum redundancy without canal stenosis or neural foraminal narrowing. L2-3: 3 mm broad-based disc bulge. Moderate to severe facet arthropathy and ligamentum flavum redundancy. Epidural lipomatosis. Moderate osseous canal stenosis and severe thecal sac effacement, 5 mm. RIGHT lateral recess stenosis may affect the traversing RIGHT L3 nerve. Mild RIGHT neural foraminal narrowing. L3-4: Small broad-based disc bulge. Moderate to severe facet arthropathy and ligamentum flavum redundancy. Trace facet effusions which are likely reactive. Moderate canal stenosis. Epidural lipomatosis. Severe effacement thecal sac narrowed to 4 mm. Mild bilateral neural foraminal narrowing. L4-5: 2 mm broad-based disc bulge. Moderate RIGHT and severe LEFT facet arthropathy and ligamentum flavum redundancy with trace facet effusions which are likely reactive. Epidural lipomatosis. Moderate to severe canal stenosis, severe effacement thecal sac to 5 mm. LEFT lateral recess stenosis likely affects the traversing LEFT L5 nerve. Mild RIGHT, moderate LEFT neural foraminal narrowing. L5-S1: 5 mm broad-based disc bulge asymmetric to the RIGHT, enhancing central annular fissure. Severe RIGHT and moderate LEFT facet arthropathy and ligamentum flavum redundancy. Epidural lipomatosis. Mild canal stenosis with partially effaced thecal sac. Moderate to severe RIGHT, mild LEFT neural foraminal narrowing. Encroachment upon the  exited RIGHT L5 nerve. 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 Neural foraminal narrowing all cervical levels: Severe from C2-3 through C4-5. MRI THORACIC SPINE: Diffuse osseous metastasis without pathologic fracture. Tumoral expansion and epidural invasion at T6-7 results in mild canal stenosis and severe RIGHT T6-7 neural foraminal narrowing. Displaced thoracic spinal cord without cord compression. MRI LUMBAR SPINE: Diffuse osseous metastasis without pathologic fracture. Degenerative change and epidural lipomatosis result in severe thecal sac effacement L2-3 through L4-5. Moderate to severe canal stenosis L4-5, moderate at L2-3 and L3-4. Neural foraminal narrowing L2-3 through L5-S1: Moderate to severe on the RIGHT at L5-S1. Acute findings discussed with and reconfirmed by Dr.Rancour on 12/19/2016 at 1:27 am. Electronically Signed   By: Elon Alas M.D.   On: 12/19/2016 01:28   Mr Thoracic Spine W Wo Contrast  Result Date: 12/19/2016 CLINICAL DATA:  Progressive back pain for 6 months, burning sensation to lower extremities for several months. History of metastatic prostate cancer. EXAM: MRI TOTAL SPINE WITHOUT AND WITH CONTRAST TECHNIQUE: Multisequence multiplanar MR imaging of the spine from the cervical spine to the sacrum was performed prior to and following IV contrast administration for evaluation of spinal metastatic disease. CONTRAST:  31mL MULTIHANCE GADOBENATE DIMEGLUMINE 529 MG/ML IV SOLN COMPARISON:  MRI of the lumbar spine November 09, 2016 FINDINGS: MRI CERVICAL SPINE FINDINGS ALIGNMENT: Straightened cervical lordosis.  No malalignment. VERTEBRAE/DISCS: Vertebral bodies are intact. Severe C3-4, moderate C4-5, moderate C5-6, moderate to severe C6-7, severe 7 C7-T1 disc height loss with moderate multilevel chronic discogenic endplate  changes. No  STIR signal abnormality to suggest acute fracture. Diffusely low bone marrow signal with multiple low T1, bright STIR enhancing metastasis throughout the cervical spine. Heterogeneous calvarium consistent with metastatic disease. CORD:Subcentimeter focus of T2 bright signal within the LEFT greater than RIGHT cervical spinal cord at C4 associated with cord compression. No syrinx, no abnormal cord, leptomeningeal or epidural enhancement. POSTERIOR FOSSA, VERTEBRAL ARTERIES, PARASPINAL TISSUES: No MR findings of ligamentous injury. Vertebral artery flow voids present. Included posterior fossa and paraspinal soft tissues are nonsuspicious. DISC LEVELS: C2-3: Small broad-based disc bulge asymmetric to the RIGHT. Uncovertebral hypertrophy. Severe RIGHT, mild LEFT facet arthropathy. No canal stenosis. Severe RIGHT, mild LEFT neural foraminal narrowing. C3-4: 2 mm broad-based disc bulge, uncovertebral hypertrophy mild facet arthropathy. Severe canal stenosis, AP dimension of the canal is 6 mm. Severe bilateral neural foraminal narrowing. C4-5: Small broad-based disc bulge, uncovertebral hypertrophy and mild facet arthropathy. No canal stenosis. Severe bilateral neural foraminal narrowing. C5-6: Small broad-based disc bulge, uncovertebral hypertrophy and mild facet arthropathy. No canal stenosis. Moderate RIGHT, and moderate to severe LEFT neural foraminal narrowing. C6-7: Small broad-based disc bulge, uncovertebral hypertrophy and mild facet arthropathy. No canal stenosis. Moderate bilateral neural foraminal narrowing. C7-T1: Small broad-based disc bulge, uncovertebral hypertrophy mild facet arthropathy. No canal stenosis. Moderate to severe bilateral neural foraminal narrowing. MRI THORACIC SPINE FINDINGS ALIGNMENT: Maintenance of the thoracic kyphosis. No malalignment. VERTEBRAE/DISCS: Diffusely low bone marrow signal with multiple low T1, bright STIR enhancing metastasis throughout the thoracic spine. Tumoral expansion  of T4, T5, T6 and T7 middle column. Tumoral expansion RIGHT T7 pedicle and facet, LEFT T8 pedicle. Tumoral invasion of the RIGHT epidural space from T5-6 2 T7-8, extending into the neural foramen, RIGHT lateral posterior pleural space and paraspinal soft tissues, RIGHT T7 rib. No fracture deformity. Metastasis in all included ribs, including multifocal tumoral expansion. CORD: Multilevel cord compression, however are no cord edema. No abnormal cord, leptomeningeal enhancement. PREVERTEBRAL AND PARASPINAL SOFT TISSUES:  Nonacute. DISC LEVELS: At T6-7 and T7 RIGHT pedicle and epidural tumor effaces the thecal sac and mildly deforms the spinal cord. Mild canal stenosis. Tumor completely effaces the RIGHT C6-7 neural foramen. Tumor mildly narrows the RIGHT T7-8 neural foramen. Lower lumbar small broad-based disc bulges and facet arthropathy. Mild-to-moderate T9-10 neural foraminal narrowing. MRI LUMBAR SPINE FINDINGS- moderately motion degraded examination. SEGMENTATION: For the purposes of this report, the last well-formed intervertebral disc will be described as L5-S1. ALIGNMENT: Maintenance of the lumbar lordosis. No malalignment. VERTEBRAE:Diffusely low bone marrow signal with multiple low T1, bright STIR enhancing metastasis throughout the lumbar spine. No pathologic fracture. Moderate to severe L5-S1 disc height loss. Multilevel mild desiccation. Moderate to severe chronic discogenic endplate changes 075-GRM, mild at the remaining lumbar levels. Expansile tumor RIGHT iliac bone invading the gluteal and paraspinal muscles. Congenital canal narrowing on the basis of foreshortened pedicles. Epidural lipomatosis. CONUS MEDULLARIS: Conus medullaris terminates at L1-2 and demonstrates normal morphology and signal characteristics. 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 No abnormal cord, leptomeningeal enhancement. No epidural tumor. PARASPINAL AND SOFT TISSUES: Paraspinal muscle bright  interstitial STIR signal and enhancement most consistent with denervation. 2 cm gallstone. DISC LEVELS: L1-2: Annular bulging. Moderate facet arthropathy and ligamentum flavum redundancy without canal stenosis or neural foraminal narrowing. L2-3: 3 mm broad-based disc bulge. Moderate to severe facet arthropathy and ligamentum flavum redundancy. Epidural lipomatosis. Moderate osseous canal stenosis and severe thecal sac effacement, 5 mm. RIGHT lateral recess stenosis may affect the traversing RIGHT  L3 nerve. Mild RIGHT neural foraminal narrowing. L3-4: Small broad-based disc bulge. Moderate to severe facet arthropathy and ligamentum flavum redundancy. Trace facet effusions which are likely reactive. Moderate canal stenosis. Epidural lipomatosis. Severe effacement thecal sac narrowed to 4 mm. Mild bilateral neural foraminal narrowing. L4-5: 2 mm broad-based disc bulge. Moderate RIGHT and severe LEFT facet arthropathy and ligamentum flavum redundancy with trace facet effusions which are likely reactive. Epidural lipomatosis. Moderate to severe canal stenosis, severe effacement thecal sac to 5 mm. LEFT lateral recess stenosis likely affects the traversing LEFT L5 nerve. Mild RIGHT, moderate LEFT neural foraminal narrowing. L5-S1: 5 mm broad-based disc bulge asymmetric to the RIGHT, enhancing central annular fissure. Severe RIGHT and moderate LEFT facet arthropathy and ligamentum flavum redundancy. Epidural lipomatosis. Mild canal stenosis with partially effaced thecal sac. Moderate to severe RIGHT, mild LEFT neural foraminal narrowing. Encroachment upon the exited RIGHT L5 nerve. 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 Neural foraminal narrowing all cervical levels: Severe from C2-3 through C4-5. MRI THORACIC SPINE: Diffuse osseous metastasis without pathologic fracture.  Tumoral expansion and epidural invasion at T6-7 results in mild canal stenosis and severe RIGHT T6-7 neural foraminal narrowing. Displaced thoracic spinal cord without cord compression. MRI LUMBAR SPINE: Diffuse osseous metastasis without pathologic fracture. Degenerative change and epidural lipomatosis result in severe thecal sac effacement L2-3 through L4-5. Moderate to severe canal stenosis L4-5, moderate at L2-3 and L3-4. Neural foraminal narrowing L2-3 through L5-S1: Moderate to severe on the RIGHT at L5-S1. Acute findings discussed with and reconfirmed by Dr.Rancour on 12/19/2016 at 1:27 am. Electronically Signed   By: Elon Alas M.D.   On: 12/19/2016 01:28   Mr Lumbar Spine W Wo Contrast  Result Date: 12/19/2016 CLINICAL DATA:  Progressive back pain for 6 months, burning sensation to lower extremities for several months. History of metastatic prostate cancer. EXAM: MRI TOTAL SPINE WITHOUT AND WITH CONTRAST TECHNIQUE: Multisequence multiplanar MR imaging of the spine from the cervical spine to the sacrum was performed prior to and following IV contrast administration for evaluation of spinal metastatic disease. CONTRAST:  42mL MULTIHANCE GADOBENATE DIMEGLUMINE 529 MG/ML IV SOLN COMPARISON:  MRI of the lumbar spine November 09, 2016 FINDINGS: MRI CERVICAL SPINE FINDINGS ALIGNMENT: Straightened cervical lordosis.  No malalignment. VERTEBRAE/DISCS: Vertebral bodies are intact. Severe C3-4, moderate C4-5, moderate C5-6, moderate to severe C6-7, severe 7 C7-T1 disc height loss with moderate multilevel chronic discogenic endplate changes. No STIR signal abnormality to suggest acute fracture. Diffusely low bone marrow signal with multiple low T1, bright STIR enhancing metastasis throughout the cervical spine. Heterogeneous calvarium consistent with metastatic disease. CORD:Subcentimeter focus of T2 bright signal within the LEFT greater than RIGHT cervical spinal cord at C4 associated with cord compression. No  syrinx, no abnormal cord, leptomeningeal or epidural enhancement. POSTERIOR FOSSA, VERTEBRAL ARTERIES, PARASPINAL TISSUES: No MR findings of ligamentous injury. Vertebral artery flow voids present. Included posterior fossa and paraspinal soft tissues are nonsuspicious. DISC LEVELS: C2-3: Small broad-based disc bulge asymmetric to the RIGHT. Uncovertebral hypertrophy. Severe RIGHT, mild LEFT facet arthropathy. No canal stenosis. Severe RIGHT, mild LEFT neural foraminal narrowing. C3-4: 2 mm broad-based disc bulge, uncovertebral hypertrophy mild facet arthropathy. Severe canal stenosis, AP dimension of the canal is 6 mm. Severe bilateral neural foraminal narrowing. C4-5: Small broad-based disc bulge, uncovertebral hypertrophy and mild facet arthropathy. No canal stenosis. Severe bilateral neural foraminal narrowing. C5-6: Small broad-based  disc bulge, uncovertebral hypertrophy and mild facet arthropathy. No canal stenosis. Moderate RIGHT, and moderate to severe LEFT neural foraminal narrowing. C6-7: Small broad-based disc bulge, uncovertebral hypertrophy and mild facet arthropathy. No canal stenosis. Moderate bilateral neural foraminal narrowing. C7-T1: Small broad-based disc bulge, uncovertebral hypertrophy mild facet arthropathy. No canal stenosis. Moderate to severe bilateral neural foraminal narrowing. MRI THORACIC SPINE FINDINGS ALIGNMENT: Maintenance of the thoracic kyphosis. No malalignment. VERTEBRAE/DISCS: Diffusely low bone marrow signal with multiple low T1, bright STIR enhancing metastasis throughout the thoracic spine. Tumoral expansion of T4, T5, T6 and T7 middle column. Tumoral expansion RIGHT T7 pedicle and facet, LEFT T8 pedicle. Tumoral invasion of the RIGHT epidural space from T5-6 2 T7-8, extending into the neural foramen, RIGHT lateral posterior pleural space and paraspinal soft tissues, RIGHT T7 rib. No fracture deformity. Metastasis in all included ribs, including multifocal tumoral expansion.  CORD: Multilevel cord compression, however are no cord edema. No abnormal cord, leptomeningeal enhancement. PREVERTEBRAL AND PARASPINAL SOFT TISSUES:  Nonacute. DISC LEVELS: At T6-7 and T7 RIGHT pedicle and epidural tumor effaces the thecal sac and mildly deforms the spinal cord. Mild canal stenosis. Tumor completely effaces the RIGHT C6-7 neural foramen. Tumor mildly narrows the RIGHT T7-8 neural foramen. Lower lumbar small broad-based disc bulges and facet arthropathy. Mild-to-moderate T9-10 neural foraminal narrowing. MRI LUMBAR SPINE FINDINGS- moderately motion degraded examination. SEGMENTATION: For the purposes of this report, the last well-formed intervertebral disc will be described as L5-S1. ALIGNMENT: Maintenance of the lumbar lordosis. No malalignment. VERTEBRAE:Diffusely low bone marrow signal with multiple low T1, bright STIR enhancing metastasis throughout the lumbar spine. No pathologic fracture. Moderate to severe L5-S1 disc height loss. Multilevel mild desiccation. Moderate to severe chronic discogenic endplate changes [id number], mild at the remaining lumbar levels. Expansile tumor RIGHT iliac bone invading the gluteal and paraspinal muscles. Congenital canal narrowing on the basis of foreshortened pedicles. Epidural lipomatosis. CONUS MEDULLARIS: Conus medullaris terminates at L1-2 and demonstrates normal morphology and signal characteristics. 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 No abnormal cord, leptomeningeal enhancement. No epidural tumor. PARASPINAL AND SOFT TISSUES: Paraspinal muscle bright interstitial STIR signal and enhancement most consistent with denervation. 2 cm gallstone. DISC LEVELS: L1-2: Annular bulging. Moderate facet arthropathy and ligamentum flavum redundancy without canal stenosis or neural foraminal narrowing. L2-3: 3 mm broad-based disc bulge. Moderate to severe facet arthropathy and ligamentum flavum redundancy. Epidural lipomatosis. Moderate  osseous canal stenosis and severe thecal sac effacement, 5 mm. RIGHT lateral recess stenosis may affect the traversing RIGHT L3 nerve. Mild RIGHT neural foraminal narrowing. L3-4: Small broad-based disc bulge. Moderate to severe facet arthropathy and ligamentum flavum redundancy. Trace facet effusions which are likely reactive. Moderate canal stenosis. Epidural lipomatosis. Severe effacement thecal sac narrowed to 4 mm. Mild bilateral neural foraminal narrowing. L4-5: 2 mm broad-based disc bulge. Moderate RIGHT and severe LEFT facet arthropathy and ligamentum flavum redundancy with trace facet effusions which are likely reactive. Epidural lipomatosis. Moderate to severe canal stenosis, severe effacement thecal sac to 5 mm. LEFT lateral recess stenosis likely affects the traversing LEFT L5 nerve. Mild RIGHT, moderate LEFT neural foraminal narrowing. L5-S1: 5 mm broad-based disc bulge asymmetric to the RIGHT, enhancing central annular fissure. Severe RIGHT and moderate LEFT facet arthropathy and ligamentum flavum redundancy. Epidural lipomatosis. Mild canal stenosis with partially effaced thecal sac. Moderate to severe RIGHT, mild LEFT neural foraminal narrowing. Encroachment upon the exited RIGHT L5 nerve. 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 Neural foraminal narrowing all cervical levels: Severe from C2-3 through C4-5. MRI THORACIC SPINE: Diffuse osseous metastasis without pathologic fracture. Tumoral expansion and epidural invasion at T6-7 results in mild canal stenosis and severe RIGHT T6-7 neural foraminal narrowing. Displaced thoracic spinal cord without cord compression. MRI LUMBAR SPINE: Diffuse osseous metastasis without pathologic fracture. Degenerative change and epidural lipomatosis result in severe thecal sac effacement L2-3 through L4-5. Moderate to  severe canal stenosis L4-5, moderate at L2-3 and L3-4. Neural foraminal narrowing L2-3 through L5-S1: Moderate to severe on the RIGHT at L5-S1. Acute findings discussed with and reconfirmed by Dr.Rancour on 12/19/2016 at 1:27 am. Electronically Signed   By: Elon Alas M.D.   On: 12/19/2016 01:28        Assessment/Plan  1. Myelopathy:  Unclear to me based on MRI report if this cord compression is related to tumor versus other degenerative changes to the bone, which is what the report seems to suggest. -Dexamethasone 4 mg q6 -Consult to Neurosurgery, appreciate recommendations    2. Anemia:  Unclear cause.  He has vague reports of rectal bleeding, but this is scant, and after self-disimpaction.  No melena.  Not really enough hematuria to explain this loss of Hgb.  Smoldering DIC a concern of Heme-Onc fellow. -Transfuse 1 unit now, repeat CBC after -Will check haptoglobin, LDH, bilirubin, coombs first -Check smear, fibrinogen, dimer -FOBT ordered  3. Hepatitis C:  -Check Hep C RNA  4. Other:  -Consult to SW for assistance with food at home, given his developing disability  5. Prostate cancer:  -Continue oxycodone -Continue MiraLAX, increase to BID -If no BM in 2 days, may need suppository -I/O cath PRN      DVT prophylaxis: SCDs  Code Status: FULL  Family Communication: None present  Disposition Plan: Anticipate dexamethasone and Neurosurgery consultation.  Manage pain.  Transfuse.  Consults called: Neurosurgery Admission status: INPATIENT        Medical decision making: Patient seen at 4:02 AM on 12/19/2016.  The patient was discussed with Dr. Wyvonnia Dusky.  What exists of the patient's chart was reviewed in depth and outside records were reviewed and summarized above.  Clinical condition: stable.        Edwin Dada [facility] Pager [phone]       At the time of admission, it appears that the appropriate admission status for this patient  is INPATIENT. This is judged to be reasonable and necessary in order to provide the required intensity of service to ensure the patient's safety given the presenting symptoms, physical exam findings, and initial radiographic and laboratory data in the context of their chronic comorbidities.  Together, these circumstances are felt to place him at high risk for further clinical deterioration threatening life, limb, or organ.   Patient requires inpatient status due to high intensity of service, high risk for further deterioration and high frequency of surveillance required because of this acute illness that poses a threat to life, limb or bodily function.  MRI findings of spinal cord compression resulting in leg weakness Hemoglobin 6.8, down from 12 one month ago  I certify that at the point of admission it is my clinical judgment that the patient will require inpatient hospital care spanning beyond 2 midnights from the point of admission and that early discharge would result in unnecessary risk of decompensation and readmission or threat to life, limb or bodily function.

## 2016-12-19 NOTE — Progress Notes (Signed)
1 unit of PRBC was admitted this morning and a new order to administer 3. 2/3 is ongoing at this time. Patient tolerated them well. No acute distress noted. Skin is warm and dry, respiration is even and unlabored.

## 2016-12-19 NOTE — ED Notes (Signed)
Paged Neurosurgery for the 3rd time- ty

## 2016-12-20 DIAGNOSIS — E43 Unspecified severe protein-calorie malnutrition: Secondary | ICD-10-CM

## 2016-12-20 DIAGNOSIS — C61 Malignant neoplasm of prostate: Secondary | ICD-10-CM

## 2016-12-20 DIAGNOSIS — B182 Chronic viral hepatitis C: Secondary | ICD-10-CM

## 2016-12-20 DIAGNOSIS — C7951 Secondary malignant neoplasm of bone: Secondary | ICD-10-CM

## 2016-12-20 DIAGNOSIS — E871 Hypo-osmolality and hyponatremia: Secondary | ICD-10-CM

## 2016-12-20 LAB — CBC
HCT: 30.8 % — ABNORMAL LOW (ref 39.0–52.0)
Hemoglobin: 10.3 g/dL — ABNORMAL LOW (ref 13.0–17.0)
MCH: 27.5 pg (ref 26.0–34.0)
MCHC: 33.4 g/dL (ref 30.0–36.0)
MCV: 82.1 fL (ref 78.0–100.0)
Platelets: 104 10*3/uL — ABNORMAL LOW (ref 150–400)
RBC: 3.75 MIL/uL — ABNORMAL LOW (ref 4.22–5.81)
RDW: 17.2 % — AB (ref 11.5–15.5)
WBC: 13.1 10*3/uL — ABNORMAL HIGH (ref 4.0–10.5)

## 2016-12-20 LAB — BASIC METABOLIC PANEL
Anion gap: 9 (ref 5–15)
BUN: 16 mg/dL (ref 6–20)
CHLORIDE: 102 mmol/L (ref 101–111)
CO2: 28 mmol/L (ref 22–32)
Calcium: 8.5 mg/dL — ABNORMAL LOW (ref 8.9–10.3)
Creatinine, Ser: 0.52 mg/dL — ABNORMAL LOW (ref 0.61–1.24)
GFR calc Af Amer: >60 mL/min (ref >60)
GFR calc non Af Amer: >60 mL/min (ref >60)
GLUCOSE: 132 mg/dL — AB (ref 65–99)
POTASSIUM: 4.7 mmol/L (ref 3.5–5.1)
Sodium: 139 mmol/L (ref 135–145)

## 2016-12-20 LAB — HCV RNA QUANT
HCV Quantitative Log: 6.029 log10 IU/mL (ref >1.70)
HCV Quantitative: 1070000 IU/mL (ref >50)

## 2016-12-20 LAB — URINALYSIS, ROUTINE W REFLEX MICROSCOPIC
BILIRUBIN URINE: NEGATIVE
GLUCOSE, UA: NEGATIVE mg/dL
HGB URINE DIPSTICK: NEGATIVE
Ketones, ur: NEGATIVE mg/dL
Leukocytes, UA: NEGATIVE
Nitrite: NEGATIVE
PROTEIN: NEGATIVE mg/dL
SPECIFIC GRAVITY, URINE: 1.021 (ref 1.005–1.030)
pH: 7 (ref 5.0–8.0)

## 2016-12-20 LAB — HAPTOGLOBIN: Haptoglobin: 441 mg/dL — ABNORMAL HIGH (ref 34–200)

## 2016-12-20 NOTE — Progress Notes (Signed)
PROGRESS NOTE  Ryan Ware  [id number] DOB: [birth year] DOA: 12/18/2016 PCP: No PCP Per Patient Outpatient Specialists:  Subjective: Feels better today, unclear if he can walk without problems, PT to evaluate.  Brief Narrative:  Ryan Ware is a 66 y.o. male with a past medical history significant for metastatic prostate cancer, hepatitis C who presents with leg weakness from his Urologist's office, concern for cord compression.  The patient has metastatic prostate cancer, planning to start palliative chemotherapy with [facility] in [place] soon.  Evidently the patient went to his oncology office at the [facility] a week or 2 ago for follow-up of his prostate cancer, was noted to have tachycardia and inability to move legs, was sent to the emergency room for evaluation for cord compression. He went to [facility] ED where they thought he was able to walk, performed no evaluation for cord compression and just gave him an antibiotic for UTI and discharged him. He did not get the antibiotic.  Today he went back to his oncologist, he had saddle paresthesias that had progressed, continued severe pain in the back, numbness in his buttocks and legs, and weakness with walking. He was referred again to the emergency room, this time at [facility], for evaluation of cord compression.  Assessment & Plan:   Principal Problem:   Cord compression [facility]) Active Problems:   Prostate cancer metastatic to bone (HCC)   Hyponatremia   Hepatitis C without hepatic coma   Protein-calorie malnutrition, severe   1. Myelopathy:  Unclear to me based on MRI report if this cord compression is related to tumor versus other degenerative changes to the bone, which is what the report seems to suggest. -Dexamethasone 4 mg q6 -I appreciate neurosurgery help, did not recommend any surgery for now as he does have multiple lesions. -Discussed with oncology Dr. Alvy Bimler, continue dexamethasone. -I have asked  PT to evaluate him. Ambulate today, evaluate the pain in a.m. if he is okay can be discharged home.  2. Anemia:  Unclear cause.  He has vague reports of rectal bleeding, but this is scant, and after self-disimpaction.  No melena.  Not really enough hematuria to explain this loss of Hgb.  Smoldering DIC a concern of Heme-Onc fellow. -FOBT is negative -His anemia is likely secondary to his malignancy, either chemotherapy related or marrow invasion. -Status post transfusion of 4 units of packed RBCs. Patient came in with hemoglobin of 6.8, hemoglobin is 10.3 now.  3. Hepatitis C:  -Check Hep C RNA  4. Other:  -Consult to SW for assistance with food at home, given his developing disability  5. Prostate cancer:  -Continue oxycodone -Continue MiraLAX, increase to BID -If no BM in 2 days, may need suppository -I/O cath PRN  6. Severe protein calorie malnutrition -In context of malignancy, patient has weight loss and cachexia. -Continue protein supplements.  7. Elevated d-dimer -Discussed with oncology, this is likely secondary to his malignancy. -No chest pain to consider PE (had recent CTA done on 11/14/2016) or lower extremity edema to look for DVT.   DVT prophylaxis:  Code Status: Full Code Family Communication:  Disposition Plan:  Diet: Diet regular Room service appropriate? Yes; Fluid consistency: Thin  Consultants:   Neurosurgery  Procedures:   None  Antimicrobials:   None  Objective: Vitals:   12/20/16 0038 12/20/16 0101 12/20/16 0301 12/20/16 0609  BP: 120/78 105/74 130/87 123/72  Pulse: 79 78 81 78  Resp:      Temp: 98.1 F (  36.7 C) 98.3 F (36.8 C) 98.2 F (36.8 C) 98.1 F (36.7 C)  TempSrc:  Oral Oral Oral  SpO2: 99% 100% 100% 100%  Height:        Intake/Output Summary (Last 24 hours) at 12/20/16 1137 Last data filed at 12/20/16 0830  Gross per 24 hour  Intake             1110 ml  Output                0 ml  Net             1110 ml    There were no vitals filed for this visit.  Examination: General exam: Appears calm and comfortable  Respiratory system: Clear to auscultation. Respiratory effort normal. Cardiovascular system: S1 & S2 heard, RRR. No JVD, murmurs, rubs, gallops or clicks. No pedal edema. Gastrointestinal system: Abdomen is nondistended, soft and nontender. No organomegaly or masses felt. Normal bowel sounds heard. Central nervous system: Alert and oriented. No focal neurological deficits. Extremities: Symmetric 5 x 5 power. Skin: No rashes, lesions or ulcers Psychiatry: Judgement and insight appear normal. Mood & affect appropriate.   Data Reviewed: I have personally reviewed following labs and imaging studies  CBC:  Recent Labs Lab 12/18/16 2111 12/20/16 1000  WBC 8.6 13.1*  NEUTROABS 4.8  --   HGB 6.8* 10.3*  HCT 21.6* 30.8*  MCV 83.1 82.1  PLT 103* [id number]*   Basic Metabolic Panel:  Recent Labs Lab 12/18/16 2111 12/20/16 1000  NA 134* 139  K 3.6 4.7  CL 99* 102  CO2 23 28  GLUCOSE 100* 132*  BUN 7 16  CREATININE 0.45* 0.52*  CALCIUM 8.1* 8.5*   GFR: CrCl cannot be calculated (Unknown ideal weight.). Liver Function Tests:  Recent Labs Lab 12/19/16 0923  AST 92*  ALT 35  ALKPHOS 269*  BILITOT 0.7  PROT 6.5  ALBUMIN 1.9*   No results for input(s): LIPASE, AMYLASE in the last 168 hours. No results for input(s): AMMONIA in the last 168 hours. Coagulation Profile:  Recent Labs Lab 12/19/16 0050  INR 1.40   Cardiac Enzymes: No results for input(s): CKTOTAL, CKMB, CKMBINDEX, TROPONINI in the last 168 hours. BNP (last 3 results) No results for input(s): PROBNP in the last 8760 hours. HbA1C: No results for input(s): HGBA1C in the last 72 hours. CBG: No results for input(s): GLUCAP in the last 168 hours. Lipid Profile: No results for input(s): CHOL, HDL, LDLCALC, TRIG, CHOLHDL, LDLDIRECT in the last 72 hours. Thyroid Function Tests: No results for input(s): TSH,  T4TOTAL, FREET4, T3FREE, THYROIDAB in the last 72 hours. Anemia Panel:  Recent Labs  12/19/16 0050  VITAMINB12 499  FOLATE 10.4  FERRITIN 3,578*  TIBC 176*  IRON 52  RETICCTPCT 3.9*   Urine analysis:    Component Value Date/Time   COLORURINE YELLOW 12/19/2016 0825   APPEARANCEUR CLEAR 12/19/2016 0825   LABSPEC 1.021 12/19/2016 0825   PHURINE 7.0 12/19/2016 0825   GLUCOSEU NEGATIVE 12/19/2016 0825   HGBUR NEGATIVE 12/19/2016 0825   BILIRUBINUR NEGATIVE 12/19/2016 0825   KETONESUR NEGATIVE 12/19/2016 0825   PROTEINUR NEGATIVE 12/19/2016 0825   NITRITE NEGATIVE 12/19/2016 0825   LEUKOCYTESUR NEGATIVE 12/19/2016 0825   Sepsis Labs: @LABRCNTIP (procalcitonin:4,lacticidven:4)  )No results found for this or any previous visit (from the past 240 hour(s)).   Invalid input(s): PROCALCITONIN, LACTICACIDVEN   Radiology Studies: Mr Cervical Spine W Wo Contrast  Result Date: 12/19/2016 CLINICAL DATA:  Progressive back pain  for 6 months, burning sensation to lower extremities for several months. History of metastatic prostate cancer. EXAM: MRI TOTAL SPINE WITHOUT AND WITH CONTRAST TECHNIQUE: Multisequence multiplanar MR imaging of the spine from the cervical spine to the sacrum was performed prior to and following IV contrast administration for evaluation of spinal metastatic disease. CONTRAST:  36mL MULTIHANCE GADOBENATE DIMEGLUMINE 529 MG/ML IV SOLN COMPARISON:  MRI of the lumbar spine November 09, 2016 FINDINGS: MRI CERVICAL SPINE FINDINGS ALIGNMENT: Straightened cervical lordosis.  No malalignment. VERTEBRAE/DISCS: Vertebral bodies are intact. Severe C3-4, moderate C4-5, moderate C5-6, moderate to severe C6-7, severe 7 C7-T1 disc height loss with moderate multilevel chronic discogenic endplate changes. No STIR signal abnormality to suggest acute fracture. Diffusely low bone marrow signal with multiple low T1, bright STIR enhancing metastasis throughout the cervical spine. Heterogeneous  calvarium consistent with metastatic disease. CORD:Subcentimeter focus of T2 bright signal within the LEFT greater than RIGHT cervical spinal cord at C4 associated with cord compression. No syrinx, no abnormal cord, leptomeningeal or epidural enhancement. POSTERIOR FOSSA, VERTEBRAL ARTERIES, PARASPINAL TISSUES: No MR findings of ligamentous injury. Vertebral artery flow voids present. Included posterior fossa and paraspinal soft tissues are nonsuspicious. DISC LEVELS: C2-3: Small broad-based disc bulge asymmetric to the RIGHT. Uncovertebral hypertrophy. Severe RIGHT, mild LEFT facet arthropathy. No canal stenosis. Severe RIGHT, mild LEFT neural foraminal narrowing. C3-4: 2 mm broad-based disc bulge, uncovertebral hypertrophy mild facet arthropathy. Severe canal stenosis, AP dimension of the canal is 6 mm. Severe bilateral neural foraminal narrowing. C4-5: Small broad-based disc bulge, uncovertebral hypertrophy and mild facet arthropathy. No canal stenosis. Severe bilateral neural foraminal narrowing. C5-6: Small broad-based disc bulge, uncovertebral hypertrophy and mild facet arthropathy. No canal stenosis. Moderate RIGHT, and moderate to severe LEFT neural foraminal narrowing. C6-7: Small broad-based disc bulge, uncovertebral hypertrophy and mild facet arthropathy. No canal stenosis. Moderate bilateral neural foraminal narrowing. C7-T1: Small broad-based disc bulge, uncovertebral hypertrophy mild facet arthropathy. No canal stenosis. Moderate to severe bilateral neural foraminal narrowing. MRI THORACIC SPINE FINDINGS ALIGNMENT: Maintenance of the thoracic kyphosis. No malalignment. VERTEBRAE/DISCS: Diffusely low bone marrow signal with multiple low T1, bright STIR enhancing metastasis throughout the thoracic spine. Tumoral expansion of T4, T5, T6 and T7 middle column. Tumoral expansion RIGHT T7 pedicle and facet, LEFT T8 pedicle. Tumoral invasion of the RIGHT epidural space from T5-6 2 T7-8, extending into the  neural foramen, RIGHT lateral posterior pleural space and paraspinal soft tissues, RIGHT T7 rib. No fracture deformity. Metastasis in all included ribs, including multifocal tumoral expansion. CORD: Multilevel cord compression, however are no cord edema. No abnormal cord, leptomeningeal enhancement. PREVERTEBRAL AND PARASPINAL SOFT TISSUES:  Nonacute. DISC LEVELS: At T6-7 and T7 RIGHT pedicle and epidural tumor effaces the thecal sac and mildly deforms the spinal cord. Mild canal stenosis. Tumor completely effaces the RIGHT C6-7 neural foramen. Tumor mildly narrows the RIGHT T7-8 neural foramen. Lower lumbar small broad-based disc bulges and facet arthropathy. Mild-to-moderate T9-10 neural foraminal narrowing. MRI LUMBAR SPINE FINDINGS- moderately motion degraded examination. SEGMENTATION: For the purposes of this report, the last well-formed intervertebral disc will be described as L5-S1. ALIGNMENT: Maintenance of the lumbar lordosis. No malalignment. VERTEBRAE:Diffusely low bone marrow signal with multiple low T1, bright STIR enhancing metastasis throughout the lumbar spine. No pathologic fracture. Moderate to severe L5-S1 disc height loss. Multilevel mild desiccation. Moderate to severe chronic discogenic endplate changes 075-GRM, mild at the remaining lumbar levels. Expansile tumor RIGHT iliac bone invading the gluteal and paraspinal muscles. Congenital canal narrowing on the basis  of foreshortened pedicles. Epidural lipomatosis. CONUS MEDULLARIS: Conus medullaris terminates at L1-2 and demonstrates normal morphology and signal characteristics. 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 No abnormal cord, leptomeningeal enhancement. No epidural tumor. PARASPINAL AND SOFT TISSUES: Paraspinal muscle bright interstitial STIR signal and enhancement most consistent with denervation. 2 cm gallstone. DISC LEVELS: L1-2: Annular bulging. Moderate facet arthropathy and ligamentum flavum redundancy  without canal stenosis or neural foraminal narrowing. L2-3: 3 mm broad-based disc bulge. Moderate to severe facet arthropathy and ligamentum flavum redundancy. Epidural lipomatosis. Moderate osseous canal stenosis and severe thecal sac effacement, 5 mm. RIGHT lateral recess stenosis may affect the traversing RIGHT L3 nerve. Mild RIGHT neural foraminal narrowing. L3-4: Small broad-based disc bulge. Moderate to severe facet arthropathy and ligamentum flavum redundancy. Trace facet effusions which are likely reactive. Moderate canal stenosis. Epidural lipomatosis. Severe effacement thecal sac narrowed to 4 mm. Mild bilateral neural foraminal narrowing. L4-5: 2 mm broad-based disc bulge. Moderate RIGHT and severe LEFT facet arthropathy and ligamentum flavum redundancy with trace facet effusions which are likely reactive. Epidural lipomatosis. Moderate to severe canal stenosis, severe effacement thecal sac to 5 mm. LEFT lateral recess stenosis likely affects the traversing LEFT L5 nerve. Mild RIGHT, moderate LEFT neural foraminal narrowing. L5-S1: 5 mm broad-based disc bulge asymmetric to the RIGHT, enhancing central annular fissure. Severe RIGHT and moderate LEFT facet arthropathy and ligamentum flavum redundancy. Epidural lipomatosis. Mild canal stenosis with partially effaced thecal sac. Moderate to severe RIGHT, mild LEFT neural foraminal narrowing. Encroachment upon the exited RIGHT L5 nerve. 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 Neural foraminal narrowing all cervical levels: Severe from C2-3 through C4-5. MRI THORACIC SPINE: Diffuse osseous metastasis without pathologic fracture. Tumoral expansion and epidural invasion at T6-7 results in mild canal stenosis and severe RIGHT T6-7 neural foraminal narrowing. Displaced thoracic spinal cord without cord compression.  MRI LUMBAR SPINE: Diffuse osseous metastasis without pathologic fracture. Degenerative change and epidural lipomatosis result in severe thecal sac effacement L2-3 through L4-5. Moderate to severe canal stenosis L4-5, moderate at L2-3 and L3-4. Neural foraminal narrowing L2-3 through L5-S1: Moderate to severe on the RIGHT at L5-S1. Acute findings discussed with and reconfirmed by Dr.Rancour on 12/19/2016 at 1:27 am. Electronically Signed   By: Elon Alas M.D.   On: 12/19/2016 01:28   Mr Thoracic Spine W Wo Contrast  Result Date: 12/19/2016 CLINICAL DATA:  Progressive back pain for 6 months, burning sensation to lower extremities for several months. History of metastatic prostate cancer. EXAM: MRI TOTAL SPINE WITHOUT AND WITH CONTRAST TECHNIQUE: Multisequence multiplanar MR imaging of the spine from the cervical spine to the sacrum was performed prior to and following IV contrast administration for evaluation of spinal metastatic disease. CONTRAST:  86mL MULTIHANCE GADOBENATE DIMEGLUMINE 529 MG/ML IV SOLN COMPARISON:  MRI of the lumbar spine November 09, 2016 FINDINGS: MRI CERVICAL SPINE FINDINGS ALIGNMENT: Straightened cervical lordosis.  No malalignment. VERTEBRAE/DISCS: Vertebral bodies are intact. Severe C3-4, moderate C4-5, moderate C5-6, moderate to severe C6-7, severe 7 C7-T1 disc height loss with moderate multilevel chronic discogenic endplate changes. No STIR signal abnormality to suggest acute fracture. Diffusely low bone marrow signal with multiple low T1, bright STIR enhancing metastasis throughout the cervical spine. Heterogeneous calvarium consistent with metastatic disease. CORD:Subcentimeter focus of T2 bright signal within the LEFT greater than RIGHT cervical spinal cord at C4 associated with cord compression. No  syrinx, no abnormal cord, leptomeningeal or epidural enhancement. POSTERIOR FOSSA, VERTEBRAL ARTERIES, PARASPINAL TISSUES: No MR findings of ligamentous injury. Vertebral artery flow  voids present. Included posterior fossa and paraspinal soft tissues are nonsuspicious. DISC LEVELS: C2-3: Small broad-based disc bulge asymmetric to the RIGHT. Uncovertebral hypertrophy. Severe RIGHT, mild LEFT facet arthropathy. No canal stenosis. Severe RIGHT, mild LEFT neural foraminal narrowing. C3-4: 2 mm broad-based disc bulge, uncovertebral hypertrophy mild facet arthropathy. Severe canal stenosis, AP dimension of the canal is 6 mm. Severe bilateral neural foraminal narrowing. C4-5: Small broad-based disc bulge, uncovertebral hypertrophy and mild facet arthropathy. No canal stenosis. Severe bilateral neural foraminal narrowing. C5-6: Small broad-based disc bulge, uncovertebral hypertrophy and mild facet arthropathy. No canal stenosis. Moderate RIGHT, and moderate to severe LEFT neural foraminal narrowing. C6-7: Small broad-based disc bulge, uncovertebral hypertrophy and mild facet arthropathy. No canal stenosis. Moderate bilateral neural foraminal narrowing. C7-T1: Small broad-based disc bulge, uncovertebral hypertrophy mild facet arthropathy. No canal stenosis. Moderate to severe bilateral neural foraminal narrowing. MRI THORACIC SPINE FINDINGS ALIGNMENT: Maintenance of the thoracic kyphosis. No malalignment. VERTEBRAE/DISCS: Diffusely low bone marrow signal with multiple low T1, bright STIR enhancing metastasis throughout the thoracic spine. Tumoral expansion of T4, T5, T6 and T7 middle column. Tumoral expansion RIGHT T7 pedicle and facet, LEFT T8 pedicle. Tumoral invasion of the RIGHT epidural space from T5-6 2 T7-8, extending into the neural foramen, RIGHT lateral posterior pleural space and paraspinal soft tissues, RIGHT T7 rib. No fracture deformity. Metastasis in all included ribs, including multifocal tumoral expansion. CORD: Multilevel cord compression, however are no cord edema. No abnormal cord, leptomeningeal enhancement. PREVERTEBRAL AND PARASPINAL SOFT TISSUES:  Nonacute. DISC LEVELS: At T6-7  and T7 RIGHT pedicle and epidural tumor effaces the thecal sac and mildly deforms the spinal cord. Mild canal stenosis. Tumor completely effaces the RIGHT C6-7 neural foramen. Tumor mildly narrows the RIGHT T7-8 neural foramen. Lower lumbar small broad-based disc bulges and facet arthropathy. Mild-to-moderate T9-10 neural foraminal narrowing. MRI LUMBAR SPINE FINDINGS- moderately motion degraded examination. SEGMENTATION: For the purposes of this report, the last well-formed intervertebral disc will be described as L5-S1. ALIGNMENT: Maintenance of the lumbar lordosis. No malalignment. VERTEBRAE:Diffusely low bone marrow signal with multiple low T1, bright STIR enhancing metastasis throughout the lumbar spine. No pathologic fracture. Moderate to severe L5-S1 disc height loss. Multilevel mild desiccation. Moderate to severe chronic discogenic endplate changes 075-GRM, mild at the remaining lumbar levels. Expansile tumor RIGHT iliac bone invading the gluteal and paraspinal muscles. Congenital canal narrowing on the basis of foreshortened pedicles. Epidural lipomatosis. CONUS MEDULLARIS: Conus medullaris terminates at L1-2 and demonstrates normal morphology and signal characteristics. 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 No abnormal cord, leptomeningeal enhancement. No epidural tumor. PARASPINAL AND SOFT TISSUES: Paraspinal muscle bright interstitial STIR signal and enhancement most consistent with denervation. 2 cm gallstone. DISC LEVELS: L1-2: Annular bulging. Moderate facet arthropathy and ligamentum flavum redundancy without canal stenosis or neural foraminal narrowing. L2-3: 3 mm broad-based disc bulge. Moderate to severe facet arthropathy and ligamentum flavum redundancy. Epidural lipomatosis. Moderate osseous canal stenosis and severe thecal sac effacement, 5 mm. RIGHT lateral recess stenosis may affect the traversing RIGHT L3 nerve. Mild RIGHT neural foraminal narrowing. L3-4: Small  broad-based disc bulge. Moderate to severe facet arthropathy and ligamentum flavum redundancy. Trace facet effusions which are likely reactive. Moderate canal stenosis. Epidural lipomatosis. Severe effacement thecal sac narrowed to 4 mm. Mild bilateral neural foraminal narrowing. L4-5: 2 mm broad-based disc bulge. Moderate RIGHT  and severe LEFT facet arthropathy and ligamentum flavum redundancy with trace facet effusions which are likely reactive. Epidural lipomatosis. Moderate to severe canal stenosis, severe effacement thecal sac to 5 mm. LEFT lateral recess stenosis likely affects the traversing LEFT L5 nerve. Mild RIGHT, moderate LEFT neural foraminal narrowing. L5-S1: 5 mm broad-based disc bulge asymmetric to the RIGHT, enhancing central annular fissure. Severe RIGHT and moderate LEFT facet arthropathy and ligamentum flavum redundancy. Epidural lipomatosis. Mild canal stenosis with partially effaced thecal sac. Moderate to severe RIGHT, mild LEFT neural foraminal narrowing. Encroachment upon the exited RIGHT L5 nerve. 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 Neural foraminal narrowing all cervical levels: Severe from C2-3 through C4-5. MRI THORACIC SPINE: Diffuse osseous metastasis without pathologic fracture. Tumoral expansion and epidural invasion at T6-7 results in mild canal stenosis and severe RIGHT T6-7 neural foraminal narrowing. Displaced thoracic spinal cord without cord compression. MRI LUMBAR SPINE: Diffuse osseous metastasis without pathologic fracture. Degenerative change and epidural lipomatosis result in severe thecal sac effacement L2-3 through L4-5. Moderate to severe canal stenosis L4-5, moderate at L2-3 and L3-4. Neural foraminal narrowing L2-3 through L5-S1: Moderate to severe on the RIGHT at L5-S1. Acute findings discussed with and reconfirmed  by Dr.Rancour on 12/19/2016 at 1:27 am. Electronically Signed   By: Elon Alas M.D.   On: 12/19/2016 01:28   Mr Lumbar Spine W Wo Contrast  Result Date: 12/19/2016 CLINICAL DATA:  Progressive back pain for 6 months, burning sensation to lower extremities for several months. History of metastatic prostate cancer. EXAM: MRI TOTAL SPINE WITHOUT AND WITH CONTRAST TECHNIQUE: Multisequence multiplanar MR imaging of the spine from the cervical spine to the sacrum was performed prior to and following IV contrast administration for evaluation of spinal metastatic disease. CONTRAST:  98mL MULTIHANCE GADOBENATE DIMEGLUMINE 529 MG/ML IV SOLN COMPARISON:  MRI of the lumbar spine November 09, 2016 FINDINGS: MRI CERVICAL SPINE FINDINGS ALIGNMENT: Straightened cervical lordosis.  No malalignment. VERTEBRAE/DISCS: Vertebral bodies are intact. Severe C3-4, moderate C4-5, moderate C5-6, moderate to severe C6-7, severe 7 C7-T1 disc height loss with moderate multilevel chronic discogenic endplate changes. No STIR signal abnormality to suggest acute fracture. Diffusely low bone marrow signal with multiple low T1, bright STIR enhancing metastasis throughout the cervical spine. Heterogeneous calvarium consistent with metastatic disease. CORD:Subcentimeter focus of T2 bright signal within the LEFT greater than RIGHT cervical spinal cord at C4 associated with cord compression. No syrinx, no abnormal cord, leptomeningeal or epidural enhancement. POSTERIOR FOSSA, VERTEBRAL ARTERIES, PARASPINAL TISSUES: No MR findings of ligamentous injury. Vertebral artery flow voids present. Included posterior fossa and paraspinal soft tissues are nonsuspicious. DISC LEVELS: C2-3: Small broad-based disc bulge asymmetric to the RIGHT. Uncovertebral hypertrophy. Severe RIGHT, mild LEFT facet arthropathy. No canal stenosis. Severe RIGHT, mild LEFT neural foraminal narrowing. C3-4: 2 mm broad-based disc bulge, uncovertebral hypertrophy mild facet  arthropathy. Severe canal stenosis, AP dimension of the canal is 6 mm. Severe bilateral neural foraminal narrowing. C4-5: Small broad-based disc bulge, uncovertebral hypertrophy and mild facet arthropathy. No canal stenosis. Severe bilateral neural foraminal narrowing. C5-6: Small broad-based disc bulge, uncovertebral hypertrophy and mild facet arthropathy. No canal stenosis. Moderate RIGHT, and moderate to severe LEFT neural foraminal narrowing. C6-7: Small broad-based disc bulge, uncovertebral hypertrophy and mild facet arthropathy. No canal stenosis. Moderate bilateral neural foraminal narrowing. C7-T1: Small broad-based disc bulge, uncovertebral hypertrophy mild facet arthropathy. No canal stenosis. Moderate  to severe bilateral neural foraminal narrowing. MRI THORACIC SPINE FINDINGS ALIGNMENT: Maintenance of the thoracic kyphosis. No malalignment. VERTEBRAE/DISCS: Diffusely low bone marrow signal with multiple low T1, bright STIR enhancing metastasis throughout the thoracic spine. Tumoral expansion of T4, T5, T6 and T7 middle column. Tumoral expansion RIGHT T7 pedicle and facet, LEFT T8 pedicle. Tumoral invasion of the RIGHT epidural space from T5-6 2 T7-8, extending into the neural foramen, RIGHT lateral posterior pleural space and paraspinal soft tissues, RIGHT T7 rib. No fracture deformity. Metastasis in all included ribs, including multifocal tumoral expansion. CORD: Multilevel cord compression, however are no cord edema. No abnormal cord, leptomeningeal enhancement. PREVERTEBRAL AND PARASPINAL SOFT TISSUES:  Nonacute. DISC LEVELS: At T6-7 and T7 RIGHT pedicle and epidural tumor effaces the thecal sac and mildly deforms the spinal cord. Mild canal stenosis. Tumor completely effaces the RIGHT C6-7 neural foramen. Tumor mildly narrows the RIGHT T7-8 neural foramen. Lower lumbar small broad-based disc bulges and facet arthropathy. Mild-to-moderate T9-10 neural foraminal narrowing. MRI LUMBAR SPINE FINDINGS-  moderately motion degraded examination. SEGMENTATION: For the purposes of this report, the last well-formed intervertebral disc will be described as L5-S1. ALIGNMENT: Maintenance of the lumbar lordosis. No malalignment. VERTEBRAE:Diffusely low bone marrow signal with multiple low T1, bright STIR enhancing metastasis throughout the lumbar spine. No pathologic fracture. Moderate to severe L5-S1 disc height loss. Multilevel mild desiccation. Moderate to severe chronic discogenic endplate changes [id number]-GRM, mild at the remaining lumbar levels. Expansile tumor RIGHT iliac bone invading the gluteal and paraspinal muscles. Congenital canal narrowing on the basis of foreshortened pedicles. Epidural lipomatosis. CONUS MEDULLARIS: Conus medullaris terminates at L1-2 and demonstrates normal morphology and signal characteristics. 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 No abnormal cord, leptomeningeal enhancement. No epidural tumor. PARASPINAL AND SOFT TISSUES: Paraspinal muscle bright interstitial STIR signal and enhancement most consistent with denervation. 2 cm gallstone. DISC LEVELS: L1-2: Annular bulging. Moderate facet arthropathy and ligamentum flavum redundancy without canal stenosis or neural foraminal narrowing. L2-3: 3 mm broad-based disc bulge. Moderate to severe facet arthropathy and ligamentum flavum redundancy. Epidural lipomatosis. Moderate osseous canal stenosis and severe thecal sac effacement, 5 mm. RIGHT lateral recess stenosis may affect the traversing RIGHT L3 nerve. Mild RIGHT neural foraminal narrowing. L3-4: Small broad-based disc bulge. Moderate to severe facet arthropathy and ligamentum flavum redundancy. Trace facet effusions which are likely reactive. Moderate canal stenosis. Epidural lipomatosis. Severe effacement thecal sac narrowed to 4 mm. Mild bilateral neural foraminal narrowing. L4-5: 2 mm broad-based disc bulge. Moderate RIGHT and severe LEFT facet arthropathy and  ligamentum flavum redundancy with trace facet effusions which are likely reactive. Epidural lipomatosis. Moderate to severe canal stenosis, severe effacement thecal sac to 5 mm. LEFT lateral recess stenosis likely affects the traversing LEFT L5 nerve. Mild RIGHT, moderate LEFT neural foraminal narrowing. L5-S1: 5 mm broad-based disc bulge asymmetric to the RIGHT, enhancing central annular fissure. Severe RIGHT and moderate LEFT facet arthropathy and ligamentum flavum redundancy. Epidural lipomatosis. Mild canal stenosis with partially effaced thecal sac. Moderate to severe RIGHT, mild LEFT neural foraminal narrowing. Encroachment upon the exited RIGHT L5 nerve. 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 Neural foraminal narrowing all cervical levels: Severe from C2-3 through C4-5. MRI THORACIC SPINE: Diffuse osseous metastasis without pathologic fracture. Tumoral expansion and epidural invasion at T6-7 results in mild canal stenosis and severe RIGHT T6-7 neural foraminal narrowing. Displaced thoracic  spinal cord without cord compression. MRI LUMBAR SPINE: Diffuse osseous metastasis without pathologic fracture. Degenerative change and epidural lipomatosis result in severe thecal sac effacement L2-3 through L4-5. Moderate to severe canal stenosis L4-5, moderate at L2-3 and L3-4. Neural foraminal narrowing L2-3 through L5-S1: Moderate to severe on the RIGHT at L5-S1. Acute findings discussed with and reconfirmed by Dr.Rancour on 12/19/2016 at 1:27 am. Electronically Signed   By: Elon Alas M.D.   On: 12/19/2016 01:28        Scheduled Meds: . dexamethasone  4 mg Intravenous Q6H  . feeding supplement (ENSURE ENLIVE)  237 mL Oral TID BM  . polyethylene glycol  17 g Oral BID   Continuous Infusions:   LOS: 1 day    Time spent: 35 minutes    Breyon Sigg  A, MD [facility] Pager [phone]  If 7PM-7AM, please contact night-coverage [email or web address] Password [employer] 12/20/2016, 11:37 AM

## 2016-12-20 NOTE — Progress Notes (Signed)
Third unit of blood completed at 0300 this morning.

## 2016-12-20 NOTE — Progress Notes (Signed)
Patient ID: Ryan Ware, male   DOB: 1951-03-25, 66 y.o.   MRN: [id number] Patient notes that he is feeling better. He has had the strength to walk to the restroom of his own accord today. Motor function testing reveals 4 out of 5 strength in the lower extremities and again extreme emaciation and muscle loss is noted in all his muscle groups. Physical therapy is to do more evaluation.  At this time I do not believe that acute surgical intervention is warranted given the widespread nature of his disease process. 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  I believe the focus should continue to remain on systemic therapy.  Please recall neurosurgery if further intervention or follow-up is necessary.

## 2016-12-21 LAB — TYPE AND SCREEN
ABO/RH(D): B POS
Antibody Screen: NEGATIVE
Unit division: 0
Unit division: 0
Unit division: 0
Unit division: 0

## 2016-12-21 LAB — URINE CULTURE: Culture: NO GROWTH

## 2016-12-21 LAB — CBC
HCT: 32 % — ABNORMAL LOW (ref 39.0–52.0)
Hemoglobin: 10.5 g/dL — ABNORMAL LOW (ref 13.0–17.0)
MCH: 27.6 pg (ref 26.0–34.0)
MCHC: 32.8 g/dL (ref 30.0–36.0)
MCV: 84 fL (ref 78.0–100.0)
PLATELETS: 114 10*3/uL — AB (ref 150–400)
RBC: 3.81 MIL/uL — ABNORMAL LOW (ref 4.22–5.81)
RDW: 18 % — AB (ref 11.5–15.5)
WBC: 13.6 10*3/uL — AB (ref 4.0–10.5)

## 2016-12-21 LAB — BASIC METABOLIC PANEL
Anion gap: 11 (ref 5–15)
BUN: 19 mg/dL (ref 6–20)
CO2: 27 mmol/L (ref 22–32)
CREATININE: 0.5 mg/dL — AB (ref 0.61–1.24)
Calcium: 8.8 mg/dL — ABNORMAL LOW (ref 8.9–10.3)
Chloride: 102 mmol/L (ref 101–111)
GFR calc Af Amer: >60 mL/min (ref >60)
GLUCOSE: 141 mg/dL — AB (ref 65–99)
Potassium: 4.1 mmol/L (ref 3.5–5.1)
SODIUM: 140 mmol/L (ref 135–145)

## 2016-12-21 MED ORDER — DEXAMETHASONE 1 MG PO TABS: ORAL_TABLET | ORAL | 0 refills | Status: DC

## 2016-12-21 NOTE — Care Management Note (Addendum)
Case Management Note  Patient Details  Name: Ryan Ware MRN: [id number] Date of Birth: [birth year]  Subjective/Objective:                  Back pain, leg numbness, bowel and bladder dysfunction, leg weakness Action/Plan: Discharge planning Expected Discharge Date:  12/21/16               Expected Discharge Plan:  [place]  In-House Referral:  Clinical Social Work  Discharge planning Services  CM Consult  Post Acute Care Choice:  Durable Medical Equipment, Home Health Choice offered to:  Patient  DME Arranged:  Walker rolling DME Agency:  [place]:  PT [place] Agency:  Other - See comment  Status of Service:  Completed, signed off  If discussed at [place] of Stay Meetings, dates discussed:    Additional Comments: CM met with pt to offer choice of home health agency and pt has requested [facility] take care of [place] rather than use his [facility].  CM notified [facility] DME rep, [name] to please deliver the rolling walker to room.  CM notified CSW Shelton Silvas to please provide taxi voucher home for pt.  CM faxed facesheet, face to face, home health orders, H&P, and DC summary  to [facility] [phone].  NO other CM needs were communicated. Dellie Catholic, RN 12/21/2016, 11:37 AM

## 2016-12-21 NOTE — Evaluation (Signed)
Physical Therapy Evaluation/Discharge Patient Details Name: Ryan Ware MRN: [id number] DOB: 10/21/1951 Today's Date: 12/21/2016   History of Present Illness  pt is a 66 yo male admitted through via [place] through ED on 12/18/16. pt presented at [place] with symptoms related to quada equina syndrome but this was ruled out with multiple imagine. 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 PMH signficant for Prostate CA with Mets, myelopathy, anemia and hep C.   Clinical Impression  Pt presents with the above diagnosis and below deficits. Prior to admission pt was living alone in a two story apartment. S/P 3 weeks pt has progressively become weaker requiring him to move his bedroom to main level and perform very short distance gait and mobility. Pt was spending the majority of his day in bed. Pt has above diagnosis which is debilitating him to the point where he not able to mobilize in the community without assistance. Discussed possibility of a WC or power WC if weakness continues and pt reports that he apartment is not large enough. Pt is considering moving to a handicap accessible apartment in the near future. Pt is receiving assistance through the [place] and may qualify for these benefits and advised pt to speak to his representative at the [place].  Pt will benefit from HHPT upon discharge in order to maximize his functional outcomes and assist with increasing his strength to assist with a return to PLOF. Pt is moving well in his room and will not require any additional acute follow-up. If his condition changes, please reorder therapy services.     Follow Up Recommendations Home health PT;Supervision - Intermittent    Equipment Recommendations  Rolling walker with 5" wheels;Other (comment) (Tall)    Recommendations for Other Services       Precautions / Restrictions Precautions Precautions: Fall Precaution  Comments: muscle weakness and difficulty with transfers  Restrictions Weight Bearing Restrictions: No      Mobility  Bed Mobility               General bed mobility comments: Pt OOB standing up in bathroom when PT arrives.   Transfers Overall transfer level: Needs assistance Equipment used: Rolling walker (2 wheeled) Transfers: Sit to/from Stand Sit to Stand: Mod assist (from lower surface)         General transfer comment: Requries Mod A to stand from lower surfaces due to weakness in LE's. Once standing pt is able to stand without assistance with RW or other AD.   Ambulation/Gait Ambulation/Gait assistance: Min guard Ambulation Distance (Feet): 25 Feet Assistive device: Rolling walker (2 wheeled) Gait Pattern/deviations: Step-through pattern;Trunk flexed Gait velocity: decreased Gait velocity interpretation: Below normal speed for age/gender General Gait Details: Pt with slow cadence, flexed trunk and knee with gait in order to stabilize.   Stairs            Wheelchair Mobility    Modified Rankin (Stroke Patients Only)       Balance Overall balance assessment: Needs assistance Sitting-balance support: No upper extremity supported;Feet supported Sitting balance-Leahy Scale: Good     Standing balance support: No upper extremity supported;During functional activity Standing balance-Leahy Scale: Fair Standing balance comment: Is standing in bathroom at [place] and washing up, but strength fades quickly.  Pertinent Vitals/Pain Pain Assessment: 0-10 Pain Score: 8  Pain Location: left lower back Pain Descriptors / Indicators: Aching;Guarding;Grimacing Pain Intervention(s): Limited activity within patient's tolerance;Monitored during session;Premedicated before session    Home Living Family/patient expects to be discharged to:: Private residence Living Arrangements: Alone Available Help at Discharge:  Family;Available PRN/intermittently;Other (Comment) (brother is power of attorny) Type of Home: Apartment Home Access: Level entry     Home Layout: Two level;Able to live on main level with bedroom/bathroom Home Equipment: Bedside commode      Prior Function Level of Independence: Independent         Comments: was completely independent prior to admission and recently retired.      Hand Dominance   Dominant Hand: Right    Extremity/Trunk Assessment   Upper Extremity Assessment Upper Extremity Assessment: Overall WFL for tasks assessed    Lower Extremity Assessment Lower Extremity Assessment: Generalized weakness    Cervical / Trunk Assessment Cervical / Trunk Assessment: Kyphotic  Communication   Communication: No difficulties  Cognition Arousal/Alertness: Awake/alert Behavior During Therapy: WFL for tasks assessed/performed Overall Cognitive Status: Within Functional Limits for tasks assessed                      General Comments      Exercises     Assessment/Plan    PT Assessment All further PT needs can be met in the next venue of care  PT Problem List Decreased strength;Decreased activity tolerance;Decreased balance;Decreased mobility;Decreased knowledge of use of DME;Decreased safety awareness          PT Treatment Interventions      PT Goals (Current goals can be found in the Care Plan section)       Frequency     Barriers to discharge Decreased caregiver support Pt has his brother who is assisting, but will require futher assistance in the near future.     Co-evaluation               End of Session Equipment Utilized During Treatment: Gait belt Activity Tolerance: Patient limited by fatigue Patient left: in chair;with call bell/phone within reach;with chair alarm set Nurse Communication: Mobility status         Time: 5681-2751 PT Time Calculation (min) (ACUTE ONLY): 39 min   Charges:   PT Evaluation $PT Eval  Moderate Complexity: 1 Procedure PT Treatments $Gait Training: 8-22 mins $Therapeutic Activity: 8-22 mins   PT G Codes:        Scheryl Marten PT, DPT  [phone]  12/21/2016, 10:35 AM

## 2016-12-21 NOTE — Discharge Summary (Signed)
Physician Discharge Summary  Ryan Ware [id number] DOB: Jan 02, 1951 DOA: 12/18/2016  PCP: [name]  Admit date: 12/18/2016 Discharge date: 12/21/2016  Admitted From: Home Disposition: Home  Recommendations for Outpatient Follow-up:  1. Follow up with PCP in 1-2 weeks 2. Please obtain BMP/CBC in one week  Home Health: PT Equipment/Devices: Rolling walker  Discharge Condition: Stable CODE STATUS: Full Code Diet recommendation: Diet regular Room service appropriate? Yes; Fluid consistency: Thin Diet - low sodium heart healthy  Brief/Interim Summary: Ryan Moschella Winchesteris a 66 y.o.malewith a past medical history significant for metastatic prostate cancer, hepatitis Cwho presents with leg weakness from his Urologist's office, concern for cord compression.  The patient has metastatic prostate cancer, planning to start palliative chemotherapy with [facility] in [place] soon. Evidently the patient went to his oncology office at the [facility] a week or 2 ago for follow-up of his prostate cancer, was noted to have tachycardia and inability to move legs, was sent to the emergency room for evaluation for cord compression. He went to [facility] ED where they thought he was able to walk, performed no evaluation for cord compression and just gave him an antibiotic for UTI and discharged him. He did not get the antibiotic.  Today he went back to his oncologist, he had saddle paresthesias that hadprogressed, continued severe pain in the back, numbness in his buttocks and legs, and weakness with walking. He was referred again to the emergency room, this time at [facility], for evaluation of cord compression.   Discharge Diagnoses:  Principal Problem:   Cord compression [facility]) Active Problems:   Prostate cancer metastatic to bone (HCC)   Hyponatremia   Hepatitis C without hepatic coma   Protein-calorie malnutrition, severe  Patient reported numbness in his both lower  extremities for some time, but recently got worse with weakness and was not able to walk okay. I think his numbness/tingling is chronic and to follow-up generalized weakness and couldn't walk because of the profound anemia he had when he came in. As soon as the anemia was corrected with blood transfusion patient was able to get up and walk around without any problems, he had constipation which is probably opioid-induced constipation.  1. Myelopathy: -Patient presented to the hospital with gait disturbances and question of cauda equina syndrome. -MRI was done and showed numerous lesions without severe cord compression. -Neurosurgery was consulted, seen by Dr. Ellene Route, did not recommend surgical intervention due to multiple lesions. -Discussed with Dr. Alvy Bimler of medical oncology over the phone, no need to repeat the workup. -Recommended dexamethasone, patient treated with 4 mg of dexamethasone every 6 hours while he was in the hospital. -On discharge discharged on dexamethasone taper until he follows with his oncologist. -No severe bone pain or cord compression to indicate radiation therapy. -Was able to walk with PT, recommended walker and home PT, patient to follow-up with PCP.  2. Anemia: -Unclear cause, FOBT is negative,?Smoldering DIC versus effect of malignancy likely by [facility] invasion. -Status post transfusion of 4 units of packed RBCs. Patient came in with hemoglobin of 6.8, hemoglobin is 10.5 on D/C -This is likely the main cause of his generalized weakness and gait disturbances when he came in.  3. Hepatitis C: -Hep C RNA pending.  4. Prostate cancer: -Continue oxycodone -Continue MiraLAX, increase to BID -If no BM in 2 days, may need suppository -I/O cath PRN  4. Severe protein calorie malnutrition -In context of malignancy, patient has weight loss and cachexia. -Continue protein supplements.  6. Elevated d-dimer -Discussed with oncology, this is likely secondary to  his malignancy. -No chest pain to consider PE (had recent CTA done on 11/14/2016) or lower extremity edema to look for DVT.  7. Constipation -Also as part of his symptoms reported that he is constipated, initially this was thought to be consistent with cauda equina syndrome. -After further questioning patient reported that he developed constipation after he was started on Percocet by his oncologist. -I think this is likely OIC. This is resolved after started on laxative.   Discharge Instructions  Discharge Instructions    Diet - low sodium heart healthy    Complete by:  As directed    Increase activity slowly    Complete by:  As directed      Allergies as of 12/21/2016      Reactions   Acetaminophen Other (See Comments)   Liver problems      Medication List    TAKE these medications   dexamethasone 1 MG tablet Commonly known as:  DECADRON Take 4 tabs for 2 days, then take 3 tabs for 2 days, then take 2 tabs for 2 days, then take 1 tab for 2 days, then stop.   oxyCODONE-acetaminophen 5-325 MG tablet Commonly known as:  PERCOCET Take 1-2 tablets by mouth every 4 (four) hours as needed. What changed:  how much to take   polyethylene glycol packet Commonly known as:  MIRALAX / GLYCOLAX Take 17 g by mouth daily as needed.            Durable Medical Equipment        Start     Ordered   12/21/16 1007  For home use only DME Walker rolling  [facility])  Once    Question:  Patient needs a walker to treat with the following condition  Answer:  Gait disturbance   12/21/16 1007      Allergies  Allergen Reactions  . Acetaminophen Other (See Comments)    Liver problems    Consultations:  Neurosurgery   Procedures (Echo, Carotid, EGD, Colonoscopy, ERCP)   Radiological studies: Mr Cervical Spine W Wo Contrast  Result Date: 12/19/2016 CLINICAL DATA:  Progressive back pain for 6 months, burning sensation to lower extremities for several months. History of metastatic  prostate cancer. EXAM: MRI TOTAL SPINE WITHOUT AND WITH CONTRAST TECHNIQUE: Multisequence multiplanar MR imaging of the spine from the cervical spine to the sacrum was performed prior to and following IV contrast administration for evaluation of spinal metastatic disease. CONTRAST:  95mL MULTIHANCE GADOBENATE DIMEGLUMINE 529 MG/ML IV SOLN COMPARISON:  MRI of the lumbar spine November 09, 2016 FINDINGS: MRI CERVICAL SPINE FINDINGS ALIGNMENT: Straightened cervical lordosis.  No malalignment. VERTEBRAE/DISCS: Vertebral bodies are intact. Severe C3-4, moderate C4-5, moderate C5-6, moderate to severe C6-7, severe 7 C7-T1 disc height loss with moderate multilevel chronic discogenic endplate changes. No STIR signal abnormality to suggest acute fracture. Diffusely low bone marrow signal with multiple low T1, bright STIR enhancing metastasis throughout the cervical spine. Heterogeneous calvarium consistent with metastatic disease. CORD:Subcentimeter focus of T2 bright signal within the LEFT greater than RIGHT cervical spinal cord at C4 associated with cord compression. No syrinx, no abnormal cord, leptomeningeal or epidural enhancement. POSTERIOR FOSSA, VERTEBRAL ARTERIES, PARASPINAL TISSUES: No MR findings of ligamentous injury. Vertebral artery flow voids present. Included posterior fossa and paraspinal soft tissues are nonsuspicious. DISC LEVELS: C2-3: Small broad-based disc bulge asymmetric to the RIGHT. Uncovertebral hypertrophy. Severe RIGHT, mild LEFT facet arthropathy. No canal stenosis.  Severe RIGHT, mild LEFT neural foraminal narrowing. C3-4: 2 mm broad-based disc bulge, uncovertebral hypertrophy mild facet arthropathy. Severe canal stenosis, AP dimension of the canal is 6 mm. Severe bilateral neural foraminal narrowing. C4-5: Small broad-based disc bulge, uncovertebral hypertrophy and mild facet arthropathy. No canal stenosis. Severe bilateral neural foraminal narrowing. C5-6: Small broad-based disc bulge,  uncovertebral hypertrophy and mild facet arthropathy. No canal stenosis. Moderate RIGHT, and moderate to severe LEFT neural foraminal narrowing. C6-7: Small broad-based disc bulge, uncovertebral hypertrophy and mild facet arthropathy. No canal stenosis. Moderate bilateral neural foraminal narrowing. C7-T1: Small broad-based disc bulge, uncovertebral hypertrophy mild facet arthropathy. No canal stenosis. Moderate to severe bilateral neural foraminal narrowing. MRI THORACIC SPINE FINDINGS ALIGNMENT: Maintenance of the thoracic kyphosis. No malalignment. VERTEBRAE/DISCS: Diffusely low bone marrow signal with multiple low T1, bright STIR enhancing metastasis throughout the thoracic spine. Tumoral expansion of T4, T5, T6 and T7 middle column. Tumoral expansion RIGHT T7 pedicle and facet, LEFT T8 pedicle. Tumoral invasion of the RIGHT epidural space from T5-6 2 T7-8, extending into the neural foramen, RIGHT lateral posterior pleural space and paraspinal soft tissues, RIGHT T7 rib. No fracture deformity. Metastasis in all included ribs, including multifocal tumoral expansion. CORD: Multilevel cord compression, however are no cord edema. No abnormal cord, leptomeningeal enhancement. PREVERTEBRAL AND PARASPINAL SOFT TISSUES:  Nonacute. DISC LEVELS: At T6-7 and T7 RIGHT pedicle and epidural tumor effaces the thecal sac and mildly deforms the spinal cord. Mild canal stenosis. Tumor completely effaces the RIGHT C6-7 neural foramen. Tumor mildly narrows the RIGHT T7-8 neural foramen. Lower lumbar small broad-based disc bulges and facet arthropathy. Mild-to-moderate T9-10 neural foraminal narrowing. MRI LUMBAR SPINE FINDINGS- moderately motion degraded examination. SEGMENTATION: For the purposes of this report, the last well-formed intervertebral disc will be described as L5-S1. ALIGNMENT: Maintenance of the lumbar lordosis. No malalignment. VERTEBRAE:Diffusely low bone marrow signal with multiple low T1, bright STIR enhancing  metastasis throughout the lumbar spine. No pathologic fracture. Moderate to severe L5-S1 disc height loss. Multilevel mild desiccation. Moderate to severe chronic discogenic endplate changes 075-GRM, mild at the remaining lumbar levels. Expansile tumor RIGHT iliac bone invading the gluteal and paraspinal muscles. Congenital canal narrowing on the basis of foreshortened pedicles. Epidural lipomatosis. CONUS MEDULLARIS: Conus medullaris terminates at L1-2 and demonstrates normal morphology and signal characteristics. 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 No abnormal cord, leptomeningeal enhancement. No epidural tumor. PARASPINAL AND SOFT TISSUES: Paraspinal muscle bright interstitial STIR signal and enhancement most consistent with denervation. 2 cm gallstone. DISC LEVELS: L1-2: Annular bulging. Moderate facet arthropathy and ligamentum flavum redundancy without canal stenosis or neural foraminal narrowing. L2-3: 3 mm broad-based disc bulge. Moderate to severe facet arthropathy and ligamentum flavum redundancy. Epidural lipomatosis. Moderate osseous canal stenosis and severe thecal sac effacement, 5 mm. RIGHT lateral recess stenosis may affect the traversing RIGHT L3 nerve. Mild RIGHT neural foraminal narrowing. L3-4: Small broad-based disc bulge. Moderate to severe facet arthropathy and ligamentum flavum redundancy. Trace facet effusions which are likely reactive. Moderate canal stenosis. Epidural lipomatosis. Severe effacement thecal sac narrowed to 4 mm. Mild bilateral neural foraminal narrowing. L4-5: 2 mm broad-based disc bulge. Moderate RIGHT and severe LEFT facet arthropathy and ligamentum flavum redundancy with trace facet effusions which are likely reactive. Epidural lipomatosis. Moderate to severe canal stenosis, severe effacement thecal sac to 5 mm. LEFT lateral recess stenosis likely affects the traversing LEFT L5 nerve. Mild RIGHT, moderate LEFT neural foraminal narrowing. L5-S1:  5 mm broad-based disc bulge asymmetric  to the RIGHT, enhancing central annular fissure. Severe RIGHT and moderate LEFT facet arthropathy and ligamentum flavum redundancy. Epidural lipomatosis. Mild canal stenosis with partially effaced thecal sac. Moderate to severe RIGHT, mild LEFT neural foraminal narrowing. Encroachment upon the exited RIGHT L5 nerve. 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 Neural foraminal narrowing all cervical levels: Severe from C2-3 through C4-5. MRI THORACIC SPINE: Diffuse osseous metastasis without pathologic fracture. Tumoral expansion and epidural invasion at T6-7 results in mild canal stenosis and severe RIGHT T6-7 neural foraminal narrowing. Displaced thoracic spinal cord without cord compression. MRI LUMBAR SPINE: Diffuse osseous metastasis without pathologic fracture. Degenerative change and epidural lipomatosis result in severe thecal sac effacement L2-3 through L4-5. Moderate to severe canal stenosis L4-5, moderate at L2-3 and L3-4. Neural foraminal narrowing L2-3 through L5-S1: Moderate to severe on the RIGHT at L5-S1. Acute findings discussed with and reconfirmed by Dr.Rancour on 12/19/2016 at 1:27 am. Electronically Signed   By: Elon Alas M.D.   On: 12/19/2016 01:28   Mr Thoracic Spine W Wo Contrast  Result Date: 12/19/2016 CLINICAL DATA:  Progressive back pain for 6 months, burning sensation to lower extremities for several months. History of metastatic prostate cancer. EXAM: MRI TOTAL SPINE WITHOUT AND WITH CONTRAST TECHNIQUE: Multisequence multiplanar MR imaging of the spine from the cervical spine to the sacrum was performed prior to and following IV contrast administration for evaluation of spinal metastatic disease. CONTRAST:  21mL MULTIHANCE GADOBENATE DIMEGLUMINE 529 MG/ML IV SOLN COMPARISON:  MRI of the lumbar  spine November 09, 2016 FINDINGS: MRI CERVICAL SPINE FINDINGS ALIGNMENT: Straightened cervical lordosis.  No malalignment. VERTEBRAE/DISCS: Vertebral bodies are intact. Severe C3-4, moderate C4-5, moderate C5-6, moderate to severe C6-7, severe 7 C7-T1 disc height loss with moderate multilevel chronic discogenic endplate changes. No STIR signal abnormality to suggest acute fracture. Diffusely low bone marrow signal with multiple low T1, bright STIR enhancing metastasis throughout the cervical spine. Heterogeneous calvarium consistent with metastatic disease. CORD:Subcentimeter focus of T2 bright signal within the LEFT greater than RIGHT cervical spinal cord at C4 associated with cord compression. No syrinx, no abnormal cord, leptomeningeal or epidural enhancement. POSTERIOR FOSSA, VERTEBRAL ARTERIES, PARASPINAL TISSUES: No MR findings of ligamentous injury. Vertebral artery flow voids present. Included posterior fossa and paraspinal soft tissues are nonsuspicious. DISC LEVELS: C2-3: Small broad-based disc bulge asymmetric to the RIGHT. Uncovertebral hypertrophy. Severe RIGHT, mild LEFT facet arthropathy. No canal stenosis. Severe RIGHT, mild LEFT neural foraminal narrowing. C3-4: 2 mm broad-based disc bulge, uncovertebral hypertrophy mild facet arthropathy. Severe canal stenosis, AP dimension of the canal is 6 mm. Severe bilateral neural foraminal narrowing. C4-5: Small broad-based disc bulge, uncovertebral hypertrophy and mild facet arthropathy. No canal stenosis. Severe bilateral neural foraminal narrowing. C5-6: Small broad-based disc bulge, uncovertebral hypertrophy and mild facet arthropathy. No canal stenosis. Moderate RIGHT, and moderate to severe LEFT neural foraminal narrowing. C6-7: Small broad-based disc bulge, uncovertebral hypertrophy and mild facet arthropathy. No canal stenosis. Moderate bilateral neural foraminal narrowing. C7-T1: Small broad-based disc bulge, uncovertebral hypertrophy mild facet  arthropathy. No canal stenosis. Moderate to severe bilateral neural foraminal narrowing. MRI THORACIC SPINE FINDINGS ALIGNMENT: Maintenance of the thoracic kyphosis. No malalignment. VERTEBRAE/DISCS: Diffusely low bone marrow signal with multiple low T1, bright STIR enhancing metastasis throughout the thoracic spine. Tumoral expansion of T4, T5, T6 and T7 middle column. Tumoral expansion RIGHT T7 pedicle and facet, LEFT T8 pedicle.  Tumoral invasion of the RIGHT epidural space from T5-6 2 T7-8, extending into the neural foramen, RIGHT lateral posterior pleural space and paraspinal soft tissues, RIGHT T7 rib. No fracture deformity. Metastasis in all included ribs, including multifocal tumoral expansion. CORD: Multilevel cord compression, however are no cord edema. No abnormal cord, leptomeningeal enhancement. PREVERTEBRAL AND PARASPINAL SOFT TISSUES:  Nonacute. DISC LEVELS: At T6-7 and T7 RIGHT pedicle and epidural tumor effaces the thecal sac and mildly deforms the spinal cord. Mild canal stenosis. Tumor completely effaces the RIGHT C6-7 neural foramen. Tumor mildly narrows the RIGHT T7-8 neural foramen. Lower lumbar small broad-based disc bulges and facet arthropathy. Mild-to-moderate T9-10 neural foraminal narrowing. MRI LUMBAR SPINE FINDINGS- moderately motion degraded examination. SEGMENTATION: For the purposes of this report, the last well-formed intervertebral disc will be described as L5-S1. ALIGNMENT: Maintenance of the lumbar lordosis. No malalignment. VERTEBRAE:Diffusely low bone marrow signal with multiple low T1, bright STIR enhancing metastasis throughout the lumbar spine. No pathologic fracture. Moderate to severe L5-S1 disc height loss. Multilevel mild desiccation. Moderate to severe chronic discogenic endplate changes 075-GRM, mild at the remaining lumbar levels. Expansile tumor RIGHT iliac bone invading the gluteal and paraspinal muscles. Congenital canal narrowing on the basis of foreshortened  pedicles. Epidural lipomatosis. CONUS MEDULLARIS: Conus medullaris terminates at L1-2 and demonstrates normal morphology and signal characteristics. 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 No abnormal cord, leptomeningeal enhancement. No epidural tumor. PARASPINAL AND SOFT TISSUES: Paraspinal muscle bright interstitial STIR signal and enhancement most consistent with denervation. 2 cm gallstone. DISC LEVELS: L1-2: Annular bulging. Moderate facet arthropathy and ligamentum flavum redundancy without canal stenosis or neural foraminal narrowing. L2-3: 3 mm broad-based disc bulge. Moderate to severe facet arthropathy and ligamentum flavum redundancy. Epidural lipomatosis. Moderate osseous canal stenosis and severe thecal sac effacement, 5 mm. RIGHT lateral recess stenosis may affect the traversing RIGHT L3 nerve. Mild RIGHT neural foraminal narrowing. L3-4: Small broad-based disc bulge. Moderate to severe facet arthropathy and ligamentum flavum redundancy. Trace facet effusions which are likely reactive. Moderate canal stenosis. Epidural lipomatosis. Severe effacement thecal sac narrowed to 4 mm. Mild bilateral neural foraminal narrowing. L4-5: 2 mm broad-based disc bulge. Moderate RIGHT and severe LEFT facet arthropathy and ligamentum flavum redundancy with trace facet effusions which are likely reactive. Epidural lipomatosis. Moderate to severe canal stenosis, severe effacement thecal sac to 5 mm. LEFT lateral recess stenosis likely affects the traversing LEFT L5 nerve. Mild RIGHT, moderate LEFT neural foraminal narrowing. L5-S1: 5 mm broad-based disc bulge asymmetric to the RIGHT, enhancing central annular fissure. Severe RIGHT and moderate LEFT facet arthropathy and ligamentum flavum redundancy. Epidural lipomatosis. Mild canal stenosis with partially effaced thecal sac. Moderate to severe RIGHT, mild LEFT neural foraminal narrowing. Encroachment upon the exited RIGHT L5 nerve. IMPRESSION:  MRI CERVICAL SPINE: Diffuse osseous metastasis without pathologic fracture. 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 Neural foraminal narrowing all cervical levels: Severe from C2-3 through C4-5. MRI THORACIC SPINE: Diffuse osseous metastasis without pathologic fracture. Tumoral expansion and epidural invasion at T6-7 results in mild canal stenosis and severe RIGHT T6-7 neural foraminal narrowing. Displaced thoracic spinal cord without cord compression. MRI LUMBAR SPINE: Diffuse osseous metastasis without pathologic fracture. Degenerative change and epidural lipomatosis result in severe thecal sac effacement L2-3 through L4-5. Moderate to severe canal stenosis L4-5, moderate at L2-3 and L3-4. Neural foraminal narrowing L2-3 through L5-S1: Moderate to severe on the RIGHT at L5-S1. Acute findings discussed  with and reconfirmed by Dr.Rancour on 12/19/2016 at 1:27 am. Electronically Signed   By: Elon Alas M.D.   On: 12/19/2016 01:28   Mr Lumbar Spine W Wo Contrast  Result Date: 12/19/2016 CLINICAL DATA:  Progressive back pain for 6 months, burning sensation to lower extremities for several months. History of metastatic prostate cancer. EXAM: MRI TOTAL SPINE WITHOUT AND WITH CONTRAST TECHNIQUE: Multisequence multiplanar MR imaging of the spine from the cervical spine to the sacrum was performed prior to and following IV contrast administration for evaluation of spinal metastatic disease. CONTRAST:  21mL MULTIHANCE GADOBENATE DIMEGLUMINE 529 MG/ML IV SOLN COMPARISON:  MRI of the lumbar spine November 09, 2016 FINDINGS: MRI CERVICAL SPINE FINDINGS ALIGNMENT: Straightened cervical lordosis.  No malalignment. VERTEBRAE/DISCS: Vertebral bodies are intact. Severe C3-4, moderate C4-5, moderate C5-6, moderate to severe C6-7, severe 7 C7-T1 disc height loss with moderate multilevel chronic discogenic endplate changes. No STIR signal abnormality to suggest  acute fracture. Diffusely low bone marrow signal with multiple low T1, bright STIR enhancing metastasis throughout the cervical spine. Heterogeneous calvarium consistent with metastatic disease. CORD:Subcentimeter focus of T2 bright signal within the LEFT greater than RIGHT cervical spinal cord at C4 associated with cord compression. No syrinx, no abnormal cord, leptomeningeal or epidural enhancement. POSTERIOR FOSSA, VERTEBRAL ARTERIES, PARASPINAL TISSUES: No MR findings of ligamentous injury. Vertebral artery flow voids present. Included posterior fossa and paraspinal soft tissues are nonsuspicious. DISC LEVELS: C2-3: Small broad-based disc bulge asymmetric to the RIGHT. Uncovertebral hypertrophy. Severe RIGHT, mild LEFT facet arthropathy. No canal stenosis. Severe RIGHT, mild LEFT neural foraminal narrowing. C3-4: 2 mm broad-based disc bulge, uncovertebral hypertrophy mild facet arthropathy. Severe canal stenosis, AP dimension of the canal is 6 mm. Severe bilateral neural foraminal narrowing. C4-5: Small broad-based disc bulge, uncovertebral hypertrophy and mild facet arthropathy. No canal stenosis. Severe bilateral neural foraminal narrowing. C5-6: Small broad-based disc bulge, uncovertebral hypertrophy and mild facet arthropathy. No canal stenosis. Moderate RIGHT, and moderate to severe LEFT neural foraminal narrowing. C6-7: Small broad-based disc bulge, uncovertebral hypertrophy and mild facet arthropathy. No canal stenosis. Moderate bilateral neural foraminal narrowing. C7-T1: Small broad-based disc bulge, uncovertebral hypertrophy mild facet arthropathy. No canal stenosis. Moderate to severe bilateral neural foraminal narrowing. MRI THORACIC SPINE FINDINGS ALIGNMENT: Maintenance of the thoracic kyphosis. No malalignment. VERTEBRAE/DISCS: Diffusely low bone marrow signal with multiple low T1, bright STIR enhancing metastasis throughout the thoracic spine. Tumoral expansion of T4, T5, T6 and T7 middle column.  Tumoral expansion RIGHT T7 pedicle and facet, LEFT T8 pedicle. Tumoral invasion of the RIGHT epidural space from T5-6 2 T7-8, extending into the neural foramen, RIGHT lateral posterior pleural space and paraspinal soft tissues, RIGHT T7 rib. No fracture deformity. Metastasis in all included ribs, including multifocal tumoral expansion. CORD: Multilevel cord compression, however are no cord edema. No abnormal cord, leptomeningeal enhancement. PREVERTEBRAL AND PARASPINAL SOFT TISSUES:  Nonacute. DISC LEVELS: At T6-7 and T7 RIGHT pedicle and epidural tumor effaces the thecal sac and mildly deforms the spinal cord. Mild canal stenosis. Tumor completely effaces the RIGHT C6-7 neural foramen. Tumor mildly narrows the RIGHT T7-8 neural foramen. Lower lumbar small broad-based disc bulges and facet arthropathy. Mild-to-moderate T9-10 neural foraminal narrowing. MRI LUMBAR SPINE FINDINGS- moderately motion degraded examination. SEGMENTATION: For the purposes of this report, the last well-formed intervertebral disc will be described as L5-S1. ALIGNMENT: Maintenance of the lumbar lordosis. No malalignment. VERTEBRAE:Diffusely low bone marrow signal with multiple low T1, bright STIR enhancing metastasis throughout the lumbar spine. No pathologic  fracture. Moderate to severe L5-S1 disc height loss. Multilevel mild desiccation. Moderate to severe chronic discogenic endplate changes [id number]-GRM, mild at the remaining lumbar levels. Expansile tumor RIGHT iliac bone invading the gluteal and paraspinal muscles. Congenital canal narrowing on the basis of foreshortened pedicles. Epidural lipomatosis. CONUS MEDULLARIS: Conus medullaris terminates at L1-2 and demonstrates normal morphology and signal characteristics. 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 No abnormal cord, leptomeningeal enhancement. No epidural tumor. PARASPINAL AND SOFT TISSUES: Paraspinal muscle bright interstitial STIR signal and enhancement most  consistent with denervation. 2 cm gallstone. DISC LEVELS: L1-2: Annular bulging. Moderate facet arthropathy and ligamentum flavum redundancy without canal stenosis or neural foraminal narrowing. L2-3: 3 mm broad-based disc bulge. Moderate to severe facet arthropathy and ligamentum flavum redundancy. Epidural lipomatosis. Moderate osseous canal stenosis and severe thecal sac effacement, 5 mm. RIGHT lateral recess stenosis may affect the traversing RIGHT L3 nerve. Mild RIGHT neural foraminal narrowing. L3-4: Small broad-based disc bulge. Moderate to severe facet arthropathy and ligamentum flavum redundancy. Trace facet effusions which are likely reactive. Moderate canal stenosis. Epidural lipomatosis. Severe effacement thecal sac narrowed to 4 mm. Mild bilateral neural foraminal narrowing. L4-5: 2 mm broad-based disc bulge. Moderate RIGHT and severe LEFT facet arthropathy and ligamentum flavum redundancy with trace facet effusions which are likely reactive. Epidural lipomatosis. Moderate to severe canal stenosis, severe effacement thecal sac to 5 mm. LEFT lateral recess stenosis likely affects the traversing LEFT L5 nerve. Mild RIGHT, moderate LEFT neural foraminal narrowing. L5-S1: 5 mm broad-based disc bulge asymmetric to the RIGHT, enhancing central annular fissure. Severe RIGHT and moderate LEFT facet arthropathy and ligamentum flavum redundancy. Epidural lipomatosis. Mild canal stenosis with partially effaced thecal sac. Moderate to severe RIGHT, mild LEFT neural foraminal narrowing. Encroachment upon the exited RIGHT L5 nerve. 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 Neural foraminal narrowing all cervical levels: Severe from C2-3 through C4-5. MRI THORACIC SPINE: Diffuse osseous metastasis without pathologic fracture. Tumoral expansion and epidural invasion at  T6-7 results in mild canal stenosis and severe RIGHT T6-7 neural foraminal narrowing. Displaced thoracic spinal cord without cord compression. MRI LUMBAR SPINE: Diffuse osseous metastasis without pathologic fracture. Degenerative change and epidural lipomatosis result in severe thecal sac effacement L2-3 through L4-5. Moderate to severe canal stenosis L4-5, moderate at L2-3 and L3-4. Neural foraminal narrowing L2-3 through L5-S1: Moderate to severe on the RIGHT at L5-S1. Acute findings discussed with and reconfirmed by Dr.Rancour on 12/19/2016 at 1:27 am. Electronically Signed   By: Elon Alas M.D.   On: 12/19/2016 01:28     Subjective:  Discharge Exam: Vitals:   12/20/16 0301 12/20/16 0609 12/20/16 2135 12/21/16 0551  BP: 130/87 123/72 114/60 123/63  Pulse: 81 78 72 82  Resp:   18   Temp: 98.2 F (36.8 C) 98.1 F (36.7 C) 97.1 F (36.2 C) 98.2 F (36.8 C)  TempSrc: Oral Oral Oral Oral  SpO2: 100% 100% 100% 94%  Height:       General: Pt is alert, awake, not in acute distress Cardiovascular: RRR, S1/S2 +, no rubs, no gallops Respiratory: CTA bilaterally, no wheezing, no rhonchi Abdominal: Soft, NT, ND, bowel sounds + Extremities: no edema, no cyanosis   The results of significant diagnostics from this hospitalization (including imaging, microbiology, ancillary and laboratory) are listed below for reference.    Microbiology: Recent Results (from the past 240 hour(s))  Urine culture     Status: None   Collection Time: 12/19/16  8:25 AM  Result Value Ref Range Status   Specimen Description URINE, RANDOM  Final   Special Requests NONE  Final   Culture NO GROWTH  Final   Report Status 12/21/2016 FINAL  Final     Labs: BNP (last 3 results) No results for input(s): BNP in the last 8760 hours. Basic Metabolic Panel:  Recent Labs Lab 12/18/16 2111 12/20/16 1000 12/21/16 0332  NA 134* 139 140  K 3.6 4.7 4.1  CL 99* 102 102  CO2 23 28 27   GLUCOSE 100* 132* 141*  BUN  7 16 19   CREATININE 0.45* 0.52* 0.50*  CALCIUM 8.1* 8.5* 8.8*   Liver Function Tests:  Recent Labs Lab 12/19/16 0923  AST 92*  ALT 35  ALKPHOS 269*  BILITOT 0.7  PROT 6.5  ALBUMIN 1.9*   No results for input(s): LIPASE, AMYLASE in the last 168 hours. No results for input(s): AMMONIA in the last 168 hours. CBC:  Recent Labs Lab 12/18/16 2111 12/20/16 1000 12/21/16 0332  WBC 8.6 13.1* 13.6*  NEUTROABS 4.8  --   --   HGB 6.8* 10.3* 10.5*  HCT 21.6* 30.8* 32.0*  MCV 83.1 82.1 84.0  PLT 103* 104* 114*   Cardiac Enzymes: No results for input(s): CKTOTAL, CKMB, CKMBINDEX, TROPONINI in the last 168 hours. BNP: Invalid input(s): POCBNP CBG: No results for input(s): GLUCAP in the last 168 hours. D-Dimer  Recent Labs  12/19/16 0923  DDIMER >20.00*   Hgb A1c No results for input(s): HGBA1C in the last 72 hours. Lipid Profile No results for input(s): CHOL, HDL, LDLCALC, TRIG, CHOLHDL, LDLDIRECT in the last 72 hours. Thyroid function studies No results for input(s): TSH, T4TOTAL, T3FREE, THYROIDAB in the last 72 hours.  Invalid input(s): FREET3 Anemia work up  Recent Labs  12/19/16 0050  VITAMINB12 499  FOLATE 10.4  FERRITIN 3,578*  TIBC 176*  IRON 52  RETICCTPCT 3.9*   Urinalysis    Component Value Date/Time   COLORURINE YELLOW 12/19/2016 0825   APPEARANCEUR CLEAR 12/19/2016 0825   LABSPEC 1.021 12/19/2016 0825   PHURINE 7.0 12/19/2016 0825   GLUCOSEU NEGATIVE 12/19/2016 0825   HGBUR NEGATIVE 12/19/2016 0825   BILIRUBINUR NEGATIVE 12/19/2016 0825   KETONESUR NEGATIVE 12/19/2016 0825   PROTEINUR NEGATIVE 12/19/2016 0825   NITRITE NEGATIVE 12/19/2016 0825   LEUKOCYTESUR NEGATIVE 12/19/2016 0825   Sepsis Labs Invalid input(s): PROCALCITONIN,  WBC,  LACTICIDVEN Microbiology Recent Results (from the past 240 hour(s))  Urine culture     Status: None   Collection Time: 12/19/16  8:25 AM  Result Value Ref Range Status   Specimen Description URINE,  RANDOM  Final   Special Requests NONE  Final   Culture NO GROWTH  Final   Report Status 12/21/2016 FINAL  Final     Time coordinating discharge: Over 30 minutes  SIGNED:   Birdie Hopes, MD  [facility] 12/21/2016, 10:15 AM Pager   If 7PM-7AM, please contact night-coverage [email or web address] Password [email or web address]

## 2017-01-01 ENCOUNTER — Encounter: Payer: Self-pay | Admitting: Radiation Oncology

## 2017-01-02 ENCOUNTER — Other Ambulatory Visit: Payer: Self-pay | Admitting: Radiation Oncology

## 2017-01-02 ENCOUNTER — Ambulatory Visit
Admission: RE | Admit: 2017-01-02 | Discharge: 2017-01-02 | Disposition: A | Discharge status: Home / Self Care | Payer: BLUE CROSS/BLUE SHIELD | Source: Ambulatory Visit | Attending: Radiation Oncology | Admitting: Radiation Oncology

## 2017-01-02 ENCOUNTER — Encounter: Payer: Self-pay | Admitting: Radiation Oncology

## 2017-01-02 DIAGNOSIS — C61 Malignant neoplasm of prostate: Secondary | ICD-10-CM

## 2017-01-02 NOTE — Progress Notes (Signed)
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 Last lupron injection 22.5 mg IM received 10/02/16  Pain on a scale of 0-10 is: reports low back pain that radiates up his spine and to either shoulder 7 on a scale of 0-10. Reports taking oxycodone 10 mg every six hours for pain.   If Spine Met(s), symptoms, if any, include:  Bowel/Bladder retention or incontinence (please describe): No  Numbness or weakness in extremities (please describe): Bilateral lower extremity weakness. Numbness down right leg.   Current Decadron regimen, if applicable: given dexamethasone at [facility] ED. On taper at dex 1 mg once a day. No evidence of thrush noted.  Ambulatory status? Walker? Wheelchair?: Ambulatory with aid of cane or walker.   SAFETY ISSUES:  Prior radiation? No  Pacemaker/ICD? no  Possible current pregnancy? no  Is the patient on methotrexate? no  Current Complaints / other details:  66 year old male. Accompanied by his brother, Ryan Ware. Patient is weak. No confusion noted today as compared to [place] report. Patient reports rectal bleeding noted by [facility] has stopped. No cord compression seen on 12/2016 MRI. Reports his healthy weight nine months ago was 225 lb. Patient weighs 159 lb today. Reports his appetite is improving with steroids.Reports several falls in the last several months.

## 2017-01-05 ENCOUNTER — Encounter: Payer: Self-pay | Admitting: Radiation Oncology

## 2017-01-05 ENCOUNTER — Ambulatory Visit
Admission: RE | Admit: 2017-01-05 | Discharge: 2017-01-05 | Disposition: A | Discharge status: Home / Self Care | Payer: BLUE CROSS/BLUE SHIELD | Source: Ambulatory Visit | Attending: Radiation Oncology | Admitting: Radiation Oncology

## 2017-01-05 ENCOUNTER — Encounter: Payer: Self-pay | Admitting: Medical Oncology

## 2017-01-05 VITALS — BP 114/69 | HR 101 | Temp 98.2°F | Resp 18 | Ht 77.0 in | Wt 159.0 lb

## 2017-01-05 DIAGNOSIS — M5126 Other intervertebral disc displacement, lumbar region: Secondary | ICD-10-CM | POA: Insufficient documentation

## 2017-01-05 DIAGNOSIS — Z510 Encounter for antineoplastic radiation therapy: Secondary | ICD-10-CM | POA: Diagnosis present

## 2017-01-05 DIAGNOSIS — G893 Neoplasm related pain (acute) (chronic): Secondary | ICD-10-CM | POA: Insufficient documentation

## 2017-01-05 DIAGNOSIS — C61 Malignant neoplasm of prostate: Secondary | ICD-10-CM | POA: Insufficient documentation

## 2017-01-05 DIAGNOSIS — M50223 Other cervical disc displacement at C6-C7 level: Secondary | ICD-10-CM | POA: Diagnosis not present

## 2017-01-05 DIAGNOSIS — C7951 Secondary malignant neoplasm of bone: Secondary | ICD-10-CM | POA: Diagnosis present

## 2017-01-05 DIAGNOSIS — M48061 Spinal stenosis, lumbar region without neurogenic claudication: Secondary | ICD-10-CM | POA: Insufficient documentation

## 2017-01-05 DIAGNOSIS — F17210 Nicotine dependence, cigarettes, uncomplicated: Secondary | ICD-10-CM | POA: Diagnosis not present

## 2017-01-05 DIAGNOSIS — B1920 Unspecified viral hepatitis C without hepatic coma: Secondary | ICD-10-CM | POA: Diagnosis not present

## 2017-01-05 DIAGNOSIS — C7952 Secondary malignant neoplasm of bone marrow: Secondary | ICD-10-CM | POA: Diagnosis not present

## 2017-01-05 DIAGNOSIS — M4802 Spinal stenosis, cervical region: Secondary | ICD-10-CM | POA: Insufficient documentation

## 2017-01-05 HISTORY — DX: Malignant neoplasm of bone and articular cartilage, unspecified: C41.9

## 2017-01-05 HISTORY — DX: Malignant neoplasm of prostate: C61

## 2017-01-05 NOTE — Progress Notes (Signed)
[place]         [phone] ________________________________  Initial outpatient Consultation  Name: Ryan Ware MRN: [id number]  Date: 01/05/2017  DOB: Aug 02, 1951  REFERRING PHYSICIAN: [name], DO  DIAGNOSIS: The primary encounter diagnosis was Secondary malignant neoplasm of bone and bone marrow ([place]). A diagnosis of Prostate cancer metastatic to bone [facility]) was also pertinent to this visit.    ICD-9-CM ICD-10-CM   1. Secondary malignant neoplasm of bone and bone marrow ([facility]) 198.5 C79.51     C79.52   2. Prostate cancer metastatic to bone ([place]) 185 C61    198.5 C79.51     HISTORY OF PRESENT ILLNESS::Ryan Ware is a 66 y.o. 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 His PSA in March 2017 was 1200 with evidence of metastasis throughout the skeleton. Patient began Lupron in July 2017 when his PSA was 1700. Patient received Mills Koller, Lupron, and Oliver. His last Lupron injection of 22.5 mg IM was received on 10/02/16. Lumbar MRI on 11/09/16 showed metastasis to the spine.  In February, the patient reported bilateral leg weakness. MRI of the C, T, and L spine on 12/19/16 showed diffuse osseous disease without any cord compression.  A large tumor deposit was seen at T6-T7 with neural foramen involvment:    Patient reports 7/10 lower back pain that radiates up his spine to either shoulder. He reports taking Oxycodone 10 mg every 6 hours for pain. Patient denies any bowel/bladder retention or incontinence. He notes bilateral lower extremity weakness as well as numbness down the right leg. He reports rectal bleeding has stopped. He reports a 65 lb weight loss in the last nine months. He notes improved appetite with steroids.  Patient was given Dexamethasone at [facility] ED. He is on taper at dex 1 mg once a day. No evidence of thrush is noted.  PREVIOUS RADIATION THERAPY: No  Past Medical  History:  Diagnosis Date  . Bone cancer ([place])    prostate ca with spine mets  . Hepatitis C   . Prostate cancer [facility])   :   Past Surgical History:  Procedure Laterality Date  . HAND SURGERY    :   Current Outpatient Prescriptions:  .  acetaminophen (TYLENOL) 325 MG tablet, Take 650 mg by mouth every 6 (six) hours as needed., Disp: , Rfl:  .  degarelix (FIRMAGON) 120 MG injection, Inject 240 mg into the skin once., Disp: , Rfl:  .  denosumab (PROLIA) 60 MG/ML SOLN injection, Inject 60 mg into the skin every 6 (six) months. Administer in upper arm, thigh, or abdomen, Disp: , Rfl:  .  docusate sodium (COLACE) 100 MG capsule, Take 100 mg by mouth 2 (two) times daily., Disp: , Rfl:  .  enzalutamide (XTANDI) 40 MG capsule, Take 160 mg by mouth daily., Disp: , Rfl:  .  omeprazole (PRILOSEC) 20 MG capsule, Take 20 mg by mouth daily., Disp: , Rfl:  .  Oxycodone HCl 10 MG TABS, Take 10 mg by mouth every 6 (six) hours., Disp: , Rfl:  .  dexamethasone (DECADRON) 1 MG tablet, Take 4 tabs for 2 days, then take 3 tabs for 2 days, then take 2 tabs for 2 days, then take 1 tab for 2 days, then stop., Disp: 21 tablet, Rfl: 0 .  oxyCODONE-acetaminophen (PERCOCET) 5-325 MG tablet, Take 1-2 tablets by mouth every 4 (four) hours as needed. (Patient taking differently: Take 2  tablets by mouth every 4 (four) hours as needed. ), Disp: 20 tablet, Rfl: 0 .  polyethylene glycol (MIRALAX / GLYCOLAX) packet, Take 17 g by mouth daily as needed., Disp: , Rfl: :   Allergies  Allergen Reactions  . Acetaminophen Other (See Comments)    Liver problems  :   Family History  Problem Relation Age of Onset  . Hypertension Mother   . Osteoarthritis Father   :   Social History   Social History  . Marital status: Single    Spouse name: N/A  . Number of children: N/A  . Years of education: N/A   Occupational History  . Not on file.   Social History Main Topics  . Smoking status: Current Every Day Smoker     Packs/day: 0.50    Types: Cigarettes  . Smokeless tobacco: Never Used  . Alcohol use Yes     Comment: occasional  . Drug use: No     Comment: occasional  . Sexual activity: No   Other Topics Concern  . Not on file   Social History Narrative  . No narrative on file  :  REVIEW OF SYSTEMS:  A 15 point review of systems is documented in the electronic medical record. This was obtained by the nursing staff. However, I reviewed this with the patient to discuss relevant findings and make appropriate changes.  A comprehensive review of systems was negative., except that mentioned in the HPI.   PHYSICAL EXAM:  Blood pressure 114/69, pulse (!) 101, temperature 98.2 F (36.8 C), temperature source Oral, resp. rate 18, height 6\' 5"  (1.956 m), weight 159 lb (72.1 kg), SpO2 100 %. Patient presents in a wheelchair, NAD, Thin appearance.  Good spirits. Resp nl, No pain on MSK exam, but, points to right infrascapular pain and lumbar pain as well as leg radiculopathy. Speech quiet but fluent and articulate MS 5/5 in BLE   KPS = 50  100 - Normal; no complaints; no evidence of disease. 90   - Able to carry on normal activity; minor signs or symptoms of disease. 80   - Normal activity with effort; some signs or symptoms of disease. 79   - Cares for self; unable to carry on normal activity or to do active work. 60   - Requires occasional assistance, but is able to care for most of his personal needs. 50   - Requires considerable assistance and frequent medical care. 74   - Disabled; requires special care and assistance. 28   - Severely disabled; hospital admission is indicated although death not imminent. 62   - Very sick; hospital admission necessary; active supportive treatment necessary. 10   - Moribund; fatal processes progressing rapidly. 0     - Dead  Karnofsky DA, Abelmann [place], Craver LS and Burchenal [facility] [phone]) The use of the nitrogen mustards in the palliative treatment of carcinoma: with  particular reference to bronchogenic carcinoma Cancer 1 [id number]  LABORATORY DATA:  Lab Results  Component Value Date   WBC 13.6 (H) 12/21/2016   HGB 10.5 (L) 12/21/2016   HCT 32.0 (L) 12/21/2016   MCV 84.0 12/21/2016   PLT 114 (L) 12/21/2016   Lab Results  Component Value Date   NA 140 12/21/2016   K 4.1 12/21/2016   CL 102 12/21/2016   CO2 27 12/21/2016   Lab Results  Component Value Date   ALT 35 12/19/2016   AST 92 (H) 12/19/2016   ALKPHOS 269 (H) 12/19/2016  BILITOT 0.7 12/19/2016     RADIOGRAPHY: Mr Cervical Spine W Wo Contrast  Result Date: 12/19/2016 CLINICAL DATA:  Progressive back pain for 6 months, burning sensation to lower extremities for several months. History of metastatic prostate cancer. EXAM: MRI TOTAL SPINE WITHOUT AND WITH CONTRAST TECHNIQUE: Multisequence multiplanar MR imaging of the spine from the cervical spine to the sacrum was performed prior to and following IV contrast administration for evaluation of spinal metastatic disease. CONTRAST:  73mL MULTIHANCE GADOBENATE DIMEGLUMINE 529 MG/ML IV SOLN COMPARISON:  MRI of the lumbar spine November 09, 2016 FINDINGS: MRI CERVICAL SPINE FINDINGS ALIGNMENT: Straightened cervical lordosis.  No malalignment. VERTEBRAE/DISCS: Vertebral bodies are intact. Severe C3-4, moderate C4-5, moderate C5-6, moderate to severe C6-7, severe 7 C7-T1 disc height loss with moderate multilevel chronic discogenic endplate changes. No STIR signal abnormality to suggest acute fracture. Diffusely low bone marrow signal with multiple low T1, bright STIR enhancing metastasis throughout the cervical spine. Heterogeneous calvarium consistent with metastatic disease. CORD:Subcentimeter focus of T2 bright signal within the LEFT greater than RIGHT cervical spinal cord at C4 associated with cord compression. No syrinx, no abnormal cord, leptomeningeal or epidural enhancement. POSTERIOR FOSSA, VERTEBRAL ARTERIES, PARASPINAL TISSUES: No MR findings of  ligamentous injury. Vertebral artery flow voids present. Included posterior fossa and paraspinal soft tissues are nonsuspicious. DISC LEVELS: C2-3: Small broad-based disc bulge asymmetric to the RIGHT. Uncovertebral hypertrophy. Severe RIGHT, mild LEFT facet arthropathy. No canal stenosis. Severe RIGHT, mild LEFT neural foraminal narrowing. C3-4: 2 mm broad-based disc bulge, uncovertebral hypertrophy mild facet arthropathy. Severe canal stenosis, AP dimension of the canal is 6 mm. Severe bilateral neural foraminal narrowing. C4-5: Small broad-based disc bulge, uncovertebral hypertrophy and mild facet arthropathy. No canal stenosis. Severe bilateral neural foraminal narrowing. C5-6: Small broad-based disc bulge, uncovertebral hypertrophy and mild facet arthropathy. No canal stenosis. Moderate RIGHT, and moderate to severe LEFT neural foraminal narrowing. C6-7: Small broad-based disc bulge, uncovertebral hypertrophy and mild facet arthropathy. No canal stenosis. Moderate bilateral neural foraminal narrowing. C7-T1: Small broad-based disc bulge, uncovertebral hypertrophy mild facet arthropathy. No canal stenosis. Moderate to severe bilateral neural foraminal narrowing. MRI THORACIC SPINE FINDINGS ALIGNMENT: Maintenance of the thoracic kyphosis. No malalignment. VERTEBRAE/DISCS: Diffusely low bone marrow signal with multiple low T1, bright STIR enhancing metastasis throughout the thoracic spine. Tumoral expansion of T4, T5, T6 and T7 middle column. Tumoral expansion RIGHT T7 pedicle and facet, LEFT T8 pedicle. Tumoral invasion of the RIGHT epidural space from T5-6 2 T7-8, extending into the neural foramen, RIGHT lateral posterior pleural space and paraspinal soft tissues, RIGHT T7 rib. No fracture deformity. Metastasis in all included ribs, including multifocal tumoral expansion. CORD: Multilevel cord compression, however are no cord edema. No abnormal cord, leptomeningeal enhancement. PREVERTEBRAL AND PARASPINAL SOFT  TISSUES:  Nonacute. DISC LEVELS: At T6-7 and T7 RIGHT pedicle and epidural tumor effaces the thecal sac and mildly deforms the spinal cord. Mild canal stenosis. Tumor completely effaces the RIGHT C6-7 neural foramen. Tumor mildly narrows the RIGHT T7-8 neural foramen. Lower lumbar small broad-based disc bulges and facet arthropathy. Mild-to-moderate T9-10 neural foraminal narrowing. MRI LUMBAR SPINE FINDINGS- moderately motion degraded examination. SEGMENTATION: For the purposes of this report, the last well-formed intervertebral disc will be described as L5-S1. ALIGNMENT: Maintenance of the lumbar lordosis. No malalignment. VERTEBRAE:Diffusely low bone marrow signal with multiple low T1, bright STIR enhancing metastasis throughout the lumbar spine. No pathologic fracture. Moderate to severe L5-S1 disc height loss. Multilevel mild desiccation. Moderate to severe chronic discogenic endplate changes  L5-S1, mild at the remaining lumbar levels. Expansile tumor RIGHT iliac bone invading the gluteal and paraspinal muscles. Congenital canal narrowing on the basis of foreshortened pedicles. Epidural lipomatosis. CONUS MEDULLARIS: Conus medullaris terminates at L1-2 and demonstrates normal morphology and signal characteristics. 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 No abnormal cord, leptomeningeal enhancement. No epidural tumor. PARASPINAL AND SOFT TISSUES: Paraspinal muscle bright interstitial STIR signal and enhancement most consistent with denervation. 2 cm gallstone. DISC LEVELS: L1-2: Annular bulging. Moderate facet arthropathy and ligamentum flavum redundancy without canal stenosis or neural foraminal narrowing. L2-3: 3 mm broad-based disc bulge. Moderate to severe facet arthropathy and ligamentum flavum redundancy. Epidural lipomatosis. Moderate osseous canal stenosis and severe thecal sac effacement, 5 mm. RIGHT lateral recess stenosis may affect the traversing RIGHT L3 nerve. Mild RIGHT  neural foraminal narrowing. L3-4: Small broad-based disc bulge. Moderate to severe facet arthropathy and ligamentum flavum redundancy. Trace facet effusions which are likely reactive. Moderate canal stenosis. Epidural lipomatosis. Severe effacement thecal sac narrowed to 4 mm. Mild bilateral neural foraminal narrowing. L4-5: 2 mm broad-based disc bulge. Moderate RIGHT and severe LEFT facet arthropathy and ligamentum flavum redundancy with trace facet effusions which are likely reactive. Epidural lipomatosis. Moderate to severe canal stenosis, severe effacement thecal sac to 5 mm. LEFT lateral recess stenosis likely affects the traversing LEFT L5 nerve. Mild RIGHT, moderate LEFT neural foraminal narrowing. L5-S1: 5 mm broad-based disc bulge asymmetric to the RIGHT, enhancing central annular fissure. Severe RIGHT and moderate LEFT facet arthropathy and ligamentum flavum redundancy. Epidural lipomatosis. Mild canal stenosis with partially effaced thecal sac. Moderate to severe RIGHT, mild LEFT neural foraminal narrowing. Encroachment upon the exited RIGHT L5 nerve. 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 Neural foraminal narrowing all cervical levels: Severe from C2-3 through C4-5. MRI THORACIC SPINE: Diffuse osseous metastasis without pathologic fracture. Tumoral expansion and epidural invasion at T6-7 results in mild canal stenosis and severe RIGHT T6-7 neural foraminal narrowing. Displaced thoracic spinal cord without cord compression. MRI LUMBAR SPINE: Diffuse osseous metastasis without pathologic fracture. Degenerative change and epidural lipomatosis result in severe thecal sac effacement L2-3 through L4-5. Moderate to severe canal stenosis L4-5, moderate at L2-3 and L3-4. Neural foraminal narrowing L2-3 through L5-S1: Moderate to severe on the RIGHT at L5-S1. Acute  findings discussed with and reconfirmed by Dr.Rancour on 12/19/2016 at 1:27 am. Electronically Signed   By: Elon Alas M.D.   On: 12/19/2016 01:28   Mr Thoracic Spine W Wo Contrast  Result Date: 12/19/2016 CLINICAL DATA:  Progressive back pain for 6 months, burning sensation to lower extremities for several months. History of metastatic prostate cancer. EXAM: MRI TOTAL SPINE WITHOUT AND WITH CONTRAST TECHNIQUE: Multisequence multiplanar MR imaging of the spine from the cervical spine to the sacrum was performed prior to and following IV contrast administration for evaluation of spinal metastatic disease. CONTRAST:  36mL MULTIHANCE GADOBENATE DIMEGLUMINE 529 MG/ML IV SOLN COMPARISON:  MRI of the lumbar spine November 09, 2016 FINDINGS: MRI CERVICAL SPINE FINDINGS ALIGNMENT: Straightened cervical lordosis.  No malalignment. VERTEBRAE/DISCS: Vertebral bodies are intact. Severe C3-4, moderate C4-5, moderate C5-6, moderate to severe C6-7, severe 7 C7-T1 disc height loss with moderate multilevel chronic discogenic endplate changes. No STIR signal abnormality to suggest acute fracture. Diffusely low bone marrow signal with multiple low T1, bright STIR enhancing metastasis throughout the cervical spine. Heterogeneous calvarium consistent with  metastatic disease. CORD:Subcentimeter focus of T2 bright signal within the LEFT greater than RIGHT cervical spinal cord at C4 associated with cord compression. No syrinx, no abnormal cord, leptomeningeal or epidural enhancement. POSTERIOR FOSSA, VERTEBRAL ARTERIES, PARASPINAL TISSUES: No MR findings of ligamentous injury. Vertebral artery flow voids present. Included posterior fossa and paraspinal soft tissues are nonsuspicious. DISC LEVELS: C2-3: Small broad-based disc bulge asymmetric to the RIGHT. Uncovertebral hypertrophy. Severe RIGHT, mild LEFT facet arthropathy. No canal stenosis. Severe RIGHT, mild LEFT neural foraminal narrowing. C3-4: 2 mm broad-based disc bulge,  uncovertebral hypertrophy mild facet arthropathy. Severe canal stenosis, AP dimension of the canal is 6 mm. Severe bilateral neural foraminal narrowing. C4-5: Small broad-based disc bulge, uncovertebral hypertrophy and mild facet arthropathy. No canal stenosis. Severe bilateral neural foraminal narrowing. C5-6: Small broad-based disc bulge, uncovertebral hypertrophy and mild facet arthropathy. No canal stenosis. Moderate RIGHT, and moderate to severe LEFT neural foraminal narrowing. C6-7: Small broad-based disc bulge, uncovertebral hypertrophy and mild facet arthropathy. No canal stenosis. Moderate bilateral neural foraminal narrowing. C7-T1: Small broad-based disc bulge, uncovertebral hypertrophy mild facet arthropathy. No canal stenosis. Moderate to severe bilateral neural foraminal narrowing. MRI THORACIC SPINE FINDINGS ALIGNMENT: Maintenance of the thoracic kyphosis. No malalignment. VERTEBRAE/DISCS: Diffusely low bone marrow signal with multiple low T1, bright STIR enhancing metastasis throughout the thoracic spine. Tumoral expansion of T4, T5, T6 and T7 middle column. Tumoral expansion RIGHT T7 pedicle and facet, LEFT T8 pedicle. Tumoral invasion of the RIGHT epidural space from T5-6 2 T7-8, extending into the neural foramen, RIGHT lateral posterior pleural space and paraspinal soft tissues, RIGHT T7 rib. No fracture deformity. Metastasis in all included ribs, including multifocal tumoral expansion. CORD: Multilevel cord compression, however are no cord edema. No abnormal cord, leptomeningeal enhancement. PREVERTEBRAL AND PARASPINAL SOFT TISSUES:  Nonacute. DISC LEVELS: At T6-7 and T7 RIGHT pedicle and epidural tumor effaces the thecal sac and mildly deforms the spinal cord. Mild canal stenosis. Tumor completely effaces the RIGHT C6-7 neural foramen. Tumor mildly narrows the RIGHT T7-8 neural foramen. Lower lumbar small broad-based disc bulges and facet arthropathy. Mild-to-moderate T9-10 neural foraminal  narrowing. MRI LUMBAR SPINE FINDINGS- moderately motion degraded examination. SEGMENTATION: For the purposes of this report, the last well-formed intervertebral disc will be described as L5-S1. ALIGNMENT: Maintenance of the lumbar lordosis. No malalignment. VERTEBRAE:Diffusely low bone marrow signal with multiple low T1, bright STIR enhancing metastasis throughout the lumbar spine. No pathologic fracture. Moderate to severe L5-S1 disc height loss. Multilevel mild desiccation. Moderate to severe chronic discogenic endplate changes [id number], mild at the remaining lumbar levels. Expansile tumor RIGHT iliac bone invading the gluteal and paraspinal muscles. Congenital canal narrowing on the basis of foreshortened pedicles. Epidural lipomatosis. CONUS MEDULLARIS: Conus medullaris terminates at L1-2 and demonstrates normal morphology and signal characteristics. 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 No abnormal cord, leptomeningeal enhancement. No epidural tumor. PARASPINAL AND SOFT TISSUES: Paraspinal muscle bright interstitial STIR signal and enhancement most consistent with denervation. 2 cm gallstone. DISC LEVELS: L1-2: Annular bulging. Moderate facet arthropathy and ligamentum flavum redundancy without canal stenosis or neural foraminal narrowing. L2-3: 3 mm broad-based disc bulge. Moderate to severe facet arthropathy and ligamentum flavum redundancy. Epidural lipomatosis. Moderate osseous canal stenosis and severe thecal sac effacement, 5 mm. RIGHT lateral recess stenosis may affect the traversing RIGHT L3 nerve. Mild RIGHT neural foraminal narrowing. L3-4: Small broad-based disc bulge. Moderate to severe facet arthropathy and ligamentum flavum redundancy. Trace facet effusions which are likely reactive. Moderate canal stenosis.  Epidural lipomatosis. Severe effacement thecal sac narrowed to 4 mm. Mild bilateral neural foraminal narrowing. L4-5: 2 mm broad-based disc bulge. Moderate RIGHT and  severe LEFT facet arthropathy and ligamentum flavum redundancy with trace facet effusions which are likely reactive. Epidural lipomatosis. Moderate to severe canal stenosis, severe effacement thecal sac to 5 mm. LEFT lateral recess stenosis likely affects the traversing LEFT L5 nerve. Mild RIGHT, moderate LEFT neural foraminal narrowing. L5-S1: 5 mm broad-based disc bulge asymmetric to the RIGHT, enhancing central annular fissure. Severe RIGHT and moderate LEFT facet arthropathy and ligamentum flavum redundancy. Epidural lipomatosis. Mild canal stenosis with partially effaced thecal sac. Moderate to severe RIGHT, mild LEFT neural foraminal narrowing. Encroachment upon the exited RIGHT L5 nerve. 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 Neural foraminal narrowing all cervical levels: Severe from C2-3 through C4-5. MRI THORACIC SPINE: Diffuse osseous metastasis without pathologic fracture. Tumoral expansion and epidural invasion at T6-7 results in mild canal stenosis and severe RIGHT T6-7 neural foraminal narrowing. Displaced thoracic spinal cord without cord compression. MRI LUMBAR SPINE: Diffuse osseous metastasis without pathologic fracture. Degenerative change and epidural lipomatosis result in severe thecal sac effacement L2-3 through L4-5. Moderate to severe canal stenosis L4-5, moderate at L2-3 and L3-4. Neural foraminal narrowing L2-3 through L5-S1: Moderate to severe on the RIGHT at L5-S1. Acute findings discussed with and reconfirmed by Dr.Rancour on 12/19/2016 at 1:27 am. Electronically Signed   By: Elon Alas M.D.   On: 12/19/2016 01:28   Mr Lumbar Spine W Wo Contrast  Result Date: 12/19/2016 CLINICAL DATA:  Progressive back pain for 6 months, burning sensation to lower extremities for several months. History of metastatic prostate cancer. EXAM: MRI  TOTAL SPINE WITHOUT AND WITH CONTRAST TECHNIQUE: Multisequence multiplanar MR imaging of the spine from the cervical spine to the sacrum was performed prior to and following IV contrast administration for evaluation of spinal metastatic disease. CONTRAST:  80mL MULTIHANCE GADOBENATE DIMEGLUMINE 529 MG/ML IV SOLN COMPARISON:  MRI of the lumbar spine November 09, 2016 FINDINGS: MRI CERVICAL SPINE FINDINGS ALIGNMENT: Straightened cervical lordosis.  No malalignment. VERTEBRAE/DISCS: Vertebral bodies are intact. Severe C3-4, moderate C4-5, moderate C5-6, moderate to severe C6-7, severe 7 C7-T1 disc height loss with moderate multilevel chronic discogenic endplate changes. No STIR signal abnormality to suggest acute fracture. Diffusely low bone marrow signal with multiple low T1, bright STIR enhancing metastasis throughout the cervical spine. Heterogeneous calvarium consistent with metastatic disease. CORD:Subcentimeter focus of T2 bright signal within the LEFT greater than RIGHT cervical spinal cord at C4 associated with cord compression. No syrinx, no abnormal cord, leptomeningeal or epidural enhancement. POSTERIOR FOSSA, VERTEBRAL ARTERIES, PARASPINAL TISSUES: No MR findings of ligamentous injury. Vertebral artery flow voids present. Included posterior fossa and paraspinal soft tissues are nonsuspicious. DISC LEVELS: C2-3: Small broad-based disc bulge asymmetric to the RIGHT. Uncovertebral hypertrophy. Severe RIGHT, mild LEFT facet arthropathy. No canal stenosis. Severe RIGHT, mild LEFT neural foraminal narrowing. C3-4: 2 mm broad-based disc bulge, uncovertebral hypertrophy mild facet arthropathy. Severe canal stenosis, AP dimension of the canal is 6 mm. Severe bilateral neural foraminal narrowing. C4-5: Small broad-based disc bulge, uncovertebral hypertrophy and mild facet arthropathy. No canal stenosis. Severe bilateral neural foraminal narrowing. C5-6: Small broad-based disc bulge, uncovertebral hypertrophy and mild  facet arthropathy. No canal stenosis. Moderate RIGHT, and moderate to severe LEFT neural foraminal narrowing. C6-7: Small broad-based disc bulge, uncovertebral hypertrophy and mild  facet arthropathy. No canal stenosis. Moderate bilateral neural foraminal narrowing. C7-T1: Small broad-based disc bulge, uncovertebral hypertrophy mild facet arthropathy. No canal stenosis. Moderate to severe bilateral neural foraminal narrowing. MRI THORACIC SPINE FINDINGS ALIGNMENT: Maintenance of the thoracic kyphosis. No malalignment. VERTEBRAE/DISCS: Diffusely low bone marrow signal with multiple low T1, bright STIR enhancing metastasis throughout the thoracic spine. Tumoral expansion of T4, T5, T6 and T7 middle column. Tumoral expansion RIGHT T7 pedicle and facet, LEFT T8 pedicle. Tumoral invasion of the RIGHT epidural space from T5-6 2 T7-8, extending into the neural foramen, RIGHT lateral posterior pleural space and paraspinal soft tissues, RIGHT T7 rib. No fracture deformity. Metastasis in all included ribs, including multifocal tumoral expansion. CORD: Multilevel cord compression, however are no cord edema. No abnormal cord, leptomeningeal enhancement. PREVERTEBRAL AND PARASPINAL SOFT TISSUES:  Nonacute. DISC LEVELS: At T6-7 and T7 RIGHT pedicle and epidural tumor effaces the thecal sac and mildly deforms the spinal cord. Mild canal stenosis. Tumor completely effaces the RIGHT C6-7 neural foramen. Tumor mildly narrows the RIGHT T7-8 neural foramen. Lower lumbar small broad-based disc bulges and facet arthropathy. Mild-to-moderate T9-10 neural foraminal narrowing. MRI LUMBAR SPINE FINDINGS- moderately motion degraded examination. SEGMENTATION: For the purposes of this report, the last well-formed intervertebral disc will be described as L5-S1. ALIGNMENT: Maintenance of the lumbar lordosis. No malalignment. VERTEBRAE:Diffusely low bone marrow signal with multiple low T1, bright STIR enhancing metastasis throughout the lumbar  spine. No pathologic fracture. Moderate to severe L5-S1 disc height loss. Multilevel mild desiccation. Moderate to severe chronic discogenic endplate changes [id number], mild at the remaining lumbar levels. Expansile tumor RIGHT iliac bone invading the gluteal and paraspinal muscles. Congenital canal narrowing on the basis of foreshortened pedicles. Epidural lipomatosis. CONUS MEDULLARIS: Conus medullaris terminates at L1-2 and demonstrates normal morphology and signal characteristics. 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 No abnormal cord, leptomeningeal enhancement. No epidural tumor. PARASPINAL AND SOFT TISSUES: Paraspinal muscle bright interstitial STIR signal and enhancement most consistent with denervation. 2 cm gallstone. DISC LEVELS: L1-2: Annular bulging. Moderate facet arthropathy and ligamentum flavum redundancy without canal stenosis or neural foraminal narrowing. L2-3: 3 mm broad-based disc bulge. Moderate to severe facet arthropathy and ligamentum flavum redundancy. Epidural lipomatosis. Moderate osseous canal stenosis and severe thecal sac effacement, 5 mm. RIGHT lateral recess stenosis may affect the traversing RIGHT L3 nerve. Mild RIGHT neural foraminal narrowing. L3-4: Small broad-based disc bulge. Moderate to severe facet arthropathy and ligamentum flavum redundancy. Trace facet effusions which are likely reactive. Moderate canal stenosis. Epidural lipomatosis. Severe effacement thecal sac narrowed to 4 mm. Mild bilateral neural foraminal narrowing. L4-5: 2 mm broad-based disc bulge. Moderate RIGHT and severe LEFT facet arthropathy and ligamentum flavum redundancy with trace facet effusions which are likely reactive. Epidural lipomatosis. Moderate to severe canal stenosis, severe effacement thecal sac to 5 mm. LEFT lateral recess stenosis likely affects the traversing LEFT L5 nerve. Mild RIGHT, moderate LEFT neural foraminal narrowing. L5-S1: 5 mm broad-based disc bulge  asymmetric to the RIGHT, enhancing central annular fissure. Severe RIGHT and moderate LEFT facet arthropathy and ligamentum flavum redundancy. Epidural lipomatosis. Mild canal stenosis with partially effaced thecal sac. Moderate to severe RIGHT, mild LEFT neural foraminal narrowing. Encroachment upon the exited RIGHT L5 nerve. 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 Neural foraminal narrowing all cervical levels: Severe from C2-3 through C4-5. MRI THORACIC SPINE: Diffuse osseous metastasis  without pathologic fracture. Tumoral expansion and epidural invasion at T6-7 results in mild canal stenosis and severe RIGHT T6-7 neural foraminal narrowing. Displaced thoracic spinal cord without cord compression. MRI LUMBAR SPINE: Diffuse osseous metastasis without pathologic fracture. Degenerative change and epidural lipomatosis result in severe thecal sac effacement L2-3 through L4-5. Moderate to severe canal stenosis L4-5, moderate at L2-3 and L3-4. Neural foraminal narrowing L2-3 through L5-S1: Moderate to severe on the RIGHT at L5-S1. Acute findings discussed with and reconfirmed by Dr.Rancour on 12/19/2016 at 1:27 am. Electronically Signed   By: Elon Alas M.D.   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  PLAN:Today, I talked to the patient and family about the findings and work-up thus far.  We discussed the natural history of prostate cancer with painful bone metastasis and general treatment, highlighting the role of radiotherapy in the management.  We discussed the available radiation techniques, and focused on the details of logistics and delivery.  We reviewed the anticipated acute and late sequelae associated with radiation in this setting.  The patient was encouraged to  ask questions that I answered to the best of my ability. The patient would like to proceed with radiation. He will proceed with CT simulation following this appointment. A consent form was signed and a copy was placed in the patient's chart.  I spent 60 minutes minutes face to face with the patient and more than 50% of that time was spent in counseling and/or coordination of care.   ------------------------------------------------   Tyler Pita, MD [place] Director and Director of Stereotactic Radiosurgery Direct Dial: [phone]  Fax: [phone] [email or web address]  Skype  [email or web address]   This document serves as a record of services personally performed by Tyler Pita, MD. It was created on his behalf by Bethann Humble, a trained medical scribe. The creation of this record is based on the scribe's personal observations and the provider's statements to them. This document has been checked and approved by the attending provider.

## 2017-01-05 NOTE — Progress Notes (Signed)
  Radiation Oncology         [phone] ________________________________  Name: RYKAN VITTETOE MRN: [id number]  Date: 01/05/2017  DOB: September 07, 1951  SIMULATION AND TREATMENT PLANNING NOTE    ICD-9-CM ICD-10-CM   1. Prostate cancer metastatic to bone (HCC) 185 C61    198.5 C79.51     DIAGNOSIS:  66 yo man with painful spine metastases from prostate cancer  NARRATIVE:  The patient was brought to the [facility].  Identity was confirmed.  All relevant records and images related to the planned course of therapy were reviewed.  The patient freely provided informed written consent to proceed with treatment after reviewing the details related to the planned course of therapy. The consent form was witnessed and verified by the simulation staff.  Then, the patient was set-up in a stable reproducible  supine position for radiation therapy.  CT images were obtained.  Surface markings were placed.  The CT images were loaded into the planning software.  Then the target and avoidance structures were contoured.  Treatment planning then occurred.  The radiation prescription was entered and confirmed.  Then, I designed and supervised the construction of a total of 5 medically necessary complex treatment devices including BodyFix and 4 MLCs.  I have requested : Isodose Plan.    PLAN:  The patient will receive 30 Gy in 10 fractions to T5-T7 thoracic spine and L1-SIJ lumbar spine  ________________________________  Sheral Apley. Tammi Klippel, M.D.

## 2017-01-05 NOTE — Progress Notes (Signed)
Ryan Ware diagnosed with prostate cancer March 2017. He is being treated at the [facility] with Lupron and Xtandi. He consulted today with Dr. Tammi Klippel for treatment of spinal mets. He tolerated CT simulation but experienced quite a bit of pain. We discussed taking pain medication 45 minutes before his treatment to hopefully to decrease pain during treatment. He voiced understanding. He will begin treatments tomorrow. I will continue to follow.

## 2017-01-05 NOTE — Progress Notes (Signed)
See progress note under physician encounter. 

## 2017-01-06 ENCOUNTER — Ambulatory Visit
Admission: RE | Admit: 2017-01-06 | Payer: BLUE CROSS/BLUE SHIELD | Source: Ambulatory Visit | Admitting: Radiation Oncology

## 2017-01-06 DIAGNOSIS — Z510 Encounter for antineoplastic radiation therapy: Secondary | ICD-10-CM | POA: Diagnosis not present

## 2017-01-07 ENCOUNTER — Ambulatory Visit
Admission: RE | Admit: 2017-01-07 | Discharge: 2017-01-07 | Disposition: A | Discharge status: Home / Self Care | Payer: BLUE CROSS/BLUE SHIELD | Source: Ambulatory Visit | Attending: Radiation Oncology | Admitting: Radiation Oncology

## 2017-01-07 ENCOUNTER — Ambulatory Visit: Payer: BLUE CROSS/BLUE SHIELD

## 2017-01-07 DIAGNOSIS — Z510 Encounter for antineoplastic radiation therapy: Secondary | ICD-10-CM | POA: Diagnosis not present

## 2017-01-08 ENCOUNTER — Institutional Professional Consult (permissible substitution): Payer: BLUE CROSS/BLUE SHIELD | Admitting: Radiation Oncology

## 2017-01-08 ENCOUNTER — Ambulatory Visit
Admission: RE | Admit: 2017-01-08 | Discharge: 2017-01-08 | Disposition: A | Discharge status: Home / Self Care | Payer: BLUE CROSS/BLUE SHIELD | Source: Ambulatory Visit | Attending: Radiation Oncology | Admitting: Radiation Oncology

## 2017-01-08 DIAGNOSIS — Z510 Encounter for antineoplastic radiation therapy: Secondary | ICD-10-CM | POA: Diagnosis not present

## 2017-01-09 ENCOUNTER — Ambulatory Visit
Admission: RE | Admit: 2017-01-09 | Discharge: 2017-01-09 | Disposition: A | Discharge status: Home / Self Care | Payer: BLUE CROSS/BLUE SHIELD | Source: Ambulatory Visit | Attending: Radiation Oncology | Admitting: Radiation Oncology

## 2017-01-09 VITALS — BP 120/67 | HR 102 | Resp 18 | Wt 159.2 lb

## 2017-01-09 DIAGNOSIS — Z510 Encounter for antineoplastic radiation therapy: Secondary | ICD-10-CM | POA: Diagnosis not present

## 2017-01-09 DIAGNOSIS — C61 Malignant neoplasm of prostate: Secondary | ICD-10-CM

## 2017-01-09 DIAGNOSIS — C7951 Secondary malignant neoplasm of bone: Principal | ICD-10-CM

## 2017-01-09 NOTE — Progress Notes (Signed)
Weight stable. Heart rate slightly elevated. Reports low back pain that radiates to bilateral shoulders 6 on a scale of 0-10. Reports pain is a little more today due to ride to [facility] for platelets then, here. Reports hot flashes continue. Reports slight increase in the strength in his legs. Reports constipation continue. Provided patient with constipation handouts. Dex 4 mg bid with intent to taper to once daily next week. White coating noted on tongue but, no definitive thrush seen.   BP 120/67 (BP Location: Right Arm, Patient Position: Sitting, Cuff Size: Normal)   Pulse (!) 102   Resp 18   Wt 159 lb 3.2 oz (72.2 kg)   SpO2 100%   BMI 18.88 kg/m  Wt Readings from Last 3 Encounters:  01/09/17 159 lb 3.2 oz (72.2 kg)  01/05/17 159 lb (72.1 kg)  11/14/16 200 lb (90.7 kg)

## 2017-01-09 NOTE — Progress Notes (Signed)
  Radiation Oncology         [phone]   Name: Ryan Ware MRN: [id number]   Date: 01/09/2017  DOB: 1951-11-10   Weekly Radiation Therapy Management    ICD-9-CM ICD-10-CM   1. Prostate cancer metastatic to bone (HCC) 185 C61    198.5 C79.51     Current Dose: 9 Gy  Planned Dose:  30 Gy  Narrative The patient presents for routine under treatment assessment.  Weight stable, but heart rate slightly elevated. Reports low back pain that radiates to his bilateral shoulders as a 6/10. Reports the pain is a little more today due to ride to [facility] for platelets then here. Reports hot flashes continue. Reports slight increase in the strength in his legs. Reports constipation continues. He is using Miralax and will use suppositories soon. The patient reports nausea the first day of treatment, but none since. Dex 4 mg BID with intent to taper to once daily next week. White coating noted on the tongue by the nurse, but no definitive thrush seen.  Set-up films were reviewed. The chart was checked.  Physical Findings  weight is 159 lb 3.2 oz (72.2 kg). His blood pressure is 120/67 and his pulse is 102 (abnormal). His respiration is 18 and oxygen saturation is 100%. . Weight essentially stable.  No significant changes. Alert and oriented x 3. Presents in a wheelchair.  Impression The patient is tolerating radiation.  Plan Continue treatment as planned. Nursing provided the patient with constipation handouts. Continue Dex 4 mg BID and then he could taper starting next week.         Ryan Ware Ryan Ware, M.D.  This document serves as a record of services personally performed by Ryan Pita, MD. It was created on his behalf by Ryan Ware, a trained medical scribe. The creation of this record is based on the scribe's personal observations and the provider's statements to them. This document has been checked and approved by the attending provider.

## 2017-01-12 ENCOUNTER — Ambulatory Visit
Admission: RE | Admit: 2017-01-12 | Discharge: 2017-01-12 | Disposition: A | Discharge status: Home / Self Care | Payer: BLUE CROSS/BLUE SHIELD | Source: Ambulatory Visit | Attending: Radiation Oncology | Admitting: Radiation Oncology

## 2017-01-12 DIAGNOSIS — Z510 Encounter for antineoplastic radiation therapy: Secondary | ICD-10-CM | POA: Diagnosis not present

## 2017-01-13 ENCOUNTER — Ambulatory Visit
Admission: RE | Admit: 2017-01-13 | Discharge: 2017-01-13 | Disposition: A | Discharge status: Home / Self Care | Payer: BLUE CROSS/BLUE SHIELD | Source: Ambulatory Visit | Attending: Radiation Oncology | Admitting: Radiation Oncology

## 2017-01-13 DIAGNOSIS — Z510 Encounter for antineoplastic radiation therapy: Secondary | ICD-10-CM | POA: Diagnosis not present

## 2017-01-14 ENCOUNTER — Telehealth: Payer: Self-pay | Admitting: *Deleted

## 2017-01-14 ENCOUNTER — Ambulatory Visit
Admission: RE | Admit: 2017-01-14 | Discharge: 2017-01-14 | Disposition: A | Discharge status: Home / Self Care | Payer: BLUE CROSS/BLUE SHIELD | Source: Ambulatory Visit | Attending: Radiation Oncology | Admitting: Radiation Oncology

## 2017-01-14 DIAGNOSIS — Z510 Encounter for antineoplastic radiation therapy: Secondary | ICD-10-CM | POA: Diagnosis not present

## 2017-01-14 NOTE — Telephone Encounter (Signed)
ON 01[facility]14-[facility]

## 2017-01-15 ENCOUNTER — Ambulatory Visit
Admission: RE | Admit: 2017-01-15 | Discharge: 2017-01-15 | Disposition: A | Discharge status: Home / Self Care | Payer: BLUE CROSS/BLUE SHIELD | Source: Ambulatory Visit | Attending: Radiation Oncology | Admitting: Radiation Oncology

## 2017-01-15 ENCOUNTER — Ambulatory Visit: Admission: RE | Admit: 2017-01-15 | Payer: BLUE CROSS/BLUE SHIELD | Source: Ambulatory Visit

## 2017-01-15 VITALS — BP 139/83 | HR 103 | Temp 97.5°F | Resp 18 | Wt 161.6 lb

## 2017-01-15 DIAGNOSIS — C7951 Secondary malignant neoplasm of bone: Principal | ICD-10-CM

## 2017-01-15 DIAGNOSIS — C61 Malignant neoplasm of prostate: Secondary | ICD-10-CM

## 2017-01-15 MED ORDER — FLUCONAZOLE 100 MG PO TABS: 100.0000 mg | ORAL_TABLET | Freq: Every day | ORAL | 0 refills | Status: AC

## 2017-01-15 NOTE — Addendum Note (Signed)
Encounter addended by: Heywood Footman, RN on: 01/15/2017  2:07 PM<BR>    Actions taken: Sign clinical note

## 2017-01-15 NOTE — Progress Notes (Signed)
One month follow up appointment card given. 

## 2017-01-15 NOTE — Progress Notes (Signed)
  Radiation Oncology         [phone]   Name: Ryan Ware MRN: [id number]   Date: 01/15/2017  DOB: January 26, 1951   Weekly Radiation Therapy Management    ICD-9-CM ICD-10-CM   1. Prostate cancer metastatic to bone (HCC) 185 C61    198.5 C79.51     Current Dose: 18 Gy  Planned Dose:  30 Gy  Narrative The patient presents for routine under treatment assessment.  Weight stable, but heart rate slightly elevated. Reports low back pain that radiates to his bilateral shoulders as a 6/10. Reports new onset of quick, sharp low back pain with movement. He reports continued hot flashes, and constipation. He reports improving strength in his legs. He is taking Decadron 4 mg once per day. Per nursing, questionable thrush on base of tongue. He denies sore throat. Patient also complains of decreased sleep. [facility]  Set-up films were reviewed. The chart was checked.  Physical Findings  weight is 161 lb 9.6 oz (73.3 kg). His oral temperature is 97.5 F (36.4 C). His blood pressure is 139/83 and his pulse is 103 (abnormal). His respiration is 18 and oxygen saturation is 97%. . Weight essentially stable.  No significant changes. Presents in a wheelchair. Thrush noted on buccal mucosa.  Impression The patient is tolerating radiation.  Plan Continue treatment as planned. Prescribed Diflucan for thrush. Continue steroid taper.         Sheral Apley Tammi Klippel, M.D.  This document serves as a record of services personally performed by Tyler Pita, MD. It was created on his behalf by Bethann Humble, a trained medical scribe. The creation of this record is based on the scribe's personal observations and the provider's statements to them. This document has been checked and approved by the attending provider.

## 2017-01-15 NOTE — Progress Notes (Signed)
Weight stable. Heart rate elevated. Reports low back pain that radiates to his shoulders 6 on a scale of 0-10. Reports new onset of quick sharp low back pain with movement. Reports hot flashes continues. Reports constipation continues. Reports strength in his legs continue to improve. Reports he is taking decadron 4 mg once per day. Questionable thrush base of tongue. Denies sore throat. Scheduled to follow up at the [place] later today ]  BP 139/83   Pulse (!) 103   Temp 97.5 F (36.4 C) (Oral)   Resp 18   Wt 161 lb 9.6 oz (73.3 kg)   SpO2 97%   BMI 19.16 kg/m  Wt Readings from Last 3 Encounters:  01/15/17 161 lb 9.6 oz (73.3 kg)  01/09/17 159 lb 3.2 oz (72.2 kg)  01/05/17 159 lb (72.1 kg)

## 2017-01-16 ENCOUNTER — Ambulatory Visit: Payer: BLUE CROSS/BLUE SHIELD

## 2017-01-19 ENCOUNTER — Ambulatory Visit
Admission: RE | Admit: 2017-01-19 | Discharge: 2017-01-19 | Disposition: A | Discharge status: Home / Self Care | Payer: BLUE CROSS/BLUE SHIELD | Source: Ambulatory Visit | Attending: Radiation Oncology | Admitting: Radiation Oncology

## 2017-01-19 ENCOUNTER — Ambulatory Visit: Payer: BLUE CROSS/BLUE SHIELD

## 2017-01-19 DIAGNOSIS — Z510 Encounter for antineoplastic radiation therapy: Secondary | ICD-10-CM | POA: Diagnosis not present

## 2017-01-20 ENCOUNTER — Ambulatory Visit: Payer: BLUE CROSS/BLUE SHIELD

## 2017-01-20 ENCOUNTER — Ambulatory Visit
Admission: RE | Admit: 2017-01-20 | Discharge: 2017-01-20 | Disposition: A | Discharge status: Home / Self Care | Payer: BLUE CROSS/BLUE SHIELD | Source: Ambulatory Visit | Attending: Radiation Oncology | Admitting: Radiation Oncology

## 2017-01-20 DIAGNOSIS — Z510 Encounter for antineoplastic radiation therapy: Secondary | ICD-10-CM | POA: Diagnosis not present

## 2017-01-21 ENCOUNTER — Ambulatory Visit: Payer: BLUE CROSS/BLUE SHIELD

## 2017-01-21 ENCOUNTER — Ambulatory Visit
Admission: RE | Admit: 2017-01-21 | Discharge: 2017-01-21 | Disposition: A | Discharge status: Home / Self Care | Payer: BLUE CROSS/BLUE SHIELD | Source: Ambulatory Visit | Attending: Radiation Oncology | Admitting: Radiation Oncology

## 2017-01-21 DIAGNOSIS — Z510 Encounter for antineoplastic radiation therapy: Secondary | ICD-10-CM | POA: Diagnosis not present

## 2017-01-22 ENCOUNTER — Ambulatory Visit
Admission: RE | Admit: 2017-01-22 | Discharge: 2017-01-22 | Disposition: A | Discharge status: Home / Self Care | Payer: BLUE CROSS/BLUE SHIELD | Source: Ambulatory Visit | Attending: Radiation Oncology | Admitting: Radiation Oncology

## 2017-01-22 ENCOUNTER — Encounter: Payer: Self-pay | Admitting: Radiation Oncology

## 2017-01-22 DIAGNOSIS — Z510 Encounter for antineoplastic radiation therapy: Secondary | ICD-10-CM | POA: Diagnosis not present

## 2017-01-23 NOTE — Progress Notes (Signed)
  Radiation Oncology         [phone] ________________________________  Name: Ryan Ware MRN: [id number]  Date: 01/22/2017  DOB: December 28, 1950  End of Treatment Note  Diagnosis:  66 y.o. 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  Indication for treatment:  Palliative  Radiation treatment dates:   01/07/17 - 01/22/17  Site/dose:    1) T5-T8 Thoracic Spine: 30 Gy in 10 fractions. 2) L1-SIJ Lumbar Spine: 30 Gy in 10 fractions.  Beams/energy:    1) Isodose Plan // 10X, 15X Photon 2) 3D // 15X Photon  Narrative: The patient tolerated radiation treatment relatively well. The patient has lower back pain as a 6-9/10. He would take oxycodone that provided some relief an hour after use. He has a new onset of pain associated with swallowing and a poor appetite and poor sleep. He has hot flashes and constipation. The patient is on Decadron 4 mg daily with thrush noted on his buccal mucosa. He was prescribed Diflucan. The patient would have tingling in his hips and feet intermittently. The patient is also fatigued.  Plan: The patient has completed radiation treatment. The patient will return to radiation oncology clinic for routine followup in one month. I advised him to call or return sooner if he has any questions or concerns related to his recovery or treatment.  We will follow-up with him to ensure decadron taper. ________________________________  Sheral Apley Tammi Klippel, M.D.  This document serves as a record of services personally performed by Tyler Pita, MD. It was created on his behalf by Darcus Austin, a trained medical scribe. The creation of this record is based on the scribe's personal observations and the provider's statements to them. This document has been checked and approved by the attending provider.

## 2017-01-26 ENCOUNTER — Telehealth: Payer: Self-pay | Admitting: Radiation Oncology

## 2017-01-26 ENCOUNTER — Encounter: Payer: Self-pay | Admitting: Radiation Oncology

## 2017-01-26 NOTE — Telephone Encounter (Signed)
Phoned patient's brother, JB. Patient is occasionally confused and has poor recall of his medications. JB reports the patient ran out of decadron several days ago (he believes 3 days) but, "is doing fine" and without complaints. Will inform Dr. Tammi Klippel of these finding. JB understands if further decadron therapy is needed this RN will call him back.

## 2017-01-26 NOTE — Telephone Encounter (Signed)
Yes, sir. Will do. Sam

## 2017-01-28 ENCOUNTER — Ambulatory Visit: Payer: BLUE CROSS/BLUE SHIELD | Admitting: Urology

## 2017-01-28 ENCOUNTER — Observation Stay (HOSPITAL_COMMUNITY)
Admission: EM | Admit: 2017-01-28 | Discharge: 2017-01-30 | Disposition: A | Discharge status: Home / Self Care | Payer: BLUE CROSS/BLUE SHIELD | Attending: Family Medicine | Admitting: Family Medicine

## 2017-01-28 ENCOUNTER — Emergency Department (HOSPITAL_COMMUNITY): Payer: BLUE CROSS/BLUE SHIELD

## 2017-01-28 ENCOUNTER — Encounter (HOSPITAL_COMMUNITY): Payer: Self-pay | Admitting: Emergency Medicine

## 2017-01-28 DIAGNOSIS — C61 Malignant neoplasm of prostate: Secondary | ICD-10-CM | POA: Diagnosis present

## 2017-01-28 DIAGNOSIS — R040 Epistaxis: Principal | ICD-10-CM | POA: Insufficient documentation

## 2017-01-28 DIAGNOSIS — B182 Chronic viral hepatitis C: Secondary | ICD-10-CM | POA: Diagnosis not present

## 2017-01-28 DIAGNOSIS — B1920 Unspecified viral hepatitis C without hepatic coma: Secondary | ICD-10-CM | POA: Diagnosis present

## 2017-01-28 DIAGNOSIS — F17210 Nicotine dependence, cigarettes, uncomplicated: Secondary | ICD-10-CM | POA: Insufficient documentation

## 2017-01-28 DIAGNOSIS — D696 Thrombocytopenia, unspecified: Secondary | ICD-10-CM | POA: Diagnosis not present

## 2017-01-28 DIAGNOSIS — Z79899 Other long term (current) drug therapy: Secondary | ICD-10-CM | POA: Diagnosis not present

## 2017-01-28 DIAGNOSIS — C7951 Secondary malignant neoplasm of bone: Secondary | ICD-10-CM

## 2017-01-28 DIAGNOSIS — D61818 Other pancytopenia: Secondary | ICD-10-CM | POA: Diagnosis not present

## 2017-01-28 DIAGNOSIS — Z8546 Personal history of malignant neoplasm of prostate: Secondary | ICD-10-CM | POA: Insufficient documentation

## 2017-01-28 DIAGNOSIS — D649 Anemia, unspecified: Secondary | ICD-10-CM | POA: Diagnosis not present

## 2017-01-28 LAB — BASIC METABOLIC PANEL
ANION GAP: 8 (ref 5–15)
BUN: 17 mg/dL (ref 6–20)
CHLORIDE: 106 mmol/L (ref 101–111)
CO2: 26 mmol/L (ref 22–32)
Calcium: 9.3 mg/dL (ref 8.9–10.3)
Creatinine, Ser: 0.68 mg/dL (ref 0.61–1.24)
GFR calc non Af Amer: >60 mL/min (ref >60)
Glucose, Bld: 119 mg/dL — ABNORMAL HIGH (ref 65–99)
POTASSIUM: 4.3 mmol/L (ref 3.5–5.1)
Sodium: 140 mmol/L (ref 135–145)

## 2017-01-28 LAB — HEPATIC FUNCTION PANEL
ALBUMIN: 2.9 g/dL — AB (ref 3.5–5.0)
ALT: 180 U/L — ABNORMAL HIGH (ref 17–63)
AST: 249 U/L — ABNORMAL HIGH (ref 15–41)
Alkaline Phosphatase: 224 U/L — ABNORMAL HIGH (ref 38–126)
BILIRUBIN INDIRECT: 0.9 mg/dL (ref 0.3–0.9)
Bilirubin, Direct: 0.5 mg/dL (ref 0.1–0.5)
TOTAL PROTEIN: 6 g/dL — AB (ref 6.5–8.1)
Total Bilirubin: 1.4 mg/dL — ABNORMAL HIGH (ref 0.3–1.2)

## 2017-01-28 LAB — CBC WITH DIFFERENTIAL/PLATELET
BASOS PCT: 2 %
Basophils Absolute: 0.1 10*3/uL (ref 0.0–0.1)
EOS PCT: 2 %
Eosinophils Absolute: 0.1 10*3/uL (ref 0.0–0.7)
HCT: 27.6 % — ABNORMAL LOW (ref 39.0–52.0)
Hemoglobin: 8.8 g/dL — ABNORMAL LOW (ref 13.0–17.0)
LYMPHS PCT: 41 %
Lymphs Abs: 1 10*3/uL (ref 0.7–4.0)
MCH: 27.8 pg (ref 26.0–34.0)
MCHC: 31.9 g/dL (ref 30.0–36.0)
MCV: 87.3 fL (ref 78.0–100.0)
Monocytes Absolute: 0.3 10*3/uL (ref 0.1–1.0)
Monocytes Relative: 13 %
NEUTROS PCT: 42 %
Neutro Abs: 1 10*3/uL — ABNORMAL LOW (ref 1.7–7.7)
Platelets: 28 10*3/uL — CL (ref 150–400)
RBC: 3.16 MIL/uL — AB (ref 4.22–5.81)
RDW: 20.7 % — ABNORMAL HIGH (ref 11.5–15.5)
WBC: 2.5 10*3/uL — AB (ref 4.0–10.5)

## 2017-01-28 LAB — PREPARE RBC (CROSSMATCH)

## 2017-01-28 LAB — ABO/RH: ABO/RH(D): B POS

## 2017-01-28 LAB — PROTIME-INR
INR: 1.4
PROTHROMBIN TIME: 17.3 s — AB (ref 11.4–15.2)

## 2017-01-28 LAB — APTT: aPTT: 41 seconds — ABNORMAL HIGH (ref 24–36)

## 2017-01-28 LAB — D-DIMER, QUANTITATIVE (NOT AT ARMC)

## 2017-01-28 LAB — FIBRINOGEN: FIBRINOGEN: 105 mg/dL — AB (ref 210–475)

## 2017-01-28 MED ORDER — SODIUM CHLORIDE 0.9% FLUSH
3.0000 mL | Freq: Two times a day (BID) | INTRAVENOUS | Status: DC
  Administered 2017-01-28 – 2017-01-30 (×5): 3 mL via INTRAVENOUS

## 2017-01-28 MED ORDER — SODIUM CHLORIDE 0.9 % IV SOLN: Freq: Once | INTRAVENOUS | Status: DC

## 2017-01-28 MED ORDER — PANTOPRAZOLE SODIUM 40 MG PO TBEC
40.0000 mg | DELAYED_RELEASE_TABLET | Freq: Every day | ORAL | Status: DC
  Administered 2017-01-28 – 2017-01-30 (×3): 40 mg via ORAL
  Filled 2017-01-28 (×3): qty 1

## 2017-01-28 MED ORDER — SODIUM CHLORIDE 0.9 % IV SOLN: 250.0000 mL | INTRAVENOUS | Status: DC | PRN

## 2017-01-28 MED ORDER — POLYETHYLENE GLYCOL 3350 17 G PO PACK
17.0000 g | PACK | Freq: Every day | ORAL | Status: DC | PRN
  Filled 2017-01-28: qty 1

## 2017-01-28 MED ORDER — ENSURE ENLIVE PO LIQD
237.0000 mL | Freq: Two times a day (BID) | ORAL | Status: DC
  Administered 2017-01-28 – 2017-01-29 (×3): 237 mL via ORAL

## 2017-01-28 MED ORDER — MORPHINE SULFATE (PF) 2 MG/ML IV SOLN
2.0000 mg | INTRAVENOUS | Status: DC | PRN
  Administered 2017-01-28: 2 mg via INTRAVENOUS
  Filled 2017-01-28: qty 1

## 2017-01-28 MED ORDER — OXYCODONE HCL 5 MG PO TABS
15.0000 mg | ORAL_TABLET | Freq: Four times a day (QID) | ORAL | Status: DC
  Administered 2017-01-28 – 2017-01-30 (×9): 15 mg via ORAL
  Filled 2017-01-28 (×21): qty 3

## 2017-01-28 MED ORDER — SODIUM CHLORIDE 0.9 % IV SOLN: 10.0000 mL/h | Freq: Once | INTRAVENOUS | Status: DC

## 2017-01-28 MED ORDER — SODIUM CHLORIDE 0.9% FLUSH: 3.0000 mL | INTRAVENOUS | Status: DC | PRN

## 2017-01-28 MED ORDER — OXYMETAZOLINE HCL 0.05 % NA SOLN
2.0000 | Freq: Once | NASAL | Status: AC
  Administered 2017-01-28: 2 via NASAL
  Filled 2017-01-28: qty 15

## 2017-01-28 NOTE — ED Notes (Addendum)
Consent for blood administration reviewed and read to pt and signed by pt and myself. Pt educated to notify nurse if he has any signs or symptoms of reaction.

## 2017-01-28 NOTE — ED Notes (Signed)
CRITICAL VALUE ALERT  Critical value received:  Platelets 28  Date of notification:  01/28/17  Time of notification:  3779  Critical value read back:Yes.    Nurse who received alert:  Mirna Mires   MD notified (1st page): Dr. Thurnell Garbe

## 2017-01-28 NOTE — Progress Notes (Signed)
Patient to receive 1 unit PRBCs today per report from ER nurse this afternoon, unit had been started in ER prior to admit to rm [id number]. Order to prepare 2 units PRBCs noted. disucssed with Dr. Sarajane Jews. Stated would like patient to receive 1 unit of platelets as ordered this evening and then receive 1 more unit of PRBCs after that. Blood bank states 1 more unit of PRBCs ready for transfusion. Pt aware of plan. Donavan Foil, RN

## 2017-01-28 NOTE — ED Provider Notes (Signed)
Ryan Ware DEPT Provider Note   CSN: [id number] Arrival date & time: 01/28/17  6962     History   Chief Complaint Chief Complaint  Patient presents with  . Epistaxis    HPI Ryan Ware is a 66 y.o. male.   Epistaxis      Pt was seen at 1010. Per pt, c/o gradual onset and persistence of multiple intermittent episodes of left nosebleed that began 3 days ago. Pt states the nosebleed improves, then "comes back." Pt states today's nosebleed has been present for the past 3 hours "on and off." Pt has been treating his nosebleeds by putting a tissue up his left nares. Pt states he has hx of prostate CA with bone mets, LD Rads last week. Pt also c/o acute flair of his chronic LBP since "twisting when I fell off" his potty chair a few days ago. Pt states the fall "aggravated" his low back area. Denies hitting head, no LOC, no neck pain, no CP/SOB, no abd pain, no N/V/D, no focal motor weakness.     Past Medical History:  Diagnosis Date  . Bone cancer ([place])    prostate ca with spine mets  . Hepatitis C   . Prostate cancer [facility])     Patient Active Problem List   Diagnosis Date Noted  . Protein-calorie malnutrition, severe 12/20/2016  . Cord compression ([place]) 12/19/2016  . Prostate cancer metastatic to bone ([place]) 12/19/2016  . Hyponatremia 12/19/2016  . Hepatitis C without hepatic coma 12/19/2016    Past Surgical History:  Procedure Laterality Date  . HAND SURGERY    . PROSTATE BIOPSY         Home Medications    Prior to Admission medications   Medication Sig Start Date End Date Taking? Authorizing Provider  degarelix (FIRMAGON) 120 MG injection Inject 240 mg into the skin once.    Historical Provider, MD  denosumab (PROLIA) 60 MG/ML SOLN injection Inject 60 mg into the skin every 6 (six) months. Administer in upper arm, thigh, or abdomen    Historical Provider, MD  dexamethasone (DECADRON) 1 MG tablet Take 4 tabs for 2 days, then take 3 tabs for 2 days, then  take 2 tabs for 2 days, then take 1 tab for 2 days, then stop. Patient taking differently: Take 4 tabs for 2 days, then take 3 tabs for 2 days, then take 2 tabs for 2 days, then take 1 tab for 2 days, then stop. Decadron stopped on 01/24/17. 12/21/16   Verlee Monte, MD  docusate sodium (COLACE) 100 MG capsule Take 100 mg by mouth 2 (two) times daily.    Historical Provider, MD  enzalutamide Gillermina Phy) 40 MG capsule Take 160 mg by mouth daily.    Historical Provider, MD  omeprazole (PRILOSEC) 20 MG capsule Take 20 mg by mouth daily.    Historical Provider, MD  Oxycodone HCl 10 MG TABS Take 10 mg by mouth every 6 (six) hours.    Historical Provider, MD  polyethylene glycol (MIRALAX / GLYCOLAX) packet Take 17 g by mouth daily as needed.    Historical Provider, MD    Family History Family History  Problem Relation Age of Onset  . Hypertension Mother   . Osteoarthritis Father   . Cancer Neg Hx     Social History Social History  Substance Use Topics  . Smoking status: Current Every Day Smoker    Packs/day: 0.50    Years: 50.00    Types: Cigarettes  . Smokeless tobacco:  Never Used  . Alcohol use Yes     Comment: occasional     Allergies   Acetaminophen   Review of Systems Review of Systems  HENT: Positive for nosebleeds.   ROS: Statement: All systems negative except as marked or noted in the HPI; Constitutional: Negative for fever and chills. ; ; Eyes: Negative for eye pain, redness and discharge. ; ; ENMT: +nosebleed. Negative for ear pain, hoarseness, nasal congestion, sinus pressure and sore throat. ; ; Cardiovascular: Negative for chest pain, palpitations, diaphoresis, dyspnea and peripheral edema. ; ; Respiratory: Negative for cough, wheezing and stridor. ; ; Gastrointestinal: Negative for nausea, vomiting, diarrhea, abdominal pain, blood in stool, hematemesis, jaundice and rectal bleeding. . ; ; Genitourinary: Negative for dysuria, flank pain and hematuria. ; ; Musculoskeletal: +LBP.  Negative for neck pain. Negative for swelling and deformity.; ; Skin: Negative for pruritus, rash, abrasions, blisters, bruising and skin lesion.; ; Neuro: Negative for headache, lightheadedness and neck stiffness. Negative for weakness, altered level of consciousness, altered mental status, extremity weakness, paresthesias, involuntary movement, seizure and syncope.        Physical Exam Updated Vital Signs BP 118/81 (BP Location: Left Arm)   Pulse 117   Temp 97.7 F (36.5 C) (Oral)   Resp 18   Ht 6\' 5"  (1.956 m)   Wt 161 lb (73 kg)   SpO2 97%   BMI 19.09 kg/m   10:32:08 Orthostatic Vital Signs MM  Orthostatic Lying   BP- Lying: 114/78  Pulse- Lying: 111      Orthostatic Sitting  BP- Sitting: 112/80  Pulse- Sitting: 112      Orthostatic Standing at 0 minutes  BP- Standing at 0 minutes: 116/84  Pulse- Standing at 0 minutes: 125     Physical Exam 1015: Physical examination:  Nursing notes reviewed; Vital signs and O2 SAT reviewed;  Constitutional: Thin, frail, Well hydrated, In no acute distress; Head:  Normocephalic, atraumatic; Eyes: EOMI, PERRL, No scleral icterus; ENMT: Mouth and pharynx normal, Mucous membranes moist. +gauze inside left nares.; Neck: Supple, Full range of motion, No lymphadenopathy; Cardiovascular: Regular rate and rhythm, No gallop; Respiratory: Breath sounds clear & equal bilaterally, No wheezes.  Speaking full sentences with ease, Normal respiratory effort/excursion; Chest: Nontender, Movement normal; Abdomen: Soft, Nontender, Nondistended, Normal bowel sounds; Genitourinary: No CVA tenderness;  Spine:  No midline CS, TS, LS tenderness. +TTP bilat lumbar paraspinal muscles.;; Extremities: Pulses normal, No tenderness, No edema, No calf edema or asymmetry.; Neuro: AA&Ox3, Major CN grossly intact.  Speech clear. No gross focal motor or sensory deficits in extremities.; Skin: Color normal, Warm, Dry.   ED Treatments / Results  Labs (all labs ordered are  listed, but only abnormal results are displayed)   EKG  EKG Interpretation None       Radiology   Procedures Procedures (including critical care time)  Medications Ordered in ED Medications  oxymetazoline (AFRIN) 0.05 % nasal spray 2 spray (not administered)     Initial Impression / Assessment and Plan / ED Course  I have reviewed the triage vital signs and the nursing notes.  Pertinent labs & imaging results that were available during my care of the patient were reviewed by me and considered in my medical decision making (see chart for details).  MDM Reviewed: previous chart, nursing note and vitals Reviewed previous: MRI and labs Interpretation: x-ray and labs Total time providing critical care: 30-74 minutes. This excludes time spent performing separately reportable procedures and services. Consults: admitting MD  CRITICAL CARE Performed by: Alfonzo Feller Total critical care time: 35 minutes Critical care time was exclusive of separately billable procedures and treating other patients. Critical care was necessary to treat or prevent imminent or life-threatening deterioration. Critical care was time spent personally by me on the following activities: development of treatment plan with patient and/or surrogate as well as nursing, discussions with consultants, evaluation of patient's response to treatment, examination of patient, obtaining history from patient or surrogate, ordering and performing treatments and interventions, ordering and review of laboratory studies, ordering and review of radiographic studies, pulse oximetry and re-evaluation of patient's condition.   Results for orders placed or performed during the hospital encounter of 96/78/93  Basic metabolic panel  Result Value Ref Range   Sodium 140 135 - 145 mmol/L   Potassium 4.3 3.5 - 5.1 mmol/L   Chloride 106 101 - 111 mmol/L   CO2 26 22 - 32 mmol/L   Glucose, Bld 119 (H) 65 - 99 mg/dL   BUN 17 6  - 20 mg/dL   Creatinine, Ser 0.68 0.61 - 1.24 mg/dL   Calcium 9.3 8.9 - 10.3 mg/dL   GFR calc non Af Amer >60 >60 mL/min   GFR calc Af Amer >60 >60 mL/min   Anion gap 8 5 - 15  CBC with Differential  Result Value Ref Range   WBC 2.5 (L) 4.0 - 10.5 K/uL   RBC 3.16 (L) 4.22 - 5.81 MIL/uL   Hemoglobin 8.8 (L) 13.0 - 17.0 g/dL   HCT 27.6 (L) 39.0 - 52.0 %   MCV 87.3 78.0 - 100.0 fL   MCH 27.8 26.0 - 34.0 pg   MCHC 31.9 30.0 - 36.0 g/dL   RDW 20.7 (H) 11.5 - 15.5 %   Platelets 28 (LL) 150 - 400 K/uL   Neutrophils Relative % 42 %   Lymphocytes Relative 41 %   Monocytes Relative 13 %   Eosinophils Relative 2 %   Basophils Relative 2 %   Neutro Abs 1.0 (L) 1.7 - 7.7 K/uL   Lymphs Abs 1.0 0.7 - 4.0 K/uL   Monocytes Absolute 0.3 0.1 - 1.0 K/uL   Eosinophils Absolute 0.1 0.0 - 0.7 K/uL   Basophils Absolute 0.1 0.0 - 0.1 K/uL   Smear Review PLATELET COUNT CONFIRMED BY SMEAR   Protime-INR  Result Value Ref Range   Prothrombin Time 17.3 (H) 11.4 - 15.2 seconds   INR 1.40    Dg Lumbar Spine Complete Result Date: 01/28/2017 CLINICAL DATA:  Low back pain secondary to a twisting injury this morning. Known metastatic prostate cancer to bone. Non diffuse metastatic disease in the lumbar spine and pelvic bones. EXAM: LUMBAR SPINE - COMPLETE 4+ VIEW COMPARISON:  Radiographs dated 11/09/2016 and bone scan dated 12/16/2016 and MRI dated 11/09/2016 FINDINGS: There is inhomogeneous patchy density throughout the visualized portion of the skeleton consistent with metastatic prostate cancer. There is loss of the left pedicle of L5 consistent with a lytic lesion. There is no acute fracture. Calcification in the abdominal aorta. IMPRESSION: Extensive metastatic disease throughout the visualized portion of the skeleton with a lytic lesion of the left pedicle of L5. No acute abnormalities. Aortic atherosclerosis. Electronically Signed   By: Lorriane Shire M.D.   On: 01/28/2017 11:10   Results for EMIGDIO, WILDEMAN (MRN [id number]) as of 01/28/2017 11:17  Ref. Range 12/20/2016 10:00 12/21/2016 03:32 01/28/2017 10:24  Hemoglobin Latest Ref Range: 13.0 - 17.0 g/dL 10.3 (L) 10.5 (L) 8.8 (L)  HCT  Latest Ref Range: 39.0 - 52.0 % 30.8 (L) 32.0 (L) 27.6 (L)  Platelets Latest Ref Range: 150 - 400 K/uL 104 (L) 114 (L) 28 (LL)    1130:   Pt tachycardic during orthostatic VS, BP stable. H/H and platelets lower than baseline. T&S ordered; will transfuse PRBC's. Will also need platelets transfusion.  Afrin applied to nares and gauze removed: No further epistaxis.  Dx and testing d/w pt and family.  Questions answered.  Verb understanding, agreeable to admit. T/C to [facility] Dr. Sarajane Jews, case discussed, including:  HPI, pertinent PM/SHx, VS/PE, dx testing, ED course and treatment:  Agreeable to admit.     Final Clinical Impressions(s) / ED Diagnoses   Final diagnoses:  None    New Prescriptions New Prescriptions   No medications on file      Francine Graven, DO 02/01/17 1512

## 2017-01-28 NOTE — Plan of Care (Signed)
Problem: Safety: Goal: Ability to remain free from injury will improve Outcome: Progressing Discussed fall prevention/safety plan with patient. Instructed to call for assistance and not attempt to get out of bed without assistance. Call light and personal items kept within reach. Pt verbalized understanding. Donavan Foil, RN

## 2017-01-28 NOTE — H&P (Signed)
History and Physical  Ryan Ware:[id number] DOB: 1951/03/08 DOA: 01/28/2017  PCP: [name]  Ryan Priestly, MD cell [phone], pager [phone] Patient coming from: home  Chief Complaint: nose bleeding  HPI:  66 year old man PMH prostate cancer with metastatic disease to the spine, anemia, thrombocytopenia, leukopenia who presented to the emergency department with several-day history of recurrent epistaxis. Platelet count 28, referred for observation for thorough cytopenia with epistaxis and symptomatic anemia with generalized weakness.  Patient reports several days of intermittent epistaxis spontaneous in onset and resolution, no aggravating or alleviating factors, associated with generalized weakness, described as thin, reports generalized weakness. No definite rectal bleeding.  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 Recommendations and treatment plan as recommended by oncologist below.  ED Course: Afebrile, tachycardic, vital signs otherwise stable. Orthostatics positive for pulse. No bleeding in the emergency department other than very mild epistaxis which has not required further treatment.  Pertinent labs:  hemoglobin 8.8, platelet count 28, WBC 2.5, BMP unremarkable, AST Imaging: Lumbar spine imaging showed extensive metastatic disease of the spine.  Review of Systems:  Negative for fever, visual changes, sore throat, rash, new muscle aches, chest pain, SOB, dysuria, n/v/abdominal pain.  Past Medical History:  Diagnosis Date  . Bone cancer ([place])    prostate ca with spine mets  . Hepatitis C   . Prostate cancer [facility])      Past Surgical History:  Procedure Laterality Date  . HAND SURGERY    . PROSTATE BIOPSY       reports that he has been smoking Cigarettes.  He has a 25.00 pack-year smoking history. He has never used smokeless tobacco. He reports that he drinks alcohol. He reports that he does not use drugs.   Allergies  Allergen Reactions  . Acetaminophen Other (See Comments)    Liver problems    Family History  Problem Relation Age of Onset  . Hypertension Mother   . Osteoarthritis Father   . Cancer Neg Hx      Prior to Admission medications   Medication Sig Start Date End Date Taking? Authorizing Provider  degarelix (FIRMAGON) 120 MG injection Inject 240 mg into the skin once.   Yes Historical Provider, MD  denosumab (PROLIA) 60 MG/ML SOLN injection Inject 60 mg into the skin every 6 (six) months. Administer in upper arm, thigh, or abdomen   Yes Historical Provider, MD  docusate sodium (COLACE) 100 MG capsule Take 100 mg by mouth 2 (two) times daily.   Yes Historical Provider, MD  enzalutamide Gillermina Phy) 40 MG capsule Take 160 mg by mouth daily.   Yes Historical Provider, MD  Multiple Vitamin (MULTIVITAMIN WITH MINERALS) TABS tablet Take 1 tablet by mouth daily.   Yes Historical Provider, MD  omeprazole (PRILOSEC) 20 MG capsule Take 20 mg by mouth daily.   Yes Historical Provider, MD  Oxycodone HCl 10 MG TABS Take 15 mg by mouth every 6 (six) hours.    Yes Historical Provider, MD  polyethylene glycol (MIRALAX / GLYCOLAX) packet Take 17 g by mouth daily as needed.   Yes Historical Provider, MD  dexamethasone (DECADRON) 1 MG tablet Take 4 tabs for 2 days, then take 3 tabs for 2 days,  then take 2 tabs for 2 days, then take 1 tab for 2 days, then stop. Patient not taking: Reported on 01/28/2017 12/21/16   Verlee Monte, MD    Physical Exam: Vitals:   01/28/17 1000 01/28/17 1001 01/28/17 1100 01/28/17 1101  BP:  118/81 122/80   Pulse:  117  108  Resp:  18  21  Temp:  97.7 F (36.5 C)     TempSrc:  Oral    SpO2:  97%  99%  Weight: 73 kg (161 lb)     Height: 6\' 5"  (1.956 m)       Constitutional:  . Appears calm and comfortable Eyes:  . Pupils, irises, lids appear unremarkable ENMT:  . Hearing grossly normal. Lips and tongue appear unremarkable. There are some fresh blood in the oropharynx. Some clots is noted. Neck:  . No masses . no thyromegaly Respiratory:  . CTA bilaterally, no w/r/r.  . Respiratory effort normal. No retractions or accessory muscle use Cardiovascular:  . RRR, no m/r/g . No LE extremity edema   Abdomen:  . Soft, nontender, nondistended. Musculoskeletal:  . Digits/nails: no clubbing, cyanosis, petechiae, infection of the hands . Moves all extremities to command. No abnormal movements. Skin:  . No rashes, lesions, ulcers . palpation of skin: no induration or nodules Psychiatric:  . judgement and insight appear normal . Mental status o Mood, affect appropriate  Wt Readings from Last 3 Encounters:  01/28/17 73 kg (161 lb)  01/15/17 73.3 kg (161 lb 9.6 oz)  01/09/17 72.2 kg (159 lb 3.2 oz)    I have personally reviewed following labs and imaging studies  Labs on Admission:  CBC:  Recent Labs Lab 01/28/17 1024  WBC 2.5*  NEUTROABS 1.0*  HGB 8.8*  HCT 27.6*  MCV 87.3  PLT 28*   Basic Metabolic Panel:  Recent Labs Lab 01/28/17 1024  NA 140  K 4.3  CL 106  CO2 26  GLUCOSE 119*  BUN 17  CREATININE 0.68  CALCIUM 9.3   Coagulation Profile:  Recent Labs Lab 01/28/17 1024  INR 1.40   Urine analysis:    Component Value Date/Time   COLORURINE YELLOW 12/19/2016 0825   APPEARANCEUR CLEAR 12/19/2016 0825   LABSPEC 1.021 12/19/2016 0825   PHURINE 7.0 12/19/2016 0825   GLUCOSEU NEGATIVE 12/19/2016 0825   HGBUR NEGATIVE 12/19/2016 0825   BILIRUBINUR NEGATIVE 12/19/2016 0825   KETONESUR NEGATIVE 12/19/2016 0825   PROTEINUR NEGATIVE 12/19/2016 0825   NITRITE NEGATIVE 12/19/2016 0825   LEUKOCYTESUR NEGATIVE 12/19/2016  0825   Radiological Exams on Admission: Dg Lumbar Spine Complete  Result Date: 01/28/2017 CLINICAL DATA:  Low back pain secondary to a twisting injury this morning. Known metastatic prostate cancer to bone. Non diffuse metastatic disease in the lumbar spine and pelvic bones. EXAM: LUMBAR SPINE - COMPLETE 4+ VIEW COMPARISON:  Radiographs dated 11/09/2016 and bone scan dated 12/16/2016 and MRI dated 11/09/2016 FINDINGS: There is inhomogeneous patchy density throughout the visualized portion of the skeleton consistent with metastatic prostate cancer. There is loss of the left pedicle of L5 consistent with a lytic lesion. There is no acute fracture. Calcification in the abdominal aorta. IMPRESSION: Extensive metastatic disease throughout the visualized portion of the skeleton with a lytic lesion of the left pedicle of L5. No acute abnormalities. Aortic atherosclerosis. Electronically Signed   By: Lorriane Shire M.D.   On: 01/28/2017 11:10    Principal Problem:   Thrombocytopenia ([place]) Active Problems:   Prostate cancer metastatic to bone (  [place])   Hepatitis C without hepatic coma   Symptomatic anemia   Epistaxis   Pancytopenia ([facility])   Assessment/Plan 1. Symptomatic anemia. No evidence of significant GI bleeding. Suspect multifactorial including epistaxis but also bone marrow process or low-grade DIC.  Transfuse PRBC 2. Epistaxis secondary to thrombocytopenia. No evidence of significant bleeding at this point. Not on any anticoagulants or antiplatelet agents.  Transfuse platelets 3. Pancytopenia. As noted above, oncologist thinks this is secondary to bone marrow involvement from prostate cancer, however low-grade DIC has been in the differential. Also consider hepatitis C. 4. Metastatic prostate cancer to the bone 5. Hepatitis C with transaminitis. Pain overall been no minimally elevated.   Records obtained from Dr. Marcheta Grammes office: March 1: WBC 4.9, hemoglobin 10.1, platelets 68.  In  discussion with Dr. Leilani Merl, he recommends the following: Check PTT, fibrinogen, d-dimer, CMP, transfuse platelets and repeat CBC in the morning.  Available for discussion again 3/15, Vertis Kelch will be able to discharge home with outpatient follow-up next week.   It is my clinical opinion that referral for OBSERVATION is reasonable and necessary in this patient  The aforementioned taken together are felt to place the patient at high risk for further clinical deterioration. However it is anticipated that the patient may be medically stable for discharge from the hospital within 24 to 48 hours.   DVT prophylaxis: none Code Status: full Family Communication: brother at bedside Consults called: as above    Time spent: 100 minutes, including discussion with oncologist as well as solicitation of records.  Murray Hodgkins, MD  [facility] Direct contact: [phone] --Via amion app OR  --[email or web address]; password [email or web address]  7PM-7AM contact night coverage as above  01/28/2017, 11:23 AM

## 2017-01-28 NOTE — ED Notes (Signed)
Pt assisted to bathroom to have bm, now in bed in room 324 on dept 300.  Nurse tech in room with pt at this time.

## 2017-01-28 NOTE — ED Notes (Signed)
Dr Goodrich at bedside

## 2017-01-28 NOTE — ED Notes (Addendum)
Pt left nare began bleeding slightly, Dr. Thurnell Garbe notified and instructed nurse to give spray of afrin in nare and to hold pressure.  Pt given afrin spray and is now holding pressure to bridge of nose. Dr. Sarajane Jews paged.

## 2017-01-28 NOTE — ED Notes (Signed)
Pt reports that nose has stopped bleeding.

## 2017-01-28 NOTE — ED Triage Notes (Signed)
Per EMS: Pt having nose bleed x3-4 hours out of left nare.  Pt took last radiation tx on Friday for prostate and bone cancer.  Pt alert and oriented at this time.  148/90, 68 pulse, 98% ra

## 2017-01-29 DIAGNOSIS — R040 Epistaxis: Secondary | ICD-10-CM | POA: Diagnosis not present

## 2017-01-29 DIAGNOSIS — C7951 Secondary malignant neoplasm of bone: Secondary | ICD-10-CM | POA: Diagnosis not present

## 2017-01-29 DIAGNOSIS — D689 Coagulation defect, unspecified: Secondary | ICD-10-CM

## 2017-01-29 DIAGNOSIS — D649 Anemia, unspecified: Secondary | ICD-10-CM | POA: Diagnosis not present

## 2017-01-29 DIAGNOSIS — C61 Malignant neoplasm of prostate: Secondary | ICD-10-CM

## 2017-01-29 DIAGNOSIS — D61818 Other pancytopenia: Secondary | ICD-10-CM | POA: Diagnosis not present

## 2017-01-29 DIAGNOSIS — K769 Liver disease, unspecified: Secondary | ICD-10-CM | POA: Diagnosis not present

## 2017-01-29 DIAGNOSIS — D696 Thrombocytopenia, unspecified: Secondary | ICD-10-CM | POA: Diagnosis not present

## 2017-01-29 LAB — PROTIME-INR
INR: 1.4
Prothrombin Time: 17.3 seconds — ABNORMAL HIGH (ref 11.4–15.2)

## 2017-01-29 LAB — CBC
HEMATOCRIT: 29.4 % — AB (ref 39.0–52.0)
HEMOGLOBIN: 9.7 g/dL — AB (ref 13.0–17.0)
MCH: 28.2 pg (ref 26.0–34.0)
MCHC: 33 g/dL (ref 30.0–36.0)
MCV: 85.5 fL (ref 78.0–100.0)
Platelets: 33 10*3/uL — ABNORMAL LOW (ref 150–400)
RBC: 3.44 MIL/uL — ABNORMAL LOW (ref 4.22–5.81)
RDW: 19 % — AB (ref 11.5–15.5)
WBC: 2.5 10*3/uL — AB (ref 4.0–10.5)

## 2017-01-29 LAB — PREPARE PLATELET PHERESIS: Unit division: 0

## 2017-01-29 LAB — APTT: APTT: 40 s — AB (ref 24–36)

## 2017-01-29 LAB — BPAM PLATELET PHERESIS
Blood Product Expiration Date: 201803162359
ISSUE DATE / TIME: 201803141630
Unit Type and Rh: 7300

## 2017-01-29 MED ORDER — OXYMETAZOLINE HCL 0.05 % NA SOLN
1.0000 | Freq: Two times a day (BID) | NASAL | Status: DC
  Administered 2017-01-29 – 2017-01-30 (×3): 1 via NASAL

## 2017-01-29 MED ORDER — BISACODYL 10 MG RE SUPP
10.0000 mg | Freq: Once | RECTAL | Status: AC
  Administered 2017-01-29: 10 mg via RECTAL
  Filled 2017-01-29: qty 1

## 2017-01-29 MED ORDER — ENSURE ENLIVE PO LIQD
237.0000 mL | Freq: Three times a day (TID) | ORAL | Status: DC
  Administered 2017-01-29 – 2017-01-30 (×2): 237 mL via ORAL

## 2017-01-29 NOTE — Evaluation (Signed)
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 Platelet count 28, referred for observation for thorough cytopenia with epistaxis and symptomatic anemia with generalized weakness.  Dx: symptomatic anemia.    Clinical Impression  Pt received in bed, and was agreeable to PT evaluation.  Pt states that he normally uses a cane in his apartment, and when he is in the kitchen, he uses his RW.  Pt states that a w/c would not be able to fit in his apartment because there is not enough space.  He reports being independent with dressing and bathing, and his brother comes by nearly every day to assist him with running errands and getting to the grocery store.  Pt ambulated 6f with RW and supervision/Mod (I).  He would benefit from HHPT to increase his strength and endurance, and may also benefit from a rollator walker, however he is 6'5" tall.  No further acute care PT needs at this time, will sign off.     Follow Up Recommendations Home health PT    Equipment Recommendations  Other (comment) (Rollator walker - he is 6'5" tall)    Recommendations for Other Services       Precautions / Restrictions Precautions Precautions: Fall Precaution Comments: Due to immobility - reason for admission.  Pt fell when attempting to get off his BSC and landed on his knees.  when he got up, he was having back pain.   Restrictions Weight Bearing Restrictions: No      Mobility  Bed Mobility Overal bed mobility: Modified Independent                Transfers Overall transfer level: Modified independent Equipment used: Rolling walker (2 wheeled)                Ambulation/Gait Ambulation/Gait  assistance: Supervision;Modified independent (Device/Increase time) Ambulation Distance (Feet): 40 Feet Assistive device: Rolling walker (2 wheeled) Gait Pattern/deviations: Narrow base of support   Gait velocity interpretation: <1.8 ft/sec, indicative of risk for recurrent falls General Gait Details: Pt states that occasionally his R knee gives out, and therefore he cannot go too far.    Stairs            Wheelchair Mobility    Modified Rankin (Stroke Patients Only)       Balance Overall balance assessment: History of Falls;Needs assistance Sitting-balance support: Bilateral upper extremity supported;Feet supported Sitting balance-Leahy Scale: Good     Standing balance support: Bilateral upper extremity supported Standing balance-Leahy Scale: Fair                               Pertinent Vitals/Pain Pain Assessment: 0-10 Pain Score: 7  Pain Location: waist, and lower back.   Pain Descriptors / Indicators: Aching;Burning;Sharp;Throbbing;Stabbing;Tingling Pain Intervention(s): Limited activity within patient's tolerance;Monitored during session;Repositioned    Home Living   Living Arrangements: Alone Available Help at Discharge: Available PRN/intermittently;Family (brother - comes by every day and will assist with running errands.  [facility] Type of Home: Apartment Home Access: Stairs to enter   Entrance Stairs-Number of Steps: 2 steps Home Layout: Two level;Able to live on main level with bedroom/bathroom Home Equipment: Bedside commode;Cane -  single point;Walker - 2 wheels Additional Comments: Pt states he is only able to use the RW in the kitchen.      Prior Function     Gait / Transfers Assistance Needed: Pt states that he can only mobilize for about 5-10 minutes and take only a few steps to do what he needs.  He uses the cane to mobilize.    ADL's / Homemaking Assistance Needed: independent with dressing and sponge  bathing.          Hand Dominance   Dominant Hand: Right    Extremity/Trunk Assessment   Upper Extremity Assessment Upper Extremity Assessment: Generalized weakness    Lower Extremity Assessment Lower Extremity Assessment: Generalized weakness    Cervical / Trunk Assessment Cervical / Trunk Assessment: Kyphotic  Communication   Communication: No difficulties  Cognition Arousal/Alertness: Awake/alert Behavior During Therapy: WFL for tasks assessed/performed Overall Cognitive Status: Within Functional Limits for tasks assessed                      General Comments      Exercises     Assessment/Plan    PT Assessment All further PT needs can be met in the next venue of care  PT Problem List Decreased strength;Decreased activity tolerance;Decreased balance;Decreased mobility       PT Treatment Interventions      PT Goals (Current goals can be found in the Care Plan section)  Acute Rehab PT Goals Patient Stated Goal: To go home and be independent with mobiltiy.  PT Goal Formulation: All assessment and education complete, DC therapy    Frequency     Barriers to discharge        Co-evaluation               End of Session Equipment Utilized During Treatment: Gait belt Activity Tolerance: Patient tolerated treatment well Patient left: in bed;with call bell/phone within reach Nurse Communication: Mobility status (mobility sheet left hanging in the room.  ) PT Visit Diagnosis: Muscle weakness (generalized) (M62.81);Other abnormalities of gait and mobility (R26.89);History of falling (Z91.81)    Functional Assessment Tool Used: AM-PAC 6 Clicks Basic Mobility;Clinical judgement Functional Limitation: Mobility: Walking and moving around Mobility: Walking and Moving Around Current Status (Z6109): At least 20 percent but less than 40 percent impaired, limited or restricted Mobility: Walking and Moving Around Goal Status [phone]): At least 20 percent but  less than 40 percent impaired, limited or restricted Mobility: Walking and Moving Around Discharge Status [phone]): At least 20 percent but less than 40 percent impaired, limited or restricted    Time: 1235-1300 PT Time Calculation (min) (ACUTE ONLY): 25 min   Charges:   PT Evaluation $PT Eval Low Complexity: 1 Procedure PT Treatments $Gait Training: 8-22 mins   PT G Codes:   PT G-Codes **NOT FOR INPATIENT CLASS** Functional Assessment Tool Used: AM-PAC 6 Clicks Basic Mobility;Clinical judgement Functional Limitation: Mobility: Walking and moving around Mobility: Walking and Moving Around Current Status (B1478): At least 20 percent but less than 40 percent impaired, limited or restricted Mobility: Walking and Moving Around Goal Status [phone]): At least 20 percent but less than 40 percent impaired, limited or restricted Mobility: Walking and Moving Around Discharge Status [phone]): At least 20 percent but less than 40 percent impaired, limited or restricted    Beth Benz [place], PT, DPT X: [phone]

## 2017-01-29 NOTE — Progress Notes (Signed)
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 Platelet count 28, referred for observation for thorough cytopenia with epistaxis and symptomatic anemia with generalized weakness.  Assessment/Plan 1. Symptomatic anemia, improved today status post transfusion. No epistaxis today. Thought to be secondary to bone marrow process secondary to prostate cancer, also in differential low-grade DIC. 2. Epistaxis secondary to thrombocytopenia. None today. No ongoing bleeding. Not on any anticoagulants or antiplatelet agents. Platelets modestly improved with transfusion. 3. Pancytopenia, discussed again with oncologist, thought to be bone marrow involvement. Excellent metastatic prostate cancer to the bone. 4. Hepatitis C with transaminitis. Follow-up as an outpatient.   Discussed with the patient's oncologist Dr. Leilani Merl again, reviewed all laboratory studies and clinical status. He feels this is most likely bone marrow involvement but he cannot rule out a developing process and recommends oncology consultation here. He is available by telephone as above for further discussion. If no new process and no further recommendations, he will follow-up with patient in the office next week.  Consult oncology, rationale as above  Anticipate discharge next 24 hours unless more urgent workup as needed.  DVT prophylaxis: none Code Status: full Family Communication: none Disposition Plan: home    Murray Hodgkins, MD  [facility] Direct contact: [phone] --[place] [place]  --[email or web address]; password [email or web address]  7PM-7AM contact night coverage as  above 01/29/2017, 11:56 AM  LOS: 0 days   Consultants:  Oncology by phone  Procedures:  Transfusion 2 units PRBC 3/14  Transfusion 1 unit platelets 3/14  Antimicrobials:    Interval history/Subjective: Feels stronger. Couple short episodes of epistaxis yesterday. None today.  Objective: Vitals:   01/28/17 2328 01/29/17 0010 01/29/17 0211 01/29/17 0653  BP: 124/77 114/74 128/73 129/74  Pulse: 96 (!) 103 98 99  Resp: 20 20 20 20   Temp: 98.6 F (37 C) 98.7 F (37.1 C) 98.5 F (36.9 C) 98.4 F (36.9 C)  TempSrc: Oral Oral Oral Oral  SpO2: 100% 100% 100% 100%  Weight:      Height:        Intake/Output Summary (Last 24 hours) at 01/29/17 1156 Last data filed at 01/29/17 0900  Gross per 24 hour  Intake             2142 ml  Output                0 ml  Net             2142 ml     Filed Weights   01/28/17 1000 01/28/17 1352  Weight: 73 kg (161 lb) 65.7 kg (144 lb 12.8 oz)    Exam:    Constitutional: Appears chronically ill but nontoxic  Respiratory clear to auscultation bilaterally. No wheezes, rales or rhonchi. Normal respiratory effort.   Cardiovascular regular rate and rhythm no murmur, rub or gallop. No lower extremity edema.  Psychiatric. Grossly normal mood and affect. Speech fluent and appropriate.   I have personally reviewed following labs and imaging studies:  Hemoglobin 9.7, platelet count 33, WBC 2.5  INR 1.4  APTT 40  D-dimer greater than 20  Fibrinogen 105  Scheduled Meds: . sodium chloride  10 mL/hr Intravenous Once  . sodium chloride   Intravenous Once  .  sodium chloride   Intravenous Once  . feeding supplement (ENSURE ENLIVE)  237 mL Oral BID BM  . oxyCODONE  15 mg Oral Q6H  . pantoprazole  40 mg Oral Daily  . sodium chloride flush  3 mL Intravenous Q12H   Continuous Infusions:  Principal Problem:   Thrombocytopenia ([place]) Active Problems:   Prostate cancer metastatic to bone (HCC)   Hepatitis C without hepatic coma    Symptomatic anemia   Epistaxis   Pancytopenia (HCC)   LOS: 0 days

## 2017-01-29 NOTE — Care Management (Signed)
    Durable Medical Equipment        Start     Ordered   01/29/17 1351  For home use only DME 4 wheeled rolling walker with seat  Once    Question:  Patient needs a walker to treat with the following condition  Answer:  Anemia   01/29/17 1351

## 2017-01-29 NOTE — Consult Note (Signed)
[facility] Consultation Oncology  Name: Ryan Ware      MRN: [id number]    Location: N989/Q119-41  Date: 01/29/2017 Time:4:19 PM   REFERRING PHYSICIAN: Dr. Sarajane Jews  REASON FOR CONSULT: Prostate CA with bone mets, pancytopenia   DIAGNOSIS:  Stage IV prostate CA with bone mets Chronic Pancytopenia Hepatitis C  HISTORY OF PRESENT ILLNESS:  66 year old male with a past medical history of stage IV prostate cancer with bone metastasis to the spine, chronic pancytopenia, hepatitis C presents for evaluation of epistaxis. On admission he was found to have a hemoglobin 8.8 g deciliter, platelet count 28K, WBC 2.5K. He was given platelets and PRBC transfusion. His platelet count is now up to 33K, hemoglobin 9.7 g/dL today. Baseline CBC from 12/21/16 demonstrated WBC 13.6k, hemoglobin 10.5 g/dL, plt 114k.  He follows with oncologist Dr. [name] in [place]. Dr. Sarajane Jews in the ED has spoken with his oncologist on the phone. 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 In discussion with Dr. Leilani Merl he recommended checking to rule patient for DIC, transfuse platelets, repeat CBC in the a.m.  Patient is a very poor historian. He does not remember the name of his oncologist or what treatments he is receiving for his prostate cancer. He complains of low back pain and generalized weakness. He has not had any more at epistaxis since being in the hospital. Denies any chest pain, shortness of pack, bone pain, recent infections, focal weakness. Otherwise 12 point ROS is negative.   ROS: Review of Systems  Constitutional: Negative for chills, fever and weight loss.  HENT: Negative.   Eyes: Negative.   Respiratory: Negative.   Cardiovascular: Negative.   Gastrointestinal: Negative.   Genitourinary: Negative.   Musculoskeletal: Positive for back pain.  Skin:  Negative.   Neurological: Positive for weakness. Negative for dizziness and headaches.  Endo/Heme/Allergies:       Epistaxis  Psychiatric/Behavioral: Negative.     PAST MEDICAL HISTORY:   Past Medical History:  Diagnosis Date  . Bone cancer ([place])    prostate ca with spine mets  . Hepatitis C   . Prostate cancer ([place])     ALLERGIES: Allergies  Allergen Reactions  . Acetaminophen Other (See Comments)    Liver problems      MEDICATIONS: I have reviewed the patient's current medications.     PAST SURGICAL HISTORY Past Surgical History:  Procedure Laterality Date  . HAND SURGERY    . PROSTATE BIOPSY      FAMILY HISTORY: Family History  Problem Relation Age of Onset  . Hypertension Mother   . Osteoarthritis Father   . Cancer Neg Hx     SOCIAL HISTORY:  reports that he has been smoking Cigarettes.  He has a 25.00 pack-year smoking history. He has never used smokeless tobacco. He reports that he does not drink alcohol or use drugs.  PERFORMANCE STATUS: The patient's performance status is 2 - Symptomatic, <50% confined to bed  PHYSICAL EXAM: Most Recent Vital Signs: Blood pressure 118/73, pulse (!) 104, temperature 98.9 F (37.2 C), temperature source Oral, resp. rate 20, height 6' 5"  (1.956 m), weight 144 lb 12.8 oz (65.7 kg), SpO2 97 %. Physical Exam  Constitutional: He is oriented to person, place, and time.  Cachectic male, NAD  HENT:  Head: Normocephalic and atraumatic.  Mouth/Throat: No oropharyngeal exudate.  Eyes: Conjunctivae are normal. Pupils are equal,  round, and reactive to light. No scleral icterus.  Neck: Normal range of motion. Neck supple.  Cardiovascular: Normal rate, regular rhythm and normal heart sounds.  Exam reveals no friction rub.   No murmur heard. Pulmonary/Chest: Effort normal and breath sounds normal. No respiratory distress. He has no wheezes.  Abdominal: Soft. Bowel sounds are normal. He exhibits no distension. There is no tenderness.   Musculoskeletal: He exhibits no edema, tenderness or deformity.  Lymphadenopathy:    He has no cervical adenopathy.  Neurological: He is alert and oriented to person, place, and time. No cranial nerve deficit.  Skin: Skin is warm and dry. No rash noted. No erythema.  Psychiatric: Mood and affect normal.    LABORATORY DATA:  Results for orders placed or performed during the hospital encounter of 01/28/17 (from the past 48 hour(s))  Basic metabolic panel     Status: Abnormal   Collection Time: 01/28/17 10:24 AM  Result Value Ref Range   Sodium 140 135 - 145 mmol/L   Potassium 4.3 3.5 - 5.1 mmol/L   Chloride 106 101 - 111 mmol/L   CO2 26 22 - 32 mmol/L   Glucose, Bld 119 (H) 65 - 99 mg/dL   BUN 17 6 - 20 mg/dL   Creatinine, Ser 0.68 0.61 - 1.24 mg/dL   Calcium 9.3 8.9 - 10.3 mg/dL   GFR calc non Af Amer >60 >60 mL/min   GFR calc Af Amer >60 >60 mL/min    Comment: (NOTE) The eGFR has been calculated using the CKD EPI equation. This calculation has not been validated in all clinical situations. eGFR's persistently <60 mL/min signify possible Chronic Kidney Disease.    Anion gap 8 5 - 15  CBC with Differential     Status: Abnormal   Collection Time: 01/28/17 10:24 AM  Result Value Ref Range   WBC 2.5 (L) 4.0 - 10.5 K/uL   RBC 3.16 (L) 4.22 - 5.81 MIL/uL   Hemoglobin 8.8 (L) 13.0 - 17.0 g/dL   HCT 27.6 (L) 39.0 - 52.0 %   MCV 87.3 78.0 - 100.0 fL   MCH 27.8 26.0 - 34.0 pg   MCHC 31.9 30.0 - 36.0 g/dL   RDW 20.7 (H) 11.5 - 15.5 %   Platelets 28 (LL) 150 - 400 K/uL    Comment: REPEATED TO VERIFY CRITICAL RESULT CALLED TO, READ BACK BY AND VERIFIED WITH: SANDRA BETHEL RN AT 1040 BY HFLYNT 01/28/17    Neutrophils Relative % 42 %   Lymphocytes Relative 41 %   Monocytes Relative 13 %   Eosinophils Relative 2 %   Basophils Relative 2 %   Neutro Abs 1.0 (L) 1.7 - 7.7 K/uL   Lymphs Abs 1.0 0.7 - 4.0 K/uL   Monocytes Absolute 0.3 0.1 - 1.0 K/uL   Eosinophils Absolute 0.1 0.0 -  0.7 K/uL   Basophils Absolute 0.1 0.0 - 0.1 K/uL   Smear Review PLATELET COUNT CONFIRMED BY SMEAR   Protime-INR     Status: Abnormal   Collection Time: 01/28/17 10:24 AM  Result Value Ref Range   Prothrombin Time 17.3 (H) 11.4 - 15.2 seconds   INR 1.40   Hepatic function panel     Status: Abnormal   Collection Time: 01/28/17 10:24 AM  Result Value Ref Range   Total Protein 6.0 (L) 6.5 - 8.1 g/dL   Albumin 2.9 (L) 3.5 - 5.0 g/dL   AST 249 (H) 15 - 41 U/L   ALT 180 (H) 17 -  63 U/L   Alkaline Phosphatase 224 (H) 38 - 126 U/L   Total Bilirubin 1.4 (H) 0.3 - 1.2 mg/dL   Bilirubin, Direct 0.5 0.1 - 0.5 mg/dL   Indirect Bilirubin 0.9 0.3 - 0.9 mg/dL  Fibrinogen     Status: Abnormal   Collection Time: 01/28/17 10:24 AM  Result Value Ref Range   Fibrinogen 105 (L) 210 - 475 mg/dL  D-dimer, quantitative (not at [facility])     Status: Abnormal   Collection Time: 01/28/17 10:24 AM  Result Value Ref Range   D-Dimer, Quant >20.00 (H) 0.00 - 0.50 ug/mL-FEU    Comment: (NOTE) At the manufacturer cut-off of 0.50 ug/mL FEU, this assay has been documented to exclude PE with a sensitivity and negative predictive value of 97 to 99%.  At this time, this assay has not been approved by the FDA to exclude DVT/VTE. Results should be correlated with clinical presentation.   APTT     Status: Abnormal   Collection Time: 01/28/17 10:24 AM  Result Value Ref Range   aPTT 41 (H) 24 - 36 seconds    Comment:        IF BASELINE aPTT IS ELEVATED, SUGGEST PATIENT RISK ASSESSMENT BE USED TO DETERMINE APPROPRIATE ANTICOAGULANT THERAPY.   Prepare Pheresed Platelets     Status: None   Collection Time: 01/28/17 10:24 AM  Result Value Ref Range   Unit Number [id number]    Blood Component Type PLTP LR1 PAS    Unit division 00    Status of Unit ISSUED,FINAL    Transfusion Status OK TO TRANSFUSE   Type and screen     Status: None   Collection Time: 01/28/17 11:10 AM  Result Value Ref Range   ABO/RH(D) B POS     Antibody Screen NEG    Sample Expiration 01/31/2017    Unit Number [id number]    Blood Component Type RED CELLS,LR    Unit division 00    Status of Unit ISSUED,FINAL    Transfusion Status OK TO TRANSFUSE    Crossmatch Result Compatible    Unit Number [id number]    Blood Component Type RED CELLS,LR    Unit division 00    Status of Unit ISSUED,FINAL    Transfusion Status OK TO TRANSFUSE    Crossmatch Result Compatible   ABO/Rh     Status: None   Collection Time: 01/28/17 11:10 AM  Result Value Ref Range   ABO/RH(D) B POS   Prepare RBC     Status: None   Collection Time: 01/28/17 11:49 AM  Result Value Ref Range   Order Confirmation ORDER PROCESSED BY BLOOD BANK   CBC     Status: Abnormal   Collection Time: 01/29/17  4:43 AM  Result Value Ref Range   WBC 2.5 (L) 4.0 - 10.5 K/uL   RBC 3.44 (L) 4.22 - 5.81 MIL/uL   Hemoglobin 9.7 (L) 13.0 - 17.0 g/dL   HCT 29.4 (L) 39.0 - 52.0 %   MCV 85.5 78.0 - 100.0 fL   MCH 28.2 26.0 - 34.0 pg   MCHC 33.0 30.0 - 36.0 g/dL   RDW 19.0 (H) 11.5 - 15.5 %   Platelets 33 (L) 150 - 400 K/uL    Comment: SPECIMEN CHECKED FOR CLOTS PLATELET COUNT CONFIRMED BY SMEAR   Protime-INR     Status: Abnormal   Collection Time: 01/29/17  4:43 AM  Result Value Ref Range   Prothrombin Time 17.3 (H) 11.4 - 15.2 seconds  INR 1.40   APTT     Status: Abnormal   Collection Time: 01/29/17  4:43 AM  Result Value Ref Range   aPTT 40 (H) 24 - 36 seconds    Comment:        IF BASELINE aPTT IS ELEVATED, SUGGEST PATIENT RISK ASSESSMENT BE USED TO DETERMINE APPROPRIATE ANTICOAGULANT THERAPY.       RADIOGRAPHY: Dg Lumbar Spine Complete  Result Date: 01/28/2017 CLINICAL DATA:  Low back pain secondary to a twisting injury this morning. Known metastatic prostate cancer to bone. Non diffuse metastatic disease in the lumbar spine and pelvic bones. EXAM: LUMBAR SPINE - COMPLETE 4+ VIEW COMPARISON:  Radiographs dated 11/09/2016 and bone scan dated 12/16/2016  and MRI dated 11/09/2016 FINDINGS: There is inhomogeneous patchy density throughout the visualized portion of the skeleton consistent with metastatic prostate cancer. There is loss of the left pedicle of L5 consistent with a lytic lesion. There is no acute fracture. Calcification in the abdominal aorta. IMPRESSION: Extensive metastatic disease throughout the visualized portion of the skeleton with a lytic lesion of the left pedicle of L5. No acute abnormalities. Aortic atherosclerosis. Electronically Signed   By: Lorriane Shire M.D.   On: 01/28/2017 11:10       ASSESSMENT: 1. Epistaxis secondary to thrombocytopenia 2. Chronic pancytopenia likely multifactorial from possible bone marrow involvement by metastatic prostate CA, hepatitis C with splenic sequestration. 3. Worsening transaminitis 4. Coagulopathy likely due to underlying liver disease  PLAN:  - I would not transfuse patient platelets again unless he has active bleeding or platelet count <15k. Repeat CBC, CMP in am.  - Patient follows up with Dr. Leilani Merl at the [facility] in [place] for his prostate CA. Recommend for patient to follow up with him upon discharge. No bone marrow biopsy as an inpatient. His bone marrow biopsy can be done as an outpatient by his oncologist. He had normal iron levels and folate levels last month. Can check B12 level and supplement as needed. - I suspect that he has poorly controlled prostate CA. Check PSA level. He will need to follow up with Dr. Leilani Merl if there is any changes to be made to his cancer management. - Recommend GI consult for worsening transaminitis. - Recommend PT consult for safety assessment since patient states he has generalized weakness. - Please call with any questions.    All questions were answered. The patient knows to call the clinic with any problems, questions or concerns. We can certainly see the patient much sooner if necessary.    Twana First, MD Medical Oncology

## 2017-01-29 NOTE — Progress Notes (Signed)
Initial Nutrition Assessment  DOCUMENTATION CODES:  Underweight, Severe malnutrition in context of chronic illness   Pt meets criteria for SEVERE MALNUTRITION in the context of Chronic illness as evidenced by loss of nearly 1/3 of his body weight in 1 year (33%) and severe muscle/fat wasting.  INTERVENTION:  Ensure Enlive po TID, each supplement provides 350 kcal and 20 grams of protein  Magic cup TID with meals, each supplement provides 290 kcal and 9 grams of protein  Would recommend beginning bowel regimen ; pt reports having trouble with constipation and needing softener at baseline.   Pt appears to be declining rapidly, potentially consider palliative consult if felt appropriate  NUTRITION DIAGNOSIS:  Inadequate oral intake related to poor appetite, dysphagia, cancer and cancer related treatments as evidenced by loss of nearly 70 lbs in 1 year, severe depletion of muscle mass, severe depletion of body fat.  GOAL:  Patient will meet greater than or equal to 90% of their needs  MONITOR:  PO intake, Supplement acceptance, Labs  REASON FOR ASSESSMENT:  Malnutrition Screening Tool    ASSESSMENT:  66 y/o male PMHx Prostate Cancer w/ Bone metastases, anemia, thrombocytopenia, leukopenia, Hep C. Presented to ED with several days of nose bleeds. Admitted for symptomatic anemia and thrombocytopenia  Encounter from this morning.   Pt reports that he was diagnosed with prostate cancer roughly 1 year ago. Per chart review, he appears to have presented March 2017 with back pain and found to have the metastatic lesions.   On initial nursing admission questions, pt reported being on a puree diet. The patient says that he has had some significant dysphagia that was related to his cancer treatments? He says this is resolving. He still has trouble chewing due to minimal teeth, but his swallowing is improving.   He says he is eating 3 meals a day at home; potentially these are smaller. He  comments he takes a long time to eat now due to his trouble chewing/swallowing. He also drinks Boost Supplements. He takes a MVI.   His home eating habits do not sound to be ideal, as he says he mostly eats vegetables and stays away from higher protein sources as they are harder to chew. Did not object to this as, At this point, with his severe decline, feel patient should eat what he prefers as it will not change long term outcome.   Denies any recent n/v/d. He does have chronic constipation, but this too has improved recently. He was taking a stool softener daily to manage this.   He says his UBW all his life was 215-220 lbs. Per chart review, he appears to have been 211 lbs March 2017, when he was found to have the lesions. He has lost nearly 70 lbs in the last year or 1/3 of his body weight in the last year and 15 lbs in the last month  Physical exam. Displays marked severe fat/muscle wasting  Labs: Albumin: 2.9, Elevated liver enzymes/alk phos Medications: Ensure Enlive, IVF, PPI, Oxycodone   Recent Labs Lab 01/28/17 1024  NA 140  K 4.3  CL 106  CO2 26  BUN 17  CREATININE 0.68  CALCIUM 9.3  GLUCOSE 119*   Diet Order:  Diet regular Room service appropriate? Yes; Fluid consistency: Thin  Skin:  Reviewed, no issues  Last BM:  3/14  Height:  Ht Readings from Last 1 Encounters:  01/28/17 _0  (1.956 m)   Weight:  Wt Readings from Last 1 Encounters:  01/28/17  144 lb 12.8 oz (65.7 kg)   Wt Readings from Last 10 Encounters:  01/28/17 144 lb 12.8 oz (65.7 kg)  01/15/17 161 lb 9.6 oz (73.3 kg)  01/09/17 159 lb 3.2 oz (72.2 kg)  01/05/17 159 lb (72.1 kg)  11/14/16 200 lb (90.7 kg)  02/21/16 210 lb (95.3 kg)  01/28/16 211 lb 1 oz (95.7 kg)  11/14/14 230 lb (104.3 kg)   Ideal Body Weight:  94.54 kg  BMI:  Body mass index is 17.17 kg/m.  Estimated Nutritional Needs:  Kcal:  2300-2500 (35-38 kcal/kg bw) Protein:  90-110 g (1.4-1.7g/kg bw) Fluid:  >2 Liters (30 ml/kg  bw)  EDUCATION NEEDS:  No education needs identified at this time  Burtis Junes RD, LDN, [place] [facility] Pager: [phone] 01/29/2017 5:27 PM

## 2017-01-30 DIAGNOSIS — D649 Anemia, unspecified: Secondary | ICD-10-CM | POA: Diagnosis not present

## 2017-01-30 DIAGNOSIS — R040 Epistaxis: Secondary | ICD-10-CM | POA: Diagnosis not present

## 2017-01-30 DIAGNOSIS — D696 Thrombocytopenia, unspecified: Secondary | ICD-10-CM | POA: Diagnosis not present

## 2017-01-30 DIAGNOSIS — B182 Chronic viral hepatitis C: Secondary | ICD-10-CM | POA: Diagnosis not present

## 2017-01-30 LAB — PREPARE RBC (CROSSMATCH)

## 2017-01-30 LAB — CBC
HCT: 28 % — ABNORMAL LOW (ref 39.0–52.0)
HEMOGLOBIN: 9 g/dL — AB (ref 13.0–17.0)
MCH: 28 pg (ref 26.0–34.0)
MCHC: 32.1 g/dL (ref 30.0–36.0)
MCV: 87 fL (ref 78.0–100.0)
PLATELETS: 32 10*3/uL — AB (ref 150–400)
RBC: 3.22 MIL/uL — ABNORMAL LOW (ref 4.22–5.81)
RDW: 18.7 % — ABNORMAL HIGH (ref 11.5–15.5)
WBC: 2.3 10*3/uL — ABNORMAL LOW (ref 4.0–10.5)

## 2017-01-30 LAB — HIV ANTIBODY (ROUTINE TESTING W REFLEX): HIV Screen 4th Generation wRfx: NONREACTIVE

## 2017-01-30 LAB — GLUCOSE, CAPILLARY: GLUCOSE-CAPILLARY: 83 mg/dL (ref 65–99)

## 2017-01-30 MED ORDER — OXYMETAZOLINE HCL 0.05 % NA SOLN: 1.0000 | Freq: Two times a day (BID) | NASAL | 0 refills | Status: DC | PRN

## 2017-01-30 MED ORDER — SODIUM CHLORIDE 0.9 % IV SOLN: Freq: Once | INTRAVENOUS | Status: DC

## 2017-01-30 MED ORDER — PANTOPRAZOLE SODIUM 40 MG PO TBEC: 40.0000 mg | DELAYED_RELEASE_TABLET | Freq: Every day | ORAL | 0 refills | Status: DC

## 2017-01-30 NOTE — Discharge Summary (Signed)
Physician Discharge Summary  Ryan Ware:[id number] DOB: 12-Dec-1950 DOA: 01/28/2017  PCP: [facility] date: 01/28/2017 Discharge date: 01/30/2017  Recommendations for Outpatient Follow-up:  1. Follow-up pancytopenia, epistaxis, elevated LFTs 2. Xtandi on hold per Dr. Marcheta Grammes request 3. Physical therapy home health is being pursued through the [place].    Follow-up Information    Valarie Merino McCoy,MD Follow up.   Why:  office will contact you with appointment for next week         Discharge Diagnoses:  1. Symptomatic anemia 2. Epistaxis secondary to thrombocytopenia 3. Pancytopenia 4. Metastatic prostate cancer 5. Hepatitis C with transaminitis  Discharge Condition: Improved Disposition: Home  Diet recommendation: Regular  Filed Weights   01/28/17 1000 01/28/17 1352  Weight: 73 kg (161 lb) 65.7 kg (144 lb 12.8 oz)    History of present illness:  66 year old man PMH prostate cancer with metastatic disease to the spine, anemia, thrombocytopenia, leukopenia who presented to the emergency department with several-day history of recurrent epistaxis. Platelet count 28, referred for observation for thrombocytopenia with epistaxis and symptomatic anemia with generalized weakness.  Hospital Course:  Patient was observed, transfused a total of 3 units packed red blood cells and 1 unit of platelets. Epistaxis resolved. Platelet count stabilized. Symptoms improved. Case was discussed in detail with the patient's oncologist and pancytopenia is thought to be secondary to bone marrow involvement from metastatic prostate cancer. Lab results were reviewed with the patient's oncologist including d-dimer, fibrinogen and CBC. On his advice, hematology oncology was consulted at this campus, with recommendation to follow-up as an outpatient. Patient will follow up with his primary oncologist next week. Hospitalization was uncomplicated.  1. Symptomatic anemia, continuous to  improve although still somewhat weak. Multifactorial including bone marrow process secondary to prostate cancer, as well as epistaxis.  Transfuse 1 additional unit PRBC 2. Epistaxis secondary to thrombocytopenia. None today.   Afrin as needed. 3. 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 4. Hepatitis C with transaminitis. Follow-up as an outpatient.  Consultants:  Oncology   Procedures:  Transfusion 2 units PRBC 3/14  Transfusion 1 unit platelets 3/14  Transfusion one unit PRBC 3/15  Discharge Instructions  Discharge Instructions    Diet general    Complete by:  As directed    Discharge instructions    Complete by:  As directed    Call your physician or seek immediate medical attention for bleeding, weakness, falls, bruising, rash, pain, shortness of breath or worsening of condition.   Increase activity slowly    Complete by:  As directed      Allergies as of 01/30/2017      Reactions   Acetaminophen Other (See Comments)   Liver problems      Medication List    STOP taking these medications   dexamethasone 1 MG tablet Commonly known as:  DECADRON   enzalutamide 40 MG capsule Commonly known as:  XTANDI   omeprazole 20 MG capsule Commonly known as:  PRILOSEC Replaced by:  pantoprazole 40 MG tablet     TAKE these medications   degarelix 120 MG injection Commonly known as:  FIRMAGON Inject 240 mg into the skin once.   denosumab 60 MG/ML Soln injection Commonly known as:  PROLIA Inject 60 mg into the skin every 6 (six) months. Administer in upper arm, thigh, or abdomen   docusate sodium 100 MG capsule Commonly known as:  COLACE Take 100 mg by mouth 2 (two) times daily.  multivitamin with minerals Tabs tablet Take 1 tablet by mouth daily.   Oxycodone HCl 10 MG Tabs Take 15 mg by mouth every 6 (six) hours.   oxymetazoline 0.05 % nasal spray Commonly known as:  AFRIN Place 1 spray into both nostrils 2 (two) times daily  as needed for congestion (epistaxis).   pantoprazole 40 MG tablet Commonly known as:  PROTONIX Take 1 tablet (40 mg total) by mouth daily. Start taking on:  01/31/2017 Replaces:  omeprazole 20 MG capsule   polyethylene glycol packet Commonly known as:  MIRALAX / GLYCOLAX Take 17 g by mouth daily as needed.            Durable Medical Equipment        Start     Ordered   01/29/17 1351  For home use only DME 4 wheeled rolling walker with seat  Once    Question:  Patient needs a walker to treat with the following condition  Answer:  Anemia   01/29/17 1351     Allergies  Allergen Reactions  . Acetaminophen Other (See Comments)    Liver problems    The results of significant diagnostics from this hospitalization (including imaging, microbiology, ancillary and laboratory) are listed below for reference.    Significant Diagnostic Studies: Dg Lumbar Spine Complete  Result Date: 01/28/2017 CLINICAL DATA:  Low back pain secondary to a twisting injury this morning. Known metastatic prostate cancer to bone. Non diffuse metastatic disease in the lumbar spine and pelvic bones. EXAM: LUMBAR SPINE - COMPLETE 4+ VIEW COMPARISON:  Radiographs dated 11/09/2016 and bone scan dated 12/16/2016 and MRI dated 11/09/2016 FINDINGS: There is inhomogeneous patchy density throughout the visualized portion of the skeleton consistent with metastatic prostate cancer. There is loss of the left pedicle of L5 consistent with a lytic lesion. There is no acute fracture. Calcification in the abdominal aorta. IMPRESSION: Extensive metastatic disease throughout the visualized portion of the skeleton with a lytic lesion of the left pedicle of L5. No acute abnormalities. Aortic atherosclerosis. Electronically Signed   By: Lorriane Shire M.D.   On: 01/28/2017 11:10    Labs: Basic Metabolic Panel:  Recent Labs Lab 01/28/17 1024  NA 140  K 4.3  CL 106  CO2 26  GLUCOSE 119*  BUN 17  CREATININE 0.68  CALCIUM  9.3   Liver Function Tests:  Recent Labs Lab 01/28/17 1024  AST 249*  ALT 180*  ALKPHOS 224*  BILITOT 1.4*  PROT 6.0*  ALBUMIN 2.9*   CBC:  Recent Labs Lab 01/28/17 1024 01/29/17 0443 01/30/17 0454  WBC 2.5* 2.5* 2.3*  NEUTROABS 1.0*  --   --   HGB 8.8* 9.7* 9.0*  HCT 27.6* 29.4* 28.0*  MCV 87.3 85.5 87.0  PLT 28* 33* 32*    CBG:  Recent Labs Lab 01/30/17 0812  GLUCAP 83    Principal Problem:   Thrombocytopenia ([place]) Active Problems:   Prostate cancer metastatic to bone (HCC)   Hepatitis C without hepatic coma   Symptomatic anemia   Epistaxis   Pancytopenia ([place])   Time coordinating discharge: 35 minutes  Signed:  Murray Hodgkins, MD [facility] 01/30/2017, 1:03 PM

## 2017-01-30 NOTE — Progress Notes (Signed)
PROGRESS NOTE  Ryan Ware DTO:[id number] DOB: 03-26-1951 DOA: 01/28/2017 PCP: [name] Oncologist [name], MD cell [phone], pager [phone]  Brief Narrative: 66 year old man PMH prostate cancer with metastatic disease to the spine, anemia, thrombocytopenia, leukopenia who presented to the emergency department with several-day history of recurrent epistaxis. Platelet count 28, referred for observation for thorough cytopenia with epistaxis and symptomatic anemia with generalized weakness.  Assessment/Plan 1. Symptomatic anemia, continuous to improve although still somewhat weak. Multifactorial including bone marrow process secondary to prostate cancer, as well as epistaxis.  Transfuse 1 additional unit PRBC 2. Epistaxis secondary to thrombocytopenia. None today.   Afrin as needed. 3. 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 4. Hepatitis C with transaminitis. Follow-up as an outpatient.   Discussed again today with Dr. Leilani Merl. He requested patient hold Xtandi until follow-up. He will arrange outpatient follow-up next week for the patient through his nurse.   Discharge home today. Home health PT recommended, case management working with [place].   Murray Hodgkins, MD  [facility] Direct contact: [phone] --Via amion app OR  --[email or web address]; password [email or web address]  7PM-7AM contact night coverage as above 01/30/2017, 12:57 PM  LOS: 0 days   Consultants:  Oncology by phone  Procedures:  Transfusion 2 units PRBC 3/14  Transfusion 1 unit platelets 3/14  Transfusion one unit PRBC 3/15  Antimicrobials:    Interval history/Subjective: Feeling better. Breathing epistaxis yesterday. No bleeding today.  Objective: Vitals:   01/29/17 2100 01/30/17 0431 01/30/17 1047 01/30/17 1117  BP: 122/70 119/77 127/75 117/74  Pulse: (!) 107 97 (!) 104 (!) 102  Resp: (!) 21 (!) 22 18 18   Temp: 98 F (36.7 C) 98.9 F  (37.2 C) 98.2 F (36.8 C) 99.2 F (37.3 C)  TempSrc: Oral Oral Oral Oral  SpO2: 100% 100% 100% 98%  Weight:      Height:        Intake/Output Summary (Last 24 hours) at 01/30/17 1257 Last data filed at 01/29/17 1700  Gross per 24 hour  Intake              240 ml  Output                0 ml  Net              240 ml     Filed Weights   01/28/17 1000 01/28/17 1352  Weight: 73 kg (161 lb) 65.7 kg (144 lb 12.8 oz)    Exam:    Constitutional: Appears calm, comfortable.  Respiratory clear to auscultation bilaterally. No wheezes, rales or rhonchi. Normal respiratory effort  Cardiovascular: Regular rate and rhythm. No murmur, rub or gallop.  Psychiatric: Grossly normal mood and affect. Speech fluent and appropriate.   I have personally reviewed following labs and imaging studies:  Hemoglobin 9.0, WBC 2.3, platelet count stable, 32  Scheduled Meds: . sodium chloride  10 mL/hr Intravenous Once  . sodium chloride   Intravenous Once  . sodium chloride   Intravenous Once  . sodium chloride   Intravenous Once  . feeding supplement (ENSURE ENLIVE)  237 mL Oral TID BM  . oxyCODONE  15 mg Oral Q6H  . oxymetazoline  1 spray Each Nare BID  . pantoprazole  40 mg Oral Daily  . sodium chloride flush  3 mL Intravenous Q12H   Continuous Infusions:  Principal Problem:   Thrombocytopenia ([place]) Active Problems:   Prostate cancer metastatic to bone (HCC)   Hepatitis  C without hepatic coma   Symptomatic anemia   Epistaxis   Pancytopenia (HCC)   LOS: 0 days

## 2017-01-30 NOTE — Care Management Note (Signed)
Case Management Note  Patient Details  Name: Ryan Ware MRN: [id number] Date of Birth: December 06, 1950  Subjective/Objective:                  Pt from home, lives alone and has brother for support. [facility] to [facility] for PCP and onc care. Pt's BCBS is primary. [facility] aware [facility]. Pt has had multiple falls and PT has recommended HH PT. Pt reports the [facility] recently referring [facility] for [facility] services and was contacted by agency (he cant remember name) but told them to wait because he was too weak. Pt does not want services arranged through his BCBS due to co-pays. [facility] contacted and said he was placed in the home maker aid program and referral was sent to [facility] reports no referral has been received. Pt needs rollator for support with ambulation. Romualdo Bolk, of [facility], per pt choice, made aware of referral, obtained pt info from chart and delivered rollator to pt room.   Action/Plan: CM has left VM for SW at [facility] [place] [phone] ext: [phone]. Pt discharging home today with self care until services can be arranged through [place].  Expected Discharge Date:  01/30/17               Expected Discharge Plan:  Home/Self Care  In-House Referral:  NA  Discharge planning Services  CM Consult  Post Acute Care Choice:  NA Choice offered to:  NA  Status of Service:  Completed, signed off  Sherald Barge, RN 01/30/2017, 1:07 PM

## 2017-01-30 NOTE — Progress Notes (Signed)
Patient is to be discharged home and in stable condition. IV removed. Patient discharge summary was faxed to [facility] Hematology and Oncology as Dr.McCoy requested. Patient aware that office will call with a follow-up appointment. Discharge instructions discussed with patient, to which he verbalized understanding of changes to medication regimen. Patient aware that he is to hold Xtandi until follow-up appointments, per MD order. Patient verbalizes understanding and has no further questions or concerns at this time. Patient will be escorted out by staff.  Celestia Khat, RN

## 2017-01-30 NOTE — Progress Notes (Addendum)
Upon administration of scheduled medication, RN noticed pill left on the patient's bedside table. RN compared the pill, suspecting the pill is oxycodone. Pharmacy notified, RN instructed to waste pill in the sharps in the presence of another RN. Medication wasted with Janan Ridge, RN.  [employer], RN   Verified waste of Oxycodone with Barbra Sarks, RN. Signed by Janan Ridge, RN

## 2017-01-31 LAB — BPAM RBC
Blood Product Expiration Date: 201803282359
Blood Product Expiration Date: 201804062359
Blood Product Expiration Date: 201804172359
ISSUE DATE / TIME: 201803161049
ISSUE DATE / TIME: 201803141228
ISSUE DATE / TIME: 201803142339
Unit Type and Rh: 1700
Unit Type and Rh: 1700
Unit Type and Rh: 1700

## 2017-01-31 LAB — TYPE AND SCREEN
ABO/RH(D): B POS
Antibody Screen: NEGATIVE
UNIT DIVISION: 0
Unit division: 0
Unit division: 0

## 2017-02-09 ENCOUNTER — Telehealth: Payer: Self-pay | Admitting: *Deleted

## 2017-02-09 NOTE — Telephone Encounter (Signed)
On 02-09-17 [facility], it was consult note

## 2017-02-24 ENCOUNTER — Inpatient Hospital Stay
Admission: RE | Admit: 2017-02-24 | Discharge: 2017-02-24 | Disposition: A | Discharge status: Home / Self Care | Payer: Self-pay | Source: Ambulatory Visit | Attending: Radiation Oncology | Admitting: Radiation Oncology

## 2017-02-24 ENCOUNTER — Encounter: Payer: Self-pay | Admitting: *Deleted

## 2017-02-24 DIAGNOSIS — C7951 Secondary malignant neoplasm of bone: Principal | ICD-10-CM

## 2017-02-24 DIAGNOSIS — C61 Malignant neoplasm of prostate: Secondary | ICD-10-CM

## 2017-02-24 NOTE — Progress Notes (Signed)
[place] unable to reach or leave a message at his home telephone number. [place] brother Houston Zapien spoke with him and he stated he was not aware of the 1400 follow up appointment for today.  He will call and reschedule the appointment.

## 2017-02-25 ENCOUNTER — Encounter (HOSPITAL_COMMUNITY): Payer: Self-pay | Admitting: Emergency Medicine

## 2017-02-25 ENCOUNTER — Emergency Department (HOSPITAL_COMMUNITY): Payer: BLUE CROSS/BLUE SHIELD

## 2017-02-25 ENCOUNTER — Inpatient Hospital Stay (HOSPITAL_COMMUNITY)
Admission: EM | Admit: 2017-02-25 | Discharge: 2017-02-27 | DRG: 871 | Disposition: A | Discharge status: Home Health | Payer: BLUE CROSS/BLUE SHIELD | Attending: Internal Medicine | Admitting: Internal Medicine

## 2017-02-25 DIAGNOSIS — D696 Thrombocytopenia, unspecified: Secondary | ICD-10-CM | POA: Diagnosis present

## 2017-02-25 DIAGNOSIS — A419 Sepsis, unspecified organism: Secondary | ICD-10-CM | POA: Diagnosis present

## 2017-02-25 DIAGNOSIS — R509 Fever, unspecified: Secondary | ICD-10-CM

## 2017-02-25 DIAGNOSIS — Z66 Do not resuscitate: Secondary | ICD-10-CM | POA: Diagnosis present

## 2017-02-25 DIAGNOSIS — D61818 Other pancytopenia: Secondary | ICD-10-CM | POA: Diagnosis present

## 2017-02-25 DIAGNOSIS — R945 Abnormal results of liver function studies: Secondary | ICD-10-CM

## 2017-02-25 DIAGNOSIS — Z681 Body mass index (BMI) 19 or less, adult: Secondary | ICD-10-CM | POA: Diagnosis not present

## 2017-02-25 DIAGNOSIS — F17210 Nicotine dependence, cigarettes, uncomplicated: Secondary | ICD-10-CM | POA: Diagnosis present

## 2017-02-25 DIAGNOSIS — M545 Low back pain, unspecified: Secondary | ICD-10-CM

## 2017-02-25 DIAGNOSIS — E43 Unspecified severe protein-calorie malnutrition: Secondary | ICD-10-CM | POA: Diagnosis not present

## 2017-02-25 DIAGNOSIS — C61 Malignant neoplasm of prostate: Secondary | ICD-10-CM | POA: Diagnosis not present

## 2017-02-25 DIAGNOSIS — C7951 Secondary malignant neoplasm of bone: Secondary | ICD-10-CM | POA: Diagnosis present

## 2017-02-25 DIAGNOSIS — D649 Anemia, unspecified: Secondary | ICD-10-CM

## 2017-02-25 DIAGNOSIS — R7989 Other specified abnormal findings of blood chemistry: Secondary | ICD-10-CM | POA: Diagnosis not present

## 2017-02-25 DIAGNOSIS — Z888 Allergy status to other drugs, medicaments and biological substances status: Secondary | ICD-10-CM

## 2017-02-25 DIAGNOSIS — Z79899 Other long term (current) drug therapy: Secondary | ICD-10-CM | POA: Diagnosis not present

## 2017-02-25 DIAGNOSIS — G8929 Other chronic pain: Secondary | ICD-10-CM | POA: Diagnosis not present

## 2017-02-25 HISTORY — DX: Thrombocytopenia, unspecified: D69.6

## 2017-02-25 HISTORY — DX: Other specified abnormal findings of blood chemistry: R79.89

## 2017-02-25 HISTORY — DX: Anemia, unspecified: D64.9

## 2017-02-25 HISTORY — DX: Abnormal results of liver function studies: R94.5

## 2017-02-25 LAB — INFLUENZA PANEL BY PCR (TYPE A & B)
INFLAPCR: NEGATIVE
INFLBPCR: NEGATIVE

## 2017-02-25 LAB — CBC WITH DIFFERENTIAL/PLATELET
BASOS ABS: 0.2 10*3/uL — AB (ref 0.0–0.1)
BASOS PCT: 2 %
Eosinophils Absolute: 0 10*3/uL (ref 0.0–0.7)
Eosinophils Relative: 0 %
HCT: 33.3 % — ABNORMAL LOW (ref 39.0–52.0)
HEMOGLOBIN: 10.8 g/dL — AB (ref 13.0–17.0)
LYMPHS PCT: 28 %
Lymphs Abs: 2.4 10*3/uL (ref 0.7–4.0)
MCH: 29.1 pg (ref 26.0–34.0)
MCHC: 32.4 g/dL (ref 30.0–36.0)
MCV: 89.8 fL (ref 78.0–100.0)
MONO ABS: 1.9 10*3/uL — AB (ref 0.1–1.0)
Monocytes Relative: 23 %
NEUTROS ABS: 3.9 10*3/uL (ref 1.7–7.7)
NEUTROS PCT: 46 %
PLATELETS: 66 10*3/uL — AB (ref 150–400)
RBC: 3.71 MIL/uL — ABNORMAL LOW (ref 4.22–5.81)
RDW: 19.5 % — ABNORMAL HIGH (ref 11.5–15.5)
WBC: 8.4 10*3/uL (ref 4.0–10.5)

## 2017-02-25 LAB — COMPREHENSIVE METABOLIC PANEL
ALBUMIN: 3.2 g/dL — AB (ref 3.5–5.0)
ALT: 53 U/L (ref 17–63)
ANION GAP: 11 (ref 5–15)
AST: 120 U/L — AB (ref 15–41)
Alkaline Phosphatase: 308 U/L — ABNORMAL HIGH (ref 38–126)
BUN: 13 mg/dL (ref 6–20)
CALCIUM: 9.3 mg/dL (ref 8.9–10.3)
CHLORIDE: 98 mmol/L — AB (ref 101–111)
CO2: 24 mmol/L (ref 22–32)
Creatinine, Ser: 0.7 mg/dL (ref 0.61–1.24)
GFR calc Af Amer: >60 mL/min (ref >60)
GFR calc non Af Amer: >60 mL/min (ref >60)
GLUCOSE: 110 mg/dL — AB (ref 65–99)
POTASSIUM: 3.7 mmol/L (ref 3.5–5.1)
SODIUM: 133 mmol/L — AB (ref 135–145)
Total Bilirubin: 1.9 mg/dL — ABNORMAL HIGH (ref 0.3–1.2)
Total Protein: 7 g/dL (ref 6.5–8.1)

## 2017-02-25 LAB — URINALYSIS, ROUTINE W REFLEX MICROSCOPIC
BACTERIA UA: NONE SEEN
BILIRUBIN URINE: NEGATIVE
GLUCOSE, UA: NEGATIVE mg/dL
Ketones, ur: NEGATIVE mg/dL
LEUKOCYTES UA: NEGATIVE
NITRITE: NEGATIVE
PROTEIN: NEGATIVE mg/dL
Specific Gravity, Urine: 1.024 (ref 1.005–1.030)
pH: 5 (ref 5.0–8.0)

## 2017-02-25 LAB — PROCALCITONIN: Procalcitonin: 0.37 ng/mL

## 2017-02-25 LAB — LACTIC ACID, PLASMA
LACTIC ACID, VENOUS: 2 mmol/L — AB (ref 0.5–1.9)
LACTIC ACID, VENOUS: 1.8 mmol/L (ref 0.5–1.9)
Lactic Acid, Venous: 1.7 mmol/L (ref 0.5–1.9)

## 2017-02-25 LAB — PROTIME-INR
INR: 1.47
PROTHROMBIN TIME: 18 s — AB (ref 11.4–15.2)

## 2017-02-25 LAB — I-STAT CG4 LACTIC ACID, ED: LACTIC ACID, VENOUS: 2.51 mmol/L — AB (ref 0.5–1.9)

## 2017-02-25 MED ORDER — OXYCODONE HCL 5 MG PO TABS
15.0000 mg | ORAL_TABLET | Freq: Four times a day (QID) | ORAL | Status: DC
  Administered 2017-02-26 – 2017-02-27 (×8): 15 mg via ORAL
  Filled 2017-02-25 (×8): qty 3

## 2017-02-25 MED ORDER — ONDANSETRON HCL 4 MG/2ML IJ SOLN
4.0000 mg | Freq: Four times a day (QID) | INTRAMUSCULAR | Status: DC | PRN
Start: 2017-02-25 — End: 2017-02-27

## 2017-02-25 MED ORDER — DOCUSATE SODIUM 100 MG PO CAPS
100.0000 mg | ORAL_CAPSULE | Freq: Two times a day (BID) | ORAL | Status: DC
  Administered 2017-02-25 – 2017-02-27 (×4): 100 mg via ORAL
  Filled 2017-02-25 (×4): qty 1

## 2017-02-25 MED ORDER — PIPERACILLIN-TAZOBACTAM 3.375 G IVPB
3.3750 g | Freq: Three times a day (TID) | INTRAVENOUS | Status: DC
  Administered 2017-02-25 – 2017-02-27 (×5): 3.375 g via INTRAVENOUS
  Filled 2017-02-25 (×4): qty 50

## 2017-02-25 MED ORDER — PIPERACILLIN-TAZOBACTAM 3.375 G IVPB 30 MIN
3.3750 g | Freq: Once | INTRAVENOUS | Status: AC
Start: 2017-02-25 — End: 2017-02-25
  Administered 2017-02-25: 3.375 g via INTRAVENOUS
  Filled 2017-02-25: qty 50

## 2017-02-25 MED ORDER — ONDANSETRON HCL 4 MG PO TABS: 4.0000 mg | ORAL_TABLET | Freq: Four times a day (QID) | ORAL | Status: DC | PRN

## 2017-02-25 MED ORDER — PANTOPRAZOLE SODIUM 40 MG PO TBEC
40.0000 mg | DELAYED_RELEASE_TABLET | Freq: Every day | ORAL | Status: DC
  Administered 2017-02-25 – 2017-02-27 (×3): 40 mg via ORAL
  Filled 2017-02-25 (×3): qty 1

## 2017-02-25 MED ORDER — GADOBENATE DIMEGLUMINE 529 MG/ML IV SOLN
15.0000 mL | Freq: Once | INTRAVENOUS | Status: AC | PRN
  Administered 2017-02-25: 13 mL via INTRAVENOUS

## 2017-02-25 MED ORDER — SODIUM CHLORIDE 0.9 % IV SOLN
1000.0000 mL | INTRAVENOUS | Status: DC
  Administered 2017-02-25 – 2017-02-27 (×5): 1000 mL via INTRAVENOUS

## 2017-02-25 MED ORDER — VANCOMYCIN HCL IN DEXTROSE 1-5 GM/200ML-% IV SOLN
1000.0000 mg | Freq: Once | INTRAVENOUS | Status: AC
  Administered 2017-02-25: 1000 mg via INTRAVENOUS
  Filled 2017-02-25: qty 200

## 2017-02-25 MED ORDER — SODIUM CHLORIDE 0.9% FLUSH
3.0000 mL | Freq: Two times a day (BID) | INTRAVENOUS | Status: DC
  Administered 2017-02-25: 3 mL via INTRAVENOUS

## 2017-02-25 MED ORDER — VANCOMYCIN HCL IN DEXTROSE 750-5 MG/150ML-% IV SOLN
750.0000 mg | Freq: Two times a day (BID) | INTRAVENOUS | Status: DC
  Filled 2017-02-25 (×2): qty 150

## 2017-02-25 MED ORDER — VANCOMYCIN HCL IN DEXTROSE 1-5 GM/200ML-% IV SOLN
1000.0000 mg | Freq: Two times a day (BID) | INTRAVENOUS | Status: DC
  Administered 2017-02-26 – 2017-02-27 (×3): 1000 mg via INTRAVENOUS
  Filled 2017-02-25 (×3): qty 200

## 2017-02-25 MED ORDER — ACETAMINOPHEN 325 MG PO TABS: 650.0000 mg | ORAL_TABLET | Freq: Four times a day (QID) | ORAL | Status: DC | PRN

## 2017-02-25 MED ORDER — ACETAMINOPHEN 650 MG RE SUPP: 650.0000 mg | Freq: Four times a day (QID) | RECTAL | Status: DC | PRN

## 2017-02-25 MED ORDER — KETOROLAC TROMETHAMINE 15 MG/ML IJ SOLN
15.0000 mg | Freq: Four times a day (QID) | INTRAMUSCULAR | Status: DC | PRN
  Administered 2017-02-26: 15 mg via INTRAVENOUS
  Filled 2017-02-25: qty 1

## 2017-02-25 MED ORDER — MORPHINE SULFATE (PF) 4 MG/ML IV SOLN
4.0000 mg | INTRAVENOUS | Status: DC | PRN
  Administered 2017-02-25 – 2017-02-26 (×4): 4 mg via INTRAVENOUS
  Filled 2017-02-25 (×4): qty 1

## 2017-02-25 MED ORDER — POLYETHYLENE GLYCOL 3350 17 G PO PACK: 17.0000 g | PACK | Freq: Every day | ORAL | Status: DC | PRN

## 2017-02-25 NOTE — ED Notes (Signed)
Cooling blanket placed on patient.

## 2017-02-25 NOTE — ED Notes (Signed)
[facility] called and reported that pt needed repeat lactic acid drawn at 1816 and pt needed an additional liter of fluid to comply with 30cc/kg. EDP aware and reported pt initial lactic acid result not >4 and that pt did not meet criteria for additional fluid bolus.

## 2017-02-25 NOTE — ED Notes (Signed)
Pt currently in X-ray.

## 2017-02-25 NOTE — H&P (Signed)
History and Physical    Ryan Ware QIW:[id number] DOB: [birth year] DOA: 02/25/2017  PCP: [facility] Consultants:  Tammi Klippel - rad onc; McCoy - oncology ([place]); McKenzie - urology Patient coming from: home - lives alone; [place]: brother, [phone]  Chief Complaint:  Back pain  HPI: TAHJAI Ware is a 66 y.o. 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  He complains today of back pain for about a year.  Started feeling weak this AM and decided to come to ER.  No fevers/chills.  No urinary symptoms. No cough.  No congestion.  No GI symptoms.  He was reluctant to provide me with information, since he had "already told this story like 7 or 8 times."    HPI per Dr. Thurnell Garbe:  Pt was seen at 1500. Per pt, c/o gradual onset and persistence of constant acute flair of his chronic low back "pain" for the past 3 days.  Denies any change in his usual chronic pain pattern.  Pain worsens with palpation of the area and body position changes. Pain began when he ran out of his pain meds. 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 LD XRT was approximately 1 month ago. Pt does not receive chemo. Denies any lumbar injections.  Denies incont/retention of bowel or bladder, no saddle anesthesia, no focal motor weakness, no tingling/numbness in extremities, no fevers, no injury, no abd pain.   He was hospitalized from 3/14-16 for symptomatic anemia; epistaxis secondary to thrombocytopenia; and pancytopenia.  He was transfused 3 units PRBC and 1 unit of platelets.  He was scheduled for outpatient oncology f/u the following week, but it is not clear that this took place.  He did see rad onc yesterday and it does not appear that he was resumed on radiation therapy.  He was also hospitalized at [facility] from 3/29-4/2 for symptomatic anemia, pancytopenia, and metastatic prostate  cancer.  He received 5 units PRBC during that hospitalization. His discharge CBC was: WBC 2.3, ANC 600, Hgb 8.1, Platelets 45k.  ED Course: code sepsis - applied cooling blanket for fever, Zosyn/Vanc, uncertain source of infection  Review of Systems: As per HPI; otherwise review of systems reviewed and negative.   Ambulatory Status:  "Can't walk too good", uses a walker  Past Medical History:  Diagnosis Date  . Anemia   . Bone cancer ([place])    prostate ca with spine mets  . Elevated LFTs   . Hepatitis C   . Prostate cancer ([place])   . Thrombocytopenia ([place])     Past Surgical History:  Procedure Laterality Date  . HAND SURGERY    . PROSTATE BIOPSY      Social History   Social History  . Marital status: Single    Spouse name: N/A  . Number of children: N/A  . Years of education: N/A   Occupational History  . disabled    Social History Main Topics  . Smoking status: Current Every Day Smoker    Packs/day: 0.50    Years: 54.00    Types: Cigarettes  . Smokeless tobacco: Never Used  . Alcohol use No  . Drug use: No     Comment: remote h/o use, last use about a year ago  . Sexual activity: No   Other Topics Concern  . Not on file   Social History Narrative  . No narrative  on file    Allergies  Allergen Reactions  . Acetaminophen Other (See Comments)    Liver problems  . Ibuprofen     Liver conditions    Family History  Problem Relation Age of Onset  . Hypertension Mother   . Osteoarthritis Father   . Cancer Neg Hx     Prior to Admission medications   Medication Sig Start Date End Date Taking? Authorizing Provider  degarelix (FIRMAGON) 120 MG injection Inject 240 mg into the skin once.    Historical Provider, MD  denosumab (PROLIA) 60 MG/ML SOLN injection Inject 60 mg into the skin every 6 (six) months. Administer in upper arm, thigh, or abdomen    Historical Provider, MD  docusate sodium (COLACE) 100 MG capsule Take 100 mg by mouth 2 (two) times daily.     Historical Provider, MD  Multiple Vitamin (MULTIVITAMIN WITH MINERALS) TABS tablet Take 1 tablet by mouth daily.    Historical Provider, MD  Oxycodone HCl 10 MG TABS Take 15 mg by mouth every 6 (six) hours.     Historical Provider, MD  oxymetazoline (AFRIN) 0.05 % nasal spray Place 1 spray into both nostrils 2 (two) times daily as needed for congestion (epistaxis). 01/30/17   Ryan Cota, MD  pantoprazole (PROTONIX) 40 MG tablet Take 1 tablet (40 mg total) by mouth daily. 01/31/17   Ryan Cota, MD  polyethylene glycol [facility] Floria Raveling) packet Take 17 g by mouth daily as needed.    Historical Provider, MD    Physical Exam: Vitals:   02/25/17 1849 02/25/17 1850 02/25/17 1900 02/25/17 2030  BP: 137/85  131/77   Pulse: (!) 113     Resp: 14  (!) 22   Temp:  (!) 101.3 F (38.5 C)  (!) 100.6 F (38.1 C)  TempSrc:    Rectal  SpO2: 93%     Weight:      Height:         General: Appears chronically ill and anxious/agitated, does not like cooling blanket Eyes:  PERRL, EOMI, normal lids, iris ENT:  grossly normal hearing, lips & tongue, mmm Neck:  no LAD, masses or thyromegaly Cardiovascular:  tachycardia, no m/r/g. No LE edema.  Respiratory:  CTA bilaterally, no w/r/r. Normal respiratory effort. Abdomen:  soft, ntnd, NABS Skin:  no rash or induration seen on limited exam Back: no apparent erythema, edema, skin breaks Musculoskeletal:  grossly normal tone BUE/BLE, good ROM, no bony abnormality Psychiatric:  grossly normal mood and affect, speech fluent and appropriate, AOx3 Neurologic:  CN 2-12 grossly intact, moves all extremities in coordinated fashion, sensation intact  Labs on Admission: I have personally reviewed following labs and imaging studies  CBC:  Recent Labs Lab 02/25/17 1440  WBC 8.4  NEUTROABS 3.9  HGB 10.8*  HCT 33.3*  MCV 89.8  PLT 66*   Basic Metabolic Panel:  Recent Labs Lab 02/25/17 1440  NA 133*  K 3.7  CL 98*  CO2 24  GLUCOSE 110*    BUN 13  CREATININE 0.70  CALCIUM 9.3   GFR: Estimated Creatinine Clearance: 86.2 mL/min (by C-G formula based on SCr of 0.7 mg/dL). Liver Function Tests:  Recent Labs Lab 02/25/17 1440  AST 120*  ALT 53  ALKPHOS 308*  BILITOT 1.9*  PROT 7.0  ALBUMIN 3.2*   No results for input(s): LIPASE, AMYLASE in the last 168 hours. No results for input(s): AMMONIA in the last 168 hours. Coagulation Profile:  Recent Labs Lab 02/25/17 1440  INR 1.47   Cardiac Enzymes: No results for input(s): CKTOTAL, CKMB, CKMBINDEX, TROPONINI in the last 168 hours. BNP (last 3 results) No results for input(s): PROBNP in the last 8760 hours. HbA1C: No results for input(s): HGBA1C in the last 72 hours. CBG: No results for input(s): GLUCAP in the last 168 hours. Lipid Profile: No results for input(s): CHOL, HDL, LDLCALC, TRIG, CHOLHDL, LDLDIRECT in the last 72 hours. Thyroid Function Tests: No results for input(s): TSH, T4TOTAL, FREET4, T3FREE, THYROIDAB in the last 72 hours. Anemia Panel: No results for input(s): VITAMINB12, FOLATE, FERRITIN, TIBC, IRON, RETICCTPCT in the last 72 hours. Urine analysis:    Component Value Date/Time   COLORURINE AMBER (A) 02/25/2017 1440   APPEARANCEUR HAZY (A) 02/25/2017 1440   LABSPEC 1.024 02/25/2017 1440   PHURINE 5.0 02/25/2017 1440   GLUCOSEU NEGATIVE 02/25/2017 1440   HGBUR SMALL (A) 02/25/2017 1440   BILIRUBINUR NEGATIVE 02/25/2017 1440   KETONESUR NEGATIVE 02/25/2017 1440   PROTEINUR NEGATIVE 02/25/2017 1440   NITRITE NEGATIVE 02/25/2017 1440   LEUKOCYTESUR NEGATIVE 02/25/2017 1440    Creatinine Clearance: Estimated Creatinine Clearance: 86.2 mL/min (by C-G formula based on SCr of 0.7 mg/dL).  Sepsis Labs: @LABRCNTIP (procalcitonin:4,lacticidven:4) ) Recent Results (from the past 240 hour(s))  Culture, blood (Routine x 2)     Status: None (Preliminary result)   Collection Time: 02/25/17  2:40 PM  Result Value Ref Range Status   Specimen  Description BLOOD RIGHT FOREARM  Final   Special Requests   Final    BOTTLES DRAWN AEROBIC AND ANAEROBIC Blood Culture adequate volume   Culture PENDING  Incomplete   Report Status PENDING  Incomplete  Culture, blood (Routine x 2)     Status: None (Preliminary result)   Collection Time: 02/25/17  2:45 PM  Result Value Ref Range Status   Specimen Description BLOOD LEFT FOREARM  Final   Special Requests   Final    BOTTLES DRAWN AEROBIC AND ANAEROBIC Blood Culture adequate volume   Culture PENDING  Incomplete   Report Status PENDING  Incomplete     Radiological Exams on Admission: Dg Chest 2 View  Result Date: 02/25/2017 CLINICAL DATA:  Back and chest pain for 3 days. EXAM: CHEST  2 VIEW COMPARISON:  Chest CT 11/14/2016 FINDINGS: The cardiac silhouette, mediastinal and hilar contours are within normal limits and stable. There is mild tortuosity and ectasia of the thoracic aorta. Low lung volumes with vascular crowding and streaky atelectasis but no definite infiltrates or effusions. Diffuse osteoblastic metastatic prostate cancer again demonstrated. No obvious pathologic fracture. IMPRESSION: No acute cardiopulmonary findings. Diffuse osseous metastatic disease without obvious pathologic compression fracture in the thoracic spine. Electronically Signed   By: Marijo Sanes M.D.   On: 02/25/2017 15:48   Dg Lumbar Spine Complete  Result Date: 02/25/2017 CLINICAL DATA:  Back pain for 3 days. Prostate cancer with bone metastasis. Receiving radiation therapy. EXAM: LUMBAR SPINE - COMPLETE 4+ VIEW COMPARISON:  Radiograph 01/28/2017 FINDINGS: There is sclerosis of the vertebral bodies. No acute loss vertebral body height or disc height. Endplate spurring at N2-D7. No pars fracture. IMPRESSION: 1. No acute findings lumbar spine. 2. Disc osteophytic disease from L4-S1. 3. Diffuse bone sclerotic metastasis. Electronically Signed   By: Suzy Bouchard M.D.   On: 02/25/2017 15:47   Mr Lumbar Spine W Wo  Contrast (assess For Abscess, Cord Compression)  Result Date: 02/25/2017 CLINICAL DATA:  Initial evaluation for acute back pain for 3 days. History of metastatic prostate cancer with  osseous metastases. Currently undergoing radiation. EXAM: MRI LUMBAR SPINE WITHOUT AND WITH CONTRAST TECHNIQUE: Multiplanar and multiecho pulse sequences of the lumbar spine were obtained without and with intravenous contrast. CONTRAST:  19mL MULTIHANCE GADOBENATE DIMEGLUMINE 529 MG/ML IV SOLN COMPARISON:  Comparison made with previous MRI from 12/18/2016. FINDINGS: Segmentation: Normal segmentation. Lowest well-formed disc is labeled the L5-S1 level. Alignment: Normal alignment with preservation of the normal lumbar lordosis. No listhesis. Vertebrae: Diffuse osseous metastases seen throughout the visualized bone marrow. Overall, changes appears somewhat progressed relative to recent MRI. Vertebral body heights are maintained without associated vertebral body fracture. Extensive metastases with expansile tumor enhancement present within the visualize sacrum, greater on the right. Would be extremely difficult to exclude a superimposed pathologic fracture at this level. This is incompletely evaluated on this exam. No other associated pathologic fracture. No other significant extra osseous extension of tumor identified. Conus medullaris: Extends to the L2 level and appears normal. Paraspinal and other soft tissues: Paraspinous soft tissues demonstrate no acute abnormality. Visualized visceral structures within normal limits. Left retroperitoneal/iliac lymph nodes measure up to 17 mm in short axis (series 9, image 28). Disc levels: Diffuse congenital shortening of the pedicles. L1-2: Normal disc. Bilateral facet degeneration. No significant canal or foraminal stenosis. L2-3: Diffuse degenerative disc bulge with disc desiccation and intervertebral disc space narrowing. Epidural lipomatosis. Short pedicles. Bilateral facet degeneration with  ligamentum flavum hypertrophy. Stable severe canal stenosis. Thecal sac measures approximately 7 mm in AP diameter. No significant foraminal encroachment. L3-4: Mild disc bulge. Epidural lipomatosis. Short pedicles. Bilateral facet hypertrophy. Resultant severe canal stenosis with the thecal sac measuring 7 mm in AP diameter, similar to previous. No significant foraminal encroachment. L4-5: Left E centric disc bulge. Left greater than right facet arthrosis. Short pedicles with epidural lipomatosis. Resultant severe canal stenosis with the thecal sac measuring 7 mm in AP diameter. Changes relatively stable. No significant foraminal encroachment. L5-S1: Chronic diffuse degenerative disc osteophyte with intervertebral disc space narrowing. Broad posterior component indents the ventral thecal sac, encroaching upon the bilateral lateral recesses. Moderate to advanced bilateral facet arthrosis, worse on the right. Resultant severe subarticular stenosis bilaterally with moderate canal narrowing. Moderate right with mild left foraminal stenosis. Changes relatively stable. IMPRESSION: 1. Diffuse osseous metastatic disease, overall progressed/worsened in appearance relative to prior MRI from 12/18/2016. No associated pathologic fracture within the lumbar spine. Would be extremely difficult to exclude a superimposed pathologic fracture at the level the sacrum. 2. 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 Overall, changes are stable from previous. 3. Left retroperitoneal/iliac adenopathy as above, concerning for nodal metastatic disease. Electronically Signed   By: Jeannine Boga M.D.   On: 02/25/2017 19:12    EKG: Independently reviewed.  Sinus tachycardia with rate 121; rate-related changes with no evidence of acute ischemia, NSCSLT  Assessment/Plan Principal Problem:   Sepsis ([place]) Active  Problems:   Prostate cancer metastatic to bone (HCC)   Protein-calorie malnutrition, severe   Thrombocytopenia ([facility])   Elevated LFTs   Sepsis -Fever, tachycardia, tachypnea with elevated lactate to 2, 2.51, repeat 2.7  -While awaiting blood cultures, this appears to be a preseptic condition. -Sepsis protocol initiated;did not received IVF bolus due to lactate <4 -Uncertain source - UA is negative, CXR negative, Influenza negative -MRI of back obtained to r/o osteomyelitis of the spine as etiology.  MRI shows worsening osseous metastatic disease with stable severe spinal stenosis and  nodal metastatic disease; while this appears to indicate progressive cancer, it does not provide a source for his current infection. -Blood and urine cultures pending. His cell lines are all increased on his CBC so it is possible that this is the result of hemoconcentration; if so, it is possible that he is actually neutropenic - despite WBC 8.4.  Will monitor his CBC and add neutropenic precautions if this appears to be the case. -Admit and monitor on telemetry  -Continue to treat with IV Vanc and Zosyn for now as per the undifferentiated sepsis protocol -Will trend lactate to ensure improvement and check procalcitonin  Metastatic prostate cancer -Appears to be poorly controlled/worsening -AP 308, normal 1 year ago and progressively worsening -Patient appears to be a candidate for Hospice, but this may need to be up to [facility]  Malnutrition -Albumin 3.2  Elevated LFTs -AST 120/ALT 53 -Bilirubin 1.9 -Dr. Talbert Cage previously recommended outpatient GI f/u; it is not clear if this took place. -h/o Hep C, most recent quant was 1.7 million. -It appeared that a C/A/P CT was ordered, but there does not appear to be a result in Care Everywhere.    Anemia/thrombocytopenia -Hgb 10.8, improved -Platelets 66, improved -Per Dr. Talbert Cage, recommendation is to hold platelets unless he is actively bleeding or has  Plt <15k.   DVT prophylaxis:  SCDs Code Status:  DNR - confirmed with patient Family Communication: None present Disposition Plan:  Home once clinically improved Consults called: None  Admission status: Admit - It is my clinical opinion that admission to INPATIENT is reasonable and necessary because this patient will require at least 2 midnights in the hospital to treat this condition based on the medical complexity of the problems presented.  Given the aforementioned information, the predictability of an adverse outcome is felt to be significant.    Karmen Bongo MD [facility]  If 7PM-7AM, please contact night-coverage [email or web address] Password [facility]  02/25/2017, 8:44 PM

## 2017-02-25 NOTE — ED Notes (Signed)
Unable to get repeat lactic at this time. Pt in mri.

## 2017-02-25 NOTE — ED Triage Notes (Signed)
Pt reports back pain X3 days, is CA center pt receiving radiation. Prostate CA with mets to bone. Denies GU sx.

## 2017-02-25 NOTE — Progress Notes (Signed)
Pharmacy Antibiotic Note  Ryan Ware is a 66 y.o. male admitted on 02/25/2017 with sepsis.  Pharmacy has been consulted for vancomycin and zosyn dosing. Vanc 1 gm and zosyn 3.375 gm ordered in the ED  Plan: Cont vanc 750 mg IV q12  Cont zosyn 3.375 gm IV q8 hours. EI f/u renal function, cultures and clinical course  Height: 6' 5.5" (196.9 cm) Weight: 144 lb (65.3 kg) IBW/kg (Calculated) : 90.25  Temp (24hrs), Avg:100.9 F (38.3 C), Min:99.1 F (37.3 C), Max:103.3 F (39.6 C)   Recent Labs Lab 02/25/17 1440 02/25/17 1442 02/25/17 1512  WBC 8.4  --   --   CREATININE 0.70  --   --   LATICACIDVEN  --  2.0* 2.51*    Estimated Creatinine Clearance: 86.2 mL/min (by C-G formula based on SCr of 0.7 mg/dL).    Allergies  Allergen Reactions  . Acetaminophen Other (See Comments)    Liver problems    Antimicrobials this admission: vanc 4/11 >>  zosyn 4/11 >>    Thank you for allowing pharmacy to be a part of this patient's care.  Excell Seltzer Poteet 02/25/2017 4:18 PM

## 2017-02-25 NOTE — ED Notes (Signed)
Dr. Thurnell Garbe made aware that sepsis protocols were placed on patient.

## 2017-02-25 NOTE — ED Provider Notes (Signed)
[facility] DEPT Provider Note   CSN: [id number] Arrival date & time: 02/25/17  1414     History   Chief Complaint Chief Complaint  Patient presents with  . Back Pain    HPI Ryan Ware is a 66 y.o. male.  HPI Pt was seen at 1500. Per pt, c/o gradual onset and persistence of constant acute flair of his chronic low back "pain" for the past 3 days.  Denies any change in his usual chronic pain pattern.  Pain worsens with palpation of the area and body position changes. Pain began when he ran out of his pain meds. 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 LD XRT was approximately 1 month ago. Pt does not receive chemo. Denies any lumbar injections.  Denies incont/retention of bowel or bladder, no saddle anesthesia, no focal motor weakness, no tingling/numbness in extremities, no fevers, no injury, no abd pain.    Past Medical History:  Diagnosis Date  . Anemia   . Bone cancer ([place])    prostate ca with spine mets  . Elevated LFTs   . Hepatitis C   . Prostate cancer ([place])   . Thrombocytopenia [facility])     Patient Active Problem List   Diagnosis Date Noted  . Thrombocytopenia ([place]) 01/28/2017  . Symptomatic anemia 01/28/2017  . Epistaxis 01/28/2017  . Pancytopenia ([place]) 01/28/2017  . Protein-calorie malnutrition, severe 12/20/2016  . Cord compression ([place]) 12/19/2016  . Prostate cancer metastatic to bone ([place]) 12/19/2016  . Hyponatremia 12/19/2016  . Hepatitis C without hepatic coma 12/19/2016    Past Surgical History:  Procedure Laterality Date  . HAND SURGERY    . PROSTATE BIOPSY         Home Medications    Prior to Admission medications   Medication Sig Start Date End Date Taking? Authorizing Provider  degarelix (FIRMAGON) 120 MG injection Inject 240 mg into the skin once.    Historical Provider, MD  denosumab (PROLIA) 60 MG/ML SOLN injection Inject 60 mg into the skin every 6 (six) months. Administer in upper arm, thigh, or abdomen    Historical Provider,  MD  docusate sodium (COLACE) 100 MG capsule Take 100 mg by mouth 2 (two) times daily.    Historical Provider, MD  Multiple Vitamin (MULTIVITAMIN WITH MINERALS) TABS tablet Take 1 tablet by mouth daily.    Historical Provider, MD  Oxycodone HCl 10 MG TABS Take 15 mg by mouth every 6 (six) hours.     Historical Provider, MD  oxymetazoline (AFRIN) 0.05 % nasal spray Place 1 spray into both nostrils 2 (two) times daily as needed for congestion (epistaxis). 01/30/17   Samuella Cota, MD  pantoprazole (PROTONIX) 40 MG tablet Take 1 tablet (40 mg total) by mouth daily. 01/31/17   Samuella Cota, MD  polyethylene glycol [facility] Floria Raveling) packet Take 17 g by mouth daily as needed.    Historical Provider, MD    Family History Family History  Problem Relation Age of Onset  . Hypertension Mother   . Osteoarthritis Father   . Cancer Neg Hx     Social History Social History  Substance Use Topics  . Smoking status: Current Every Day Smoker    Packs/day: 0.50    Years: 50.00    Types: Cigarettes  . Smokeless tobacco: Never Used  . Alcohol use No     Comment: occasional     Allergies   Acetaminophen   Review of Systems Review of Systems ROS: Statement:  All systems negative except as marked or noted in the HPI; Constitutional: Negative for fever and chills. ; ; Eyes: Negative for eye pain, redness and discharge. ; ; ENMT: Negative for ear pain, hoarseness, nasal congestion, sinus pressure and sore throat. ; ; Cardiovascular: Negative for chest pain, palpitations, diaphoresis, dyspnea and peripheral edema. ; ; Respiratory: Negative for cough, wheezing and stridor. ; ; Gastrointestinal: Negative for nausea, vomiting, diarrhea, abdominal pain, blood in stool, hematemesis, jaundice and rectal bleeding. ; ; Genitourinary: Negative for dysuria, flank pain and hematuria. ; ; Musculoskeletal: +chronic LBP. Negative for neck pain. Negative for swelling and trauma.; ; Skin: Negative for pruritus,  rash, abrasions, blisters, bruising and skin lesion.; ; Neuro: Negative for headache, lightheadedness and neck stiffness. Negative for weakness, altered level of consciousness, altered mental status, extremity weakness, paresthesias, involuntary movement, seizure and syncope.      Physical Exam Updated Vital Signs BP 131/72 (BP Location: Right Arm)   Pulse (!) 131   Temp (!) 103.3 F (39.6 C) (Rectal)   Resp (!) 22   Ht 6' 5.5" (1.969 m)   Wt 144 lb (65.3 kg)   SpO2 98%   BMI 16.86 kg/m    Patient Vitals for the past 24 hrs:  BP Temp Temp src Pulse Resp SpO2 Height Weight  02/25/17 1849 137/85 - - (!) 113 14 93 % - -  02/25/17 1730 134/81 - - (!) 101 20 97 % - -  02/25/17 1711 - (!) 101.8 F (38.8 C) Rectal - - - - -  02/25/17 1700 117/63 - - (!) 109 (!) 28 96 % - -  02/25/17 1630 125/76 - - (!) 111 (!) 23 96 % - -  02/25/17 1600 128/79 - - - 18 96 % - -  02/25/17 1541 120/76 - - (!) 115 (!) 29 97 % - -  02/25/17 1500 124/76 - - - (!) 28 - - -  02/25/17 1456 - (!) 103.3 F (39.6 C) Rectal - - - - -  02/25/17 1438 - 100.2 F (37.9 C) Oral - - - - -  02/25/17 1425 131/72 99.1 F (37.3 C) Oral (!) 131 (!) 22 98 % - -  02/25/17 1422 - - - - - - 6' 5.5" (1.969 m) 144 lb (65.3 kg)      Physical Exam 1505: Physical examination:  Nursing notes reviewed; Vital signs and O2 SAT reviewed; +febrile.;; Constitutional: Well developed, Well nourished, In no acute distress; Head:  Normocephalic, atraumatic; Eyes: EOMI, PERRL, No scleral icterus; ENMT: Mouth and pharynx normal, Mucous membranes dry; Neck: Supple, Full range of motion, No lymphadenopathy. No meningeal signs.; Cardiovascular: Tachycardic rate and rhythm, No gallop; Respiratory: Breath sounds clear & equal bilaterally, No wheezes.  Speaking full sentences with ease, Normal respiratory effort/excursion; Chest: Nontender, Movement normal; Abdomen: Soft, Nontender, Nondistended, Normal bowel sounds; Genitourinary: No CVA  tenderness; Spine:  No midline CS, TS, LS tenderness. +TTP bilat lumbar paraspinal muscles.;; Extremities: Pulses normal, No tenderness, No edema, No calf edema or asymmetry.; Neuro: AA&Ox3, vague historian. Major CN grossly intact.  Speech clear. No gross focal motor or sensory deficits in extremities.; Skin: Color normal, Warm, Dry.   ED Treatments / Results  Labs (all labs ordered are listed, but only abnormal results are displayed)   EKG  EKG Interpretation  Date/Time:  Wednesday February 25 2017 14:35:30 EDT Ventricular Rate:  121 PR Interval:    QRS Duration: 89 QT Interval:  337 QTC Calculation: 479 R Axis:   -  90 Text Interpretation:  Sinus tachycardia LAE, consider biatrial enlargement Left anterior fascicular block Probable anteroseptal infarct, old When compared with ECG of 11/14/2016 No significant change was found Confirmed by [facility]  MD, Nunzio Cory [phone]) on 02/25/2017 3:25:18 PM       Radiology   Procedures Procedures (including critical care time)  Medications Ordered in ED Medications  0.9 %  sodium chloride infusion (not administered)  piperacillin-tazobactam (ZOSYN) IVPB 3.375 g (not administered)  vancomycin (VANCOCIN) IVPB 1000 mg/200 mL premix (not administered)     Initial Impression / Assessment and Plan / ED Course  I have reviewed the triage vital signs and the nursing notes.  Pertinent labs & imaging results that were available during my care of the patient were reviewed by me and considered in my medical decision making (see chart for details).  MDM Reviewed: previous chart, nursing note and vitals Reviewed previous: labs, ECG and MRI Interpretation: labs and ECG Total time providing critical care: 30-74 minutes. This excludes time spent performing separately reportable procedures and services. Consults: admitting MD   CRITICAL CARE Performed by: Alfonzo Feller Total critical care time: 45 minutes Critical care time was exclusive of  separately billable procedures and treating other patients. Critical care was necessary to treat or prevent imminent or life-threatening deterioration. Critical care was time spent personally by me on the following activities: development of treatment plan with patient and/or surrogate as well as nursing, discussions with consultants, evaluation of patient's response to treatment, examination of patient, obtaining history from patient or surrogate, ordering and performing treatments and interventions, ordering and review of laboratory studies, ordering and review of radiographic studies, pulse oximetry and re-evaluation of patient's condition.   Results for orders placed or performed during the hospital encounter of 02/25/17  Culture, blood (Routine x 2)  Result Value Ref Range   Specimen Description BLOOD RIGHT FOREARM    Special Requests      BOTTLES DRAWN AEROBIC AND ANAEROBIC Blood Culture adequate volume   Culture PENDING    Report Status PENDING   Culture, blood (Routine x 2)  Result Value Ref Range   Specimen Description BLOOD LEFT FOREARM    Special Requests      BOTTLES DRAWN AEROBIC AND ANAEROBIC Blood Culture adequate volume   Culture PENDING    Report Status PENDING   Comprehensive metabolic panel  Result Value Ref Range   Sodium 133 (L) 135 - 145 mmol/L   Potassium 3.7 3.5 - 5.1 mmol/L   Chloride 98 (L) 101 - 111 mmol/L   CO2 24 22 - 32 mmol/L   Glucose, Bld 110 (H) 65 - 99 mg/dL   BUN 13 6 - 20 mg/dL   Creatinine, Ser 0.70 0.61 - 1.24 mg/dL   Calcium 9.3 8.9 - 10.3 mg/dL   Total Protein 7.0 6.5 - 8.1 g/dL   Albumin 3.2 (L) 3.5 - 5.0 g/dL   AST 120 (H) 15 - 41 U/L   ALT 53 17 - 63 U/L   Alkaline Phosphatase 308 (H) 38 - 126 U/L   Total Bilirubin 1.9 (H) 0.3 - 1.2 mg/dL   GFR calc non Af Amer >60 >60 mL/min   GFR calc Af Amer >60 >60 mL/min   Anion gap 11 5 - 15  CBC with Differential  Result Value Ref Range   WBC 8.4 4.0 - 10.5 K/uL   RBC 3.71 (L) 4.22 - 5.81  MIL/uL   Hemoglobin 10.8 (L) 13.0 - 17.0 g/dL   HCT 33.3 (L)  39.0 - 52.0 %   MCV 89.8 78.0 - 100.0 fL   MCH 29.1 26.0 - 34.0 pg   MCHC 32.4 30.0 - 36.0 g/dL   RDW 19.5 (H) 11.5 - 15.5 %   Platelets 66 (L) 150 - 400 K/uL   Neutrophils Relative % 46 %   Neutro Abs 3.9 1.7 - 7.7 K/uL   Lymphocytes Relative 28 %   Lymphs Abs 2.4 0.7 - 4.0 K/uL   Monocytes Relative 23 %   Monocytes Absolute 1.9 (H) 0.1 - 1.0 K/uL   Eosinophils Relative 0 %   Eosinophils Absolute 0.0 0.0 - 0.7 K/uL   Basophils Relative 2 %   Basophils Absolute 0.2 (H) 0.0 - 0.1 K/uL   WBC Morphology MILD LEFT SHIFT (1-5% METAS, OCC MYELO, OCC BANDS)    RBC Morphology POLYCHROMASIA PRESENT   Protime-INR  Result Value Ref Range   Prothrombin Time 18.0 (H) 11.4 - 15.2 seconds   INR 1.47   Urinalysis, Routine w reflex microscopic  Result Value Ref Range   Color, Urine AMBER (A) YELLOW   APPearance HAZY (A) CLEAR   Specific Gravity, Urine 1.024 1.005 - 1.030   pH 5.0 5.0 - 8.0   Glucose, UA NEGATIVE NEGATIVE mg/dL   Hgb urine dipstick SMALL (A) NEGATIVE   Bilirubin Urine NEGATIVE NEGATIVE   Ketones, ur NEGATIVE NEGATIVE mg/dL   Protein, ur NEGATIVE NEGATIVE mg/dL   Nitrite NEGATIVE NEGATIVE   Leukocytes, UA NEGATIVE NEGATIVE   RBC / HPF 0-5 0 - 5 RBC/hpf   WBC, UA 0-5 0 - 5 WBC/hpf   Bacteria, UA NONE SEEN NONE SEEN   Squamous Epithelial / LPF 0-5 (A) NONE SEEN   Mucous PRESENT   Lactic acid, plasma  Result Value Ref Range   Lactic Acid, Venous 2.0 (HH) 0.5 - 1.9 mmol/L  Influenza panel by PCR (type A & B)  Result Value Ref Range   Influenza A By PCR NEGATIVE NEGATIVE   Influenza B By PCR NEGATIVE NEGATIVE  Lactic acid, plasma  Result Value Ref Range   Lactic Acid, Venous 1.7 0.5 - 1.9 mmol/L  I-Stat CG4 Lactic Acid, ED  (not at  [facility])  Result Value Ref Range   Lactic Acid, Venous 2.51 (HH) 0.5 - 1.9 mmol/L   Dg Chest 2 View Result Date: 02/25/2017 CLINICAL DATA:  Back and chest pain for 3 days. EXAM:  CHEST  2 VIEW COMPARISON:  Chest CT 11/14/2016 FINDINGS: The cardiac silhouette, mediastinal and hilar contours are within normal limits and stable. There is mild tortuosity and ectasia of the thoracic aorta. Low lung volumes with vascular crowding and streaky atelectasis but no definite infiltrates or effusions. Diffuse osteoblastic metastatic prostate cancer again demonstrated. No obvious pathologic fracture. IMPRESSION: No acute cardiopulmonary findings. Diffuse osseous metastatic disease without obvious pathologic compression fracture in the thoracic spine. Electronically Signed   By: Marijo Sanes M.D.   On: 02/25/2017 15:48   Dg Lumbar Spine Complete Result Date: 02/25/2017 CLINICAL DATA:  Back pain for 3 days. Prostate cancer with bone metastasis. Receiving radiation therapy. EXAM: LUMBAR SPINE - COMPLETE 4+ VIEW COMPARISON:  Radiograph 01/28/2017 FINDINGS: There is sclerosis of the vertebral bodies. No acute loss vertebral body height or disc height. Endplate spurring at N8-M7. No pars fracture. IMPRESSION: 1. No acute findings lumbar spine. 2. Disc osteophytic disease from L4-S1. 3. Diffuse bone sclerotic metastasis. Electronically Signed   By: Suzy Bouchard M.D.   On: 02/25/2017 15:47  1610:  Chronic anemia and thrombocytopenia per hx. Elevated LFT's per hx; will apply cooling blanket for fever and avoid APAP. Lab lactate and I-stat lactic acid drawn close in time, slight difference in both (likely within margin of error for either test) and neither above 4. No hypotension. No shock. No indication for IVF 30mg /kg bolus at this time.   1740:  Fever trending downward with cooling blanket. BP remains stable. No clear source for infection. T/C to [facility]. Lorin Mercy, case discussed, including:  HPI, pertinent PM/SHx, VS/PE, dx testing, ED course and treatment:  Agreeable to admit, requests to obtain MRI LS (given pt's hx bony mets).     Final Clinical Impressions(s) / ED Diagnoses   Final  diagnoses:  None    New Prescriptions New Prescriptions   No medications on file     Francine Graven, DO 03/04/17 1014

## 2017-02-25 NOTE — ED Notes (Signed)
Pt unable to give urine specimen at this time.   Charge RN trying to obtain cooling blanket. AC bringing one to this department.

## 2017-02-25 NOTE — ED Notes (Signed)
CRITICAL VALUE ALERT  Critical value received:  Lactic Acid 2.0 (Lab critical)  Date of notification:  02/25/17  Time of notification:  9470  Critical value read back:Yes.    Nurse who received alert:  Charmayne Sheer, RN  MD notified (1st page):  Thurnell Garbe

## 2017-02-26 ENCOUNTER — Encounter (HOSPITAL_COMMUNITY): Payer: Self-pay | Admitting: *Deleted

## 2017-02-26 DIAGNOSIS — G8929 Other chronic pain: Secondary | ICD-10-CM

## 2017-02-26 DIAGNOSIS — A419 Sepsis, unspecified organism: Principal | ICD-10-CM

## 2017-02-26 DIAGNOSIS — M545 Low back pain: Secondary | ICD-10-CM

## 2017-02-26 DIAGNOSIS — C7951 Secondary malignant neoplasm of bone: Secondary | ICD-10-CM

## 2017-02-26 LAB — BASIC METABOLIC PANEL
Anion gap: 7 (ref 5–15)
BUN: 12 mg/dL (ref 6–20)
CALCIUM: 8.7 mg/dL — AB (ref 8.9–10.3)
CO2: 24 mmol/L (ref 22–32)
Chloride: 101 mmol/L (ref 101–111)
Creatinine, Ser: 0.51 mg/dL — ABNORMAL LOW (ref 0.61–1.24)
GFR calc Af Amer: >60 mL/min (ref >60)
GLUCOSE: 99 mg/dL (ref 65–99)
Potassium: 3.8 mmol/L (ref 3.5–5.1)
Sodium: 132 mmol/L — ABNORMAL LOW (ref 135–145)

## 2017-02-26 LAB — CBC
HEMATOCRIT: 27.2 % — AB (ref 39.0–52.0)
Hemoglobin: 9 g/dL — ABNORMAL LOW (ref 13.0–17.0)
MCH: 29.5 pg (ref 26.0–34.0)
MCHC: 33.1 g/dL (ref 30.0–36.0)
MCV: 89.2 fL (ref 78.0–100.0)
Platelets: 51 10*3/uL — ABNORMAL LOW (ref 150–400)
RBC: 3.05 MIL/uL — ABNORMAL LOW (ref 4.22–5.81)
RDW: 19.1 % — AB (ref 11.5–15.5)
WBC: 5.7 10*3/uL (ref 4.0–10.5)

## 2017-02-26 LAB — URINE CULTURE: Culture: NO GROWTH

## 2017-02-26 LAB — LACTIC ACID, PLASMA
Lactic Acid, Venous: 1.3 mmol/L (ref 0.5–1.9)
Lactic Acid, Venous: 2.2 mmol/L (ref 0.5–1.9)

## 2017-02-26 MED ORDER — SODIUM CHLORIDE 0.9 % IV BOLUS (SEPSIS)
500.0000 mL | Freq: Once | INTRAVENOUS | Status: AC
  Administered 2017-02-26: 500 mL via INTRAVENOUS

## 2017-02-26 MED ORDER — ENSURE ENLIVE PO LIQD
237.0000 mL | Freq: Three times a day (TID) | ORAL | Status: DC
  Administered 2017-02-26 – 2017-02-27 (×3): 237 mL via ORAL

## 2017-02-26 NOTE — Progress Notes (Signed)
CRITICAL VALUE ALERT  Critical value received: Lactic acid 2.2  Date of notification: 02/26/2017  Time of notification: 0047  Critical value read back:yes  Nurse who received alert: Javier Docker  MD notified (1st page): Raliegh Ip Schorr  Time of first page:  0051  MD notified (2nd page):  Time of second page:  Responding MD:K. Schorr  Time MD responded:  [phone]

## 2017-02-26 NOTE — Progress Notes (Signed)
Initial Nutrition Assessment  DOCUMENTATION CODES:  Severe malnutrition in context of chronic illness, Underweight  INTERVENTION:  Ensure Enlive po TID, each supplement provides 350 kcal and 20 grams of protein  Magic cup Q 24 hrs, each supplement provides 290 kcal and 9 grams of protein  NUTRITION DIAGNOSIS:  Malnutrition (Severe) related to cancer and cancer related treatments as evidenced by loss of ~30% bw (60 lbs) in just over 1 year and severe muscle/fat depletion  GOAL:  Patient will meet greater than or equal to 90% of their needs  MONITOR:  PO intake, Supplement acceptance, Labs, Weight trends  REASON FOR ASSESSMENT:  Malnutrition Screening Tool    ASSESSMENT:  66 y/o male PMHx Prostate Cancer w/ Bone metastases, anemia, thrombocytopenia, leukopenia, Hep C. Has had 2 hospitalizations in last month form anemia. Presented to ED with weakness and flair of back pain x3 days. Worked up for sepsis and MRI showed worsening of osseous metastases  Pt was seen by this RD ~1 month ago. At that time, he  had resolving dysphagia that he related to cancer treatments and had briefly been on pureed diet.    Encouragingly, he has gained 4-5 lbs in the last month.   Pt says that, since he was last seen, his meal intake has decreased to 2 per day. He is drinking only 1 boost supplement per day. He still is eating mostly vegetables. His swallowing difficulties have largely resolved. Denies any n/v/c/d.   At this time, patient has no complaints other than some pain. He declines any aggressive supplementation or meal alterations, stating "lets just keep it regular".   Per last encounter, he said his UBW all his life was 215-220 lbs. Per chart review, he appears to have been 211 lbs March 2017, when he was found to have the lesions. Has lost roughly 60 lbs in the last 12-13 months  Physical Exam: Severe Muscle/fat wasting  Labs: H/H:9/27.2, Albumin: 3.2,  Medicines: PPI, IV abx, Oxycodone,  Colace, Morphine, NS IVF   Recent Labs Lab 02/25/17 1440 02/26/17 0521  NA 133* 132*  K 3.7 3.8  CL 98* 101  CO2 24 24  BUN 13 12  CREATININE 0.70 0.51*  CALCIUM 9.3 8.7*  GLUCOSE 110* 99   Diet Order:  Diet regular Room service appropriate? Yes; Fluid consistency: Thin  Skin:  Reviewed, no issues  Last BM:  4/10  Height:  Ht Readings from Last 1 Encounters:  02/25/17 6' 5.5" (1.969 m)   Weight:  Wt Readings from Last 1 Encounters:  02/25/17 148 lb 13 oz (67.5 kg)   Wt Readings from Last 10 Encounters:  02/25/17 148 lb 13 oz (67.5 kg)  01/28/17 144 lb 12.8 oz (65.7 kg)  01/15/17 161 lb 9.6 oz (73.3 kg)  01/09/17 159 lb 3.2 oz (72.2 kg)  01/05/17 159 lb (72.1 kg)  11/14/16 200 lb (90.7 kg)  02/21/16 210 lb (95.3 kg)  01/28/16 211 lb 1 oz (95.7 kg)  11/14/14 230 lb (104.3 kg)   Ideal Body Weight:  95.91 kg  BMI:  Body mass index is 17.42 kg/m.  Estimated Nutritional Needs:  Kcal:  2350-2550 kcals (35-38 kcal/kg bw) Protein:  100-115g Pro (1.5-1.7 g/kg bw) Fluid:  2.4-2.6 L fluid (1 ml/kcal)  EDUCATION NEEDS:  No education needs identified at this time  Burtis Junes RD, LDN, [place] [facility] Pager: [phone] 02/26/2017 1:10 PM

## 2017-02-26 NOTE — Progress Notes (Signed)
Critical lab value received from lab. Lactic acid 2.2. Mid level K. Schorr had been notified. Order received after first page @0059  for NS bolus and lactic acid blood draw @5am 

## 2017-02-26 NOTE — Progress Notes (Signed)
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  He was started on IV antibiotics.  MRI of the LS to r/out spinal absess yield worsening of his metastatic disease.   BC and urine culture has not grow anything as of today.  Procalcitonin was negative.  He has been followed by his oncologist at the [facility].  Pain seems to be in adequate control.    Assessment & Plan:   Principal Problem:   Sepsis ([place]) Active Problems:   Prostate cancer metastatic to bone (HCC)   Protein-calorie malnutrition, severe   Thrombocytopenia (HCC)   Elevated LFTs   1. Backpain:  No epidural abscess.  Worsening of his metastatic disease.  He would like to follow up with his oncologist at the [facility]   2. Sepsis:  Will follow BC and urine culture.  If negative will d/c antibiotics and d/c him home.  3. Pancytopenia:   WBC 5.7, Hb 9, Platelet 51K.  All stable.   DVT prophylaxis: SCD.  Code Status: DNR.  Family Communication: None.  Disposition Plan: Home when appropriate.   Consultants:   None.   Procedures:   None.   Antimicrobials: Anti-infectives    Start     Dose/Rate Route Frequency Ordered Stop   02/25/17 2300  piperacillin-tazobactam (ZOSYN) IVPB 3.375 g     3.375 g 12.5 mL/hr over 240 Minutes Intravenous Every 8 hours 02/25/17 1617     02/25/17 2200  vancomycin (VANCOCIN) IVPB 750 mg/150 ml premix  Status:  Discontinued     750 mg 150 mL/hr over 60 Minutes Intravenous Every 12 hours 02/25/17 1617 02/25/17 1627   02/25/17 2200  vancomycin (VANCOCIN) IVPB 1000 mg/200 mL premix     1,000 mg 200 mL/hr over 60 Minutes Intravenous Every 12 hours 02/25/17 1627     02/25/17 1515  piperacillin-tazobactam (ZOSYN) IVPB 3.375 g     3.375 g 100 mL/hr  over 30 Minutes Intravenous  Once 02/25/17 1514 02/25/17 1607   02/25/17 1515  vancomycin (VANCOCIN) IVPB 1000 mg/200 mL premix     1,000 mg 200 mL/hr over 60 Minutes Intravenous  Once 02/25/17 1514 02/25/17 1711       Subjective:   Feeling better.   Objective: Vitals:   02/25/17 1900 02/25/17 2030 02/25/17 2042 02/26/17 0627  BP: 131/77  122/68 126/71  Pulse:   (!) 107 (!) 116  Resp: (!) 22  20 (!) 21  Temp:  (!) 100.6 F (38.1 C)  100 F (37.8 C)  TempSrc:  Rectal  Oral  SpO2:   98% 96%  Weight:   67.5 kg (148 lb 13 oz)   Height:   6' 5.5" (1.969 m)     Intake/Output Summary (Last 24 hours) at 02/26/17 1226 Last data filed at 02/26/17 0700  Gross per 24 hour  Intake             1250 ml  Output              300 ml  Net              950 ml   Filed Weights   02/25/17 1422 02/25/17 2042  Weight: 65.3 kg (144 lb) 67.5 kg (148 lb 13 oz)    Examination:  General  exam: Appears calm and comfortable  Respiratory system: Clear to auscultation. Respiratory effort normal. Cardiovascular system: S1 & S2 heard, RRR. No JVD, murmurs, rubs, gallops or clicks. No pedal edema. Gastrointestinal system: Abdomen is nondistended, soft and nontender. No organomegaly or masses felt. Normal bowel sounds heard. Central nervous system: Alert and oriented. No focal neurological deficits. Extremities: Symmetric 5 x 5 power. Skin: No rashes, lesions or ulcers Psychiatry: Judgement and insight appear normal. Mood & affect appropriate.   Data Reviewed: I have personally reviewed following labs and imaging studies  CBC:  Recent Labs Lab 02/25/17 1440 02/26/17 0521  WBC 8.4 5.7  NEUTROABS 3.9  --   HGB 10.8* 9.0*  HCT 33.3* 27.2*  MCV 89.8 89.2  PLT 66* 51*   Basic Metabolic Panel:  Recent Labs Lab 02/25/17 1440 02/26/17 0521  NA 133* 132*  K 3.7 3.8  CL 98* 101  CO2 24 24  GLUCOSE 110* 99  BUN 13 12  CREATININE 0.70 0.51*  CALCIUM 9.3 8.7*   GFR: Estimated Creatinine  Clearance: 89.1 mL/min (A) (by C-G formula based on SCr of 0.51 mg/dL (L)). Liver Function Tests:  Recent Labs Lab 02/25/17 1440  AST 120*  ALT 53  ALKPHOS 308*  BILITOT 1.9*  PROT 7.0  ALBUMIN 3.2*   Coagulation Profile:  Recent Labs Lab 02/25/17 1440  INR 1.47   Sepsis Labs:  Recent Labs Lab 02/25/17 1910 02/25/17 2149 02/26/17 0009 02/26/17 0521  PROCALCITON  --  0.37  --   --   LATICACIDVEN 1.7 1.8 2.2* 1.3    Recent Results (from the past 240 hour(s))  Culture, blood (Routine x 2)     Status: None (Preliminary result)   Collection Time: 02/25/17  2:40 PM  Result Value Ref Range Status   Specimen Description BLOOD RIGHT FOREARM  Final   Special Requests   Final    BOTTLES DRAWN AEROBIC AND ANAEROBIC Blood Culture adequate volume   Culture PENDING  Incomplete   Report Status PENDING  Incomplete  Culture, blood (Routine x 2)     Status: None (Preliminary result)   Collection Time: 02/25/17  2:45 PM  Result Value Ref Range Status   Specimen Description BLOOD LEFT FOREARM  Final   Special Requests   Final    BOTTLES DRAWN AEROBIC AND ANAEROBIC Blood Culture adequate volume   Culture PENDING  Incomplete   Report Status PENDING  Incomplete     Radiology Studies: Dg Chest 2 View  Result Date: 02/25/2017 CLINICAL DATA:  Back and chest pain for 3 days. EXAM: CHEST  2 VIEW COMPARISON:  Chest CT 11/14/2016 FINDINGS: The cardiac silhouette, mediastinal and hilar contours are within normal limits and stable. There is mild tortuosity and ectasia of the thoracic aorta. Low lung volumes with vascular crowding and streaky atelectasis but no definite infiltrates or effusions. Diffuse osteoblastic metastatic prostate cancer again demonstrated. No obvious pathologic fracture. IMPRESSION: No acute cardiopulmonary findings. Diffuse osseous metastatic disease without obvious pathologic compression fracture in the thoracic spine. Electronically Signed   By: Marijo Sanes M.D.   On:  02/25/2017 15:48   Dg Lumbar Spine Complete  Result Date: 02/25/2017 CLINICAL DATA:  Back pain for 3 days. Prostate cancer with bone metastasis. Receiving radiation therapy. EXAM: LUMBAR SPINE - COMPLETE 4+ VIEW COMPARISON:  Radiograph 01/28/2017 FINDINGS: There is sclerosis of the vertebral bodies. No acute loss vertebral body height or disc height. Endplate spurring at Z0-Y1. No pars fracture. IMPRESSION: 1. No acute findings lumbar  spine. 2. Disc osteophytic disease from L4-S1. 3. Diffuse bone sclerotic metastasis. Electronically Signed   By: Suzy Bouchard M.D.   On: 02/25/2017 15:47   Mr Lumbar Spine W Wo Contrast (assess For Abscess, Cord Compression)  Result Date: 02/25/2017 CLINICAL DATA:  Initial evaluation for acute back pain for 3 days. History of metastatic prostate cancer with osseous metastases. Currently undergoing radiation. EXAM: MRI LUMBAR SPINE WITHOUT AND WITH CONTRAST TECHNIQUE: Multiplanar and multiecho pulse sequences of the lumbar spine were obtained without and with intravenous contrast. CONTRAST:  34mL MULTIHANCE GADOBENATE DIMEGLUMINE 529 MG/ML IV SOLN COMPARISON:  Comparison made with previous MRI from 12/18/2016. FINDINGS: Segmentation: Normal segmentation. Lowest well-formed disc is labeled the L5-S1 level. Alignment: Normal alignment with preservation of the normal lumbar lordosis. No listhesis. Vertebrae: Diffuse osseous metastases seen throughout the visualized bone marrow. Overall, changes appears somewhat progressed relative to recent MRI. Vertebral body heights are maintained without associated vertebral body fracture. Extensive metastases with expansile tumor enhancement present within the visualize sacrum, greater on the right. Would be extremely difficult to exclude a superimposed pathologic fracture at this level. This is incompletely evaluated on this exam. No other associated pathologic fracture. No other significant extra osseous extension of tumor identified.  Conus medullaris: Extends to the L2 level and appears normal. Paraspinal and other soft tissues: Paraspinous soft tissues demonstrate no acute abnormality. Visualized visceral structures within normal limits. Left retroperitoneal/iliac lymph nodes measure up to 17 mm in short axis (series 9, image 28). Disc levels: Diffuse congenital shortening of the pedicles. L1-2: Normal disc. Bilateral facet degeneration. No significant canal or foraminal stenosis. L2-3: Diffuse degenerative disc bulge with disc desiccation and intervertebral disc space narrowing. Epidural lipomatosis. Short pedicles. Bilateral facet degeneration with ligamentum flavum hypertrophy. Stable severe canal stenosis. Thecal sac measures approximately 7 mm in AP diameter. No significant foraminal encroachment. L3-4: Mild disc bulge. Epidural lipomatosis. Short pedicles. Bilateral facet hypertrophy. Resultant severe canal stenosis with the thecal sac measuring 7 mm in AP diameter, similar to previous. No significant foraminal encroachment. L4-5: Left E centric disc bulge. Left greater than right facet arthrosis. Short pedicles with epidural lipomatosis. Resultant severe canal stenosis with the thecal sac measuring 7 mm in AP diameter. Changes relatively stable. No significant foraminal encroachment. L5-S1: Chronic diffuse degenerative disc osteophyte with intervertebral disc space narrowing. Broad posterior component indents the ventral thecal sac, encroaching upon the bilateral lateral recesses. Moderate to advanced bilateral facet arthrosis, worse on the right. Resultant severe subarticular stenosis bilaterally with moderate canal narrowing. Moderate right with mild left foraminal stenosis. Changes relatively stable. IMPRESSION: 1. Diffuse osseous metastatic disease, overall progressed/worsened in appearance relative to prior MRI from 12/18/2016. No associated pathologic fracture within the lumbar spine. Would be extremely difficult to exclude a  superimposed pathologic fracture at the level the sacrum. 2. 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 Overall, changes are stable from previous. 3. Left retroperitoneal/iliac adenopathy as above, concerning for nodal metastatic disease. Electronically Signed   By: Jeannine Boga M.D.   On: 02/25/2017 19:12    Scheduled Meds: . docusate sodium  100 mg Oral BID  . oxyCODONE  15 mg Oral Q6H  . pantoprazole  40 mg Oral Daily  . piperacillin-tazobactam (ZOSYN)  IV  3.375 g Intravenous Q8H  . sodium chloride flush  3 mL Intravenous Q12H  . vancomycin  1,000 mg Intravenous Q12H   Continuous Infusions: .  sodium chloride 1,000 mL (02/26/17 0859)     LOS: 1 day   Imelda Dandridge, MD FACP Hospitalist.   If 7PM-7AM, please contact night-coverage [email or web address] Password [facility] 02/26/2017, 12:26 PM

## 2017-02-27 DIAGNOSIS — D649 Anemia, unspecified: Secondary | ICD-10-CM

## 2017-02-27 NOTE — Care Management Note (Signed)
Case Management Note  Patient Details  Name: Ryan Ware MRN: [id number] Date of Birth: 10/13/1951  Subjective/Objective:                  Pt from home, lives alone, ind with ADL's. Uses rollator for mobility. He has brother who helps get him to appointments but is limited to the assistance he can provide. Pt reports being active with [facility] services. He believes the company to be [facility] however [facility] rep reports pt not being active with them. [facility] on previous admission. Per [employer] rep, pt is active with them for aid services 3 days a week.   Action/Plan: Pt discharging home today with resumption of aid services. Pt's SW at [facility] has been left VM to notify MD of admission and DC from hospital. Pt communicates no needs.   Expected Discharge Date:  02/27/17               Expected Discharge Plan:  [name]  In-House Referral:  NA  Discharge planning Services  CM Consult  Status of Service:  Completed, signed off   Sherald Barge, RN 02/27/2017, 2:32 PM

## 2017-02-27 NOTE — Discharge Summary (Signed)
Physician Discharge Summary  Ryan Ware IRJ:[id number] DOB: 05-24-1951 DOA: 02/25/2017  PCP: [place] date: 02/25/2017 Discharge date: 02/27/2017  Admitted From: Home.  Disposition: To home.   Recommendations for Outpatient Follow-up:  1. Follow up with PCP in 1-2 weeks 2. Follow up with oncology next week at the [place].  3.  Home Health: Yes.  Equipment/Devices: None.  Discharge Condition: no fever, chills, and back pain is tolerable.  CODE STATUS: DNR>  Diet recommendation: As tolerated.   Brief/Interim Summary: Patient was admitted for increased back pain, r/out spinal abscess or sepsis, on February 25, 2017 by Dr Ryan Ware.  As per her H and P:  " Ryan Ware is a 66 y.o. 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  He complains today of back pain for about a year.  Started feeling weak this AM and decided to come to ER.  No fevers/chills.  No urinary symptoms. No cough.  No congestion.  No GI symptoms.  He was reluctant to provide me with information, since he had "already told this story like 7 or 8 times."    HPI per Dr. Thurnell Ware:  Pt was seen at 1500. Per pt, c/o gradual onset and persistence of constant acute flair of his chronic low back "pain" for the past 3days. Denies any change in his usual chronic pain pattern. Pain worsens with palpation of the area and body position changes. Pain began when he ran out of his pain meds. 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 LD XRT was approximately 1 month ago. Pt does not receive chemo. Denies any lumbar injections. Denies incont/retention of bowel or bladder, no saddle anesthesia, no focal motor weakness, no tingling/numbness in extremities, no fevers, no injury, no abd pain.   He was hospitalized from 3/14-16 for symptomatic anemia; epistaxis secondary to thrombocytopenia; and  pancytopenia.  He was transfused 3 units PRBC and 1 unit of platelets.  He was scheduled for outpatient oncology f/u the following week, but it is not clear that this took place.  He did see rad onc yesterday and it does not appear that he was resumed on radiation therapy.  He was also hospitalized at [facility] from 3/29-4/2 for symptomatic anemia, pancytopenia, and metastatic prostate cancer.  He received 5 units PRBC during that hospitalization. His discharge CBC was: WBC 2.3, ANC 600, Hgb 8.1, Platelets 45k.  ED Course: code sepsis - applied cooling blanket for fever, Zosyn/Vanc, uncertain source of infection  Review of Systems: As per HPI; otherwise review of systems reviewed and negative.   Ambulatory Status:  "Can't walk too good", uses a walker  HOSPITAL COURSE:   Patient was admitted into the hospital, and [facility] and urine cultures were obtained, he as started on IV Van/Zosyn empirically for possible sepsis.  His Lactic acids were trended, and did improved.  He did not have any leukocytosis, and felt significantly better.  His BC and urine cultures remained sterile, and his antibiotics were not continued at discharge.  His MRI of the back, however, showed progression of his known metastatic disease.  He had no spinal cord compression syndrome, and his pain was controlled.  Offered for oncology consultation here at [facility], however, patient preferred to see his oncologist at the [facility], as he has been treated for his metastatic prostate cancer for the past year.  HHA was  arranged for him for assistance at home, He will be discharged to home to his brother.  Thank you and Good Day.   Discharge Diagnoses:  Principal Problem:   Sepsis ([place]) Active Problems:   Prostate cancer metastatic to bone (HCC)   Protein-calorie malnutrition, severe   Thrombocytopenia (HCC)   Elevated LFTs    Discharge Instructions  Discharge Instructions    Diet - low sodium heart healthy    Complete by:  As directed     Discharge instructions    Complete by:  As directed    Follow up with your oncologist at the [facility] next week.   Face-to-face encounter (required for Medicare/Medicaid patients)    Complete by:  As directed    I Ryan Ware certify that this patient is under my care and that I, or a nurse practitioner or physician's assistant working with me, had a face-to-face encounter that meets the physician face-to-face encounter requirements with this patient on 02/27/2017. The encounter with the patient was in whole, or in part for the following medical condition(s) which is the primary reason for home health care (List medical condition):  Back pain with worsening of the metastatic prostate cancer to the spine.   The encounter with the patient was in whole, or in part, for the following medical condition, which is the primary reason for home health care:  back pain   I certify that, based on my findings, the following services are medically necessary home health services:  Nursing   Reason for Medically Necessary Home Health Services:  Other See Comments   My clinical findings support the need for the above services:  OTHER SEE COMMENTS   Further, I certify that my clinical findings support that this patient is homebound due to:  Can transfer bed to chair only   Home Health    Complete by:  As directed    To provide the following care/treatments:  [place]   Increase activity slowly    Complete by:  As directed      Allergies as of 02/27/2017      Reactions   Acetaminophen Other (See Comments)   Liver problems   Ibuprofen    Liver conditions      Medication List    STOP taking these medications   oxymetazoline 0.05 % nasal spray Commonly known as:  AFRIN     TAKE these medications   degarelix 120 MG injection Commonly known as:  FIRMAGON Inject 240 mg into the skin every 6 (six) months.   denosumab 60 MG/ML Soln injection Commonly known as:  PROLIA Inject 60 mg into the skin every 6  (six) months. Administer in upper arm, thigh, or abdomen   docusate sodium 100 MG capsule Commonly known as:  COLACE Take 100 mg by mouth daily.   folic acid 1 MG tablet Commonly known as:  FOLVITE Take 1 mg by mouth daily.   multivitamin with minerals Tabs tablet Take 1 tablet by mouth daily.   oxyCODONE 15 MG immediate release tablet Commonly known as:  ROXICODONE Take 15 mg by mouth every 6 (six) hours as needed for pain.   pantoprazole 40 MG tablet Commonly known as:  PROTONIX Take 1 tablet (40 mg total) by mouth daily.   polyethylene glycol packet Commonly known as:  MIRALAX / GLYCOLAX Take 17 g by mouth daily as needed.   sodium chloride 0.65 % Soln nasal spray Commonly known as:  OCEAN Place 1 spray into both nostrils as  needed for congestion.       Allergies  Allergen Reactions  . Acetaminophen Other (See Comments)    Liver problems  . Ibuprofen     Liver conditions    Consultations:  None.    Procedures/Studies: Dg Chest 2 View  Result Date: 02/25/2017 CLINICAL DATA:  Back and chest pain for 3 days. EXAM: CHEST  2 VIEW COMPARISON:  Chest CT 11/14/2016 FINDINGS: The cardiac silhouette, mediastinal and hilar contours are within normal limits and stable. There is mild tortuosity and ectasia of the thoracic aorta. Low lung volumes with vascular crowding and streaky atelectasis but no definite infiltrates or effusions. Diffuse osteoblastic metastatic prostate cancer again demonstrated. No obvious pathologic fracture. IMPRESSION: No acute cardiopulmonary findings. Diffuse osseous metastatic disease without obvious pathologic compression fracture in the thoracic spine. Electronically Signed   By: Marijo Sanes M.D.   On: 02/25/2017 15:48   Dg Lumbar Spine Complete  Result Date: 02/25/2017 CLINICAL DATA:  Back pain for 3 days. Prostate cancer with bone metastasis. Receiving radiation therapy. EXAM: LUMBAR SPINE - COMPLETE 4+ VIEW COMPARISON:  Radiograph 01/28/2017  FINDINGS: There is sclerosis of the vertebral bodies. No acute loss vertebral body height or disc height. Endplate spurring at V2-Z3. No pars fracture. IMPRESSION: 1. No acute findings lumbar spine. 2. Disc osteophytic disease from L4-S1. 3. Diffuse bone sclerotic metastasis. Electronically Signed   By: Suzy Bouchard M.D.   On: 02/25/2017 15:47   Mr Lumbar Spine W Wo Contrast (assess For Abscess, Cord Compression)  Result Date: 02/25/2017 CLINICAL DATA:  Initial evaluation for acute back pain for 3 days. History of metastatic prostate cancer with osseous metastases. Currently undergoing radiation. EXAM: MRI LUMBAR SPINE WITHOUT AND WITH CONTRAST TECHNIQUE: Multiplanar and multiecho pulse sequences of the lumbar spine were obtained without and with intravenous contrast. CONTRAST:  71mL MULTIHANCE GADOBENATE DIMEGLUMINE 529 MG/ML IV SOLN COMPARISON:  Comparison made with previous MRI from 12/18/2016. FINDINGS: Segmentation: Normal segmentation. Lowest well-formed disc is labeled the L5-S1 level. Alignment: Normal alignment with preservation of the normal lumbar lordosis. No listhesis. Vertebrae: Diffuse osseous metastases seen throughout the visualized bone marrow. Overall, changes appears somewhat progressed relative to recent MRI. Vertebral body heights are maintained without associated vertebral body fracture. Extensive metastases with expansile tumor enhancement present within the visualize sacrum, greater on the right. Would be extremely difficult to exclude a superimposed pathologic fracture at this level. This is incompletely evaluated on this exam. No other associated pathologic fracture. No other significant extra osseous extension of tumor identified. Conus medullaris: Extends to the L2 level and appears normal. Paraspinal and other soft tissues: Paraspinous soft tissues demonstrate no acute abnormality. Visualized visceral structures within normal limits. Left retroperitoneal/iliac lymph nodes  measure up to 17 mm in short axis (series 9, image 28). Disc levels: Diffuse congenital shortening of the pedicles. L1-2: Normal disc. Bilateral facet degeneration. No significant canal or foraminal stenosis. L2-3: Diffuse degenerative disc bulge with disc desiccation and intervertebral disc space narrowing. Epidural lipomatosis. Short pedicles. Bilateral facet degeneration with ligamentum flavum hypertrophy. Stable severe canal stenosis. Thecal sac measures approximately 7 mm in AP diameter. No significant foraminal encroachment. L3-4: Mild disc bulge. Epidural lipomatosis. Short pedicles. Bilateral facet hypertrophy. Resultant severe canal stenosis with the thecal sac measuring 7 mm in AP diameter, similar to previous. No significant foraminal encroachment. L4-5: Left E centric disc bulge. Left greater than right facet arthrosis. Short pedicles with epidural lipomatosis. Resultant severe canal stenosis with the thecal sac measuring 7 mm in  AP diameter. Changes relatively stable. No significant foraminal encroachment. L5-S1: Chronic diffuse degenerative disc osteophyte with intervertebral disc space narrowing. Broad posterior component indents the ventral thecal sac, encroaching upon the bilateral lateral recesses. Moderate to advanced bilateral facet arthrosis, worse on the right. Resultant severe subarticular stenosis bilaterally with moderate canal narrowing. Moderate right with mild left foraminal stenosis. Changes relatively stable. IMPRESSION: 1. Diffuse osseous metastatic disease, overall progressed/worsened in appearance relative to prior MRI from 12/18/2016. No associated pathologic fracture within the lumbar spine. Would be extremely difficult to exclude a superimposed pathologic fracture at the level the sacrum. 2. 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 Overall, changes are stable  from previous. 3. Left retroperitoneal/iliac adenopathy as above, concerning for nodal metastatic disease. Electronically Signed   By: Jeannine Boga M.D.   On: 02/25/2017 19:12       Subjective: My pain is controlled.   Discharge Exam: Vitals:   02/26/17 2118 02/27/17 0700  BP: 119/75 120/72  Pulse: 96 100  Resp: 18 20  Temp: 99 F (37.2 C) 98.9 F (37.2 C)   Vitals:   02/25/17 2042 02/26/17 0627 02/26/17 2118 02/27/17 0700  BP: 122/68 126/71 119/75 120/72  Pulse: (!) 107 (!) 116 96 100  Resp: 20 (!) 21 18 20   Temp:  100 F (37.8 C) 99 F (37.2 C) 98.9 F (37.2 C)  TempSrc:  Oral Oral Oral  SpO2: 98% 96% 99% 100%  Weight: 67.5 kg (148 lb 13 oz)     Height: 6' 5.5" (1.969 m)       General: Pt is alert, awake, not in acute distress Cardiovascular: RRR, S1/S2 +, no rubs, no gallops Respiratory: CTA bilaterally, no wheezing, no rhonchi Abdominal: Soft, NT, ND, bowel sounds + Extremities: no edema, no cyanosis    The results of significant diagnostics from this hospitalization (including imaging, microbiology, ancillary and laboratory) are listed below for reference.     Microbiology: Recent Results (from the past 240 hour(s))  Culture, blood (Routine x 2)     Status: None (Preliminary result)   Collection Time: 02/25/17  2:40 PM  Result Value Ref Range Status   Specimen Description BLOOD RIGHT FOREARM  Final   Special Requests   Final    BOTTLES DRAWN AEROBIC AND ANAEROBIC Blood Culture adequate volume   Culture NO GROWTH 2 DAYS  Final   Report Status PENDING  Incomplete  Culture, blood (Routine x 2)     Status: None (Preliminary result)   Collection Time: 02/25/17  2:45 PM  Result Value Ref Range Status   Specimen Description BLOOD LEFT FOREARM  Final   Special Requests   Final    BOTTLES DRAWN AEROBIC AND ANAEROBIC Blood Culture adequate volume   Culture NO GROWTH 2 DAYS  Final   Report Status PENDING  Incomplete  Urine culture     Status: None    Collection Time: 02/25/17  2:58 PM  Result Value Ref Range Status   Specimen Description URINE, CLEAN CATCH  Final   Special Requests NONE  Final   Culture   Final    NO GROWTH Performed at [facility], [place] [address], [place], [place] [address]    Report Status 02/26/2017 FINAL  Final     Basic Metabolic Panel:  Recent Labs Lab 02/25/17 1440 02/26/17 0521  NA 133* 132*  K 3.7 3.8  CL 98* 101  CO2 24 24  GLUCOSE 110* 99  BUN 13 12  CREATININE 0.70 0.51*  CALCIUM 9.3 8.7*   Liver Function Tests:  Recent Labs Lab 02/25/17 1440  AST 120*  ALT 53  ALKPHOS 308*  BILITOT 1.9*  PROT 7.0  ALBUMIN 3.2*   CBC:  Recent Labs Lab 02/25/17 1440 02/26/17 0521  WBC 8.4 5.7  NEUTROABS 3.9  --   HGB 10.8* 9.0*  HCT 33.3* 27.2*  MCV 89.8 89.2  PLT 66* 51*   Urinalysis    Component Value Date/Time   COLORURINE AMBER (A) 02/25/2017 1440   APPEARANCEUR HAZY (A) 02/25/2017 1440   LABSPEC 1.024 02/25/2017 1440   PHURINE 5.0 02/25/2017 1440   GLUCOSEU NEGATIVE 02/25/2017 1440   HGBUR SMALL (A) 02/25/2017 1440   BILIRUBINUR NEGATIVE 02/25/2017 1440   KETONESUR NEGATIVE 02/25/2017 1440   PROTEINUR NEGATIVE 02/25/2017 1440   NITRITE NEGATIVE 02/25/2017 1440   LEUKOCYTESUR NEGATIVE 02/25/2017 1440   Microbiology Recent Results (from the past 240 hour(s))  Culture, blood (Routine x 2)     Status: None (Preliminary result)   Collection Time: 02/25/17  2:40 PM  Result Value Ref Range Status   Specimen Description BLOOD RIGHT FOREARM  Final   Special Requests   Final    BOTTLES DRAWN AEROBIC AND ANAEROBIC Blood Culture adequate volume   Culture NO GROWTH 2 DAYS  Final   Report Status PENDING  Incomplete  Culture, blood (Routine x 2)     Status: None (Preliminary result)   Collection Time: 02/25/17  2:45 PM  Result Value Ref Range Status   Specimen Description BLOOD LEFT FOREARM  Final   Special Requests   Final    BOTTLES DRAWN AEROBIC AND ANAEROBIC Blood  Culture adequate volume   Culture NO GROWTH 2 DAYS  Final   Report Status PENDING  Incomplete  Urine culture     Status: None   Collection Time: 02/25/17  2:58 PM  Result Value Ref Range Status   Specimen Description URINE, CLEAN CATCH  Final   Special Requests NONE  Final   Culture   Final    NO GROWTH Performed at [facility], [place] [address] [place], [place] [address]    Report Status 02/26/2017 FINAL  Final     Time coordinating discharge: Over 30 minutes  SIGNED:  Orvan Falconer, MD FACP [facility] 02/27/2017, 12:19 PM   If 7PM-7AM, please contact night-coverage [email or web address] Password [email or web address]

## 2017-02-27 NOTE — Progress Notes (Signed)
Pharmacy Antibiotic Note  Ryan Ware is a 66 y.o. male admitted on 02/25/2017 with sepsis.  Pharmacy has been consulted for vancomycin and zosyn dosing.  Micro (-) to date, now afebrile.  No spinal abscess noted.  Plan: Cont Vanc 100 mg IV q12  Cont zosyn 3.375 gm IV q8 hours. EI f/u renal function, cultures and clinical course Narrow antibiotics vs d/c soon. If therapy continued will need to check trough level this weekend.  Height: 6' 5.5" (196.9 cm) Weight: 148 lb 13 oz (67.5 kg) IBW/kg (Calculated) : 90.25  Temp (24hrs), Avg:99 F (37.2 C), Min:98.9 F (37.2 C), Max:99 F (37.2 C)   Recent Labs Lab 02/25/17 1440  02/25/17 1512 02/25/17 1910 02/25/17 2149 02/26/17 0009 02/26/17 0521  WBC 8.4  --   --   --   --   --  5.7  CREATININE 0.70  --   --   --   --   --  0.51*  LATICACIDVEN  --   < > 2.51* 1.7 1.8 2.2* 1.3  < > = values in this interval not displayed.  Estimated Creatinine Clearance: 89.1 mL/min (A) (by C-G formula based on SCr of 0.51 mg/dL (L)).    Allergies  Allergen Reactions  . Acetaminophen Other (See Comments)    Liver problems  . Ibuprofen     Liver conditions   Antimicrobials this admission:  Vanc 4/11>> Zosyn 4/11>>  Dose adjustments this admission:  n/a  Microbiology results:  4/11 BCx: pending 4/11 UCx: pending  Thank you for allowing pharmacy to be a part of this patient's care.  Pricilla Larsson 02/27/2017 3:42 PM

## 2017-02-27 NOTE — Progress Notes (Signed)
All questions answered for patient and family and all verbalize understanding. Pt escorted to car in wheelchair by NT.

## 2017-02-27 NOTE — Progress Notes (Signed)
Discharge instructions read to patient.  Patient verbalized understanding of all instructions. 

## 2017-03-02 LAB — CULTURE, BLOOD (ROUTINE X 2)
Culture: NO GROWTH
Culture: NO GROWTH
SPECIAL REQUESTS: ADEQUATE
SPECIAL REQUESTS: ADEQUATE

## 2017-03-03 ENCOUNTER — Telehealth: Payer: Self-pay | Admitting: *Deleted

## 2017-03-03 NOTE — Telephone Encounter (Signed)
CALLED PATIENT TO ALTER FU VISIT ON 03-05-17 , NEW TIME - 9:30 AM ON 03-05-17, LVM FOR A RETURN CALL

## 2017-03-05 ENCOUNTER — Ambulatory Visit: Payer: Self-pay | Admitting: Urology

## 2017-03-09 ENCOUNTER — Emergency Department (HOSPITAL_COMMUNITY): Payer: BLUE CROSS/BLUE SHIELD

## 2017-03-09 ENCOUNTER — Inpatient Hospital Stay (HOSPITAL_COMMUNITY)
Admission: EM | Admit: 2017-03-09 | Discharge: 2017-03-12 | DRG: 722 | Disposition: A | Discharge status: Home Health | Payer: BLUE CROSS/BLUE SHIELD | Attending: Internal Medicine | Admitting: Internal Medicine

## 2017-03-09 ENCOUNTER — Encounter (HOSPITAL_COMMUNITY): Payer: Self-pay | Admitting: Emergency Medicine

## 2017-03-09 DIAGNOSIS — I639 Cerebral infarction, unspecified: Secondary | ICD-10-CM

## 2017-03-09 DIAGNOSIS — Z883 Allergy status to other anti-infective agents status: Secondary | ICD-10-CM | POA: Diagnosis not present

## 2017-03-09 DIAGNOSIS — F17210 Nicotine dependence, cigarettes, uncomplicated: Secondary | ICD-10-CM | POA: Diagnosis present

## 2017-03-09 DIAGNOSIS — A419 Sepsis, unspecified organism: Secondary | ICD-10-CM | POA: Diagnosis not present

## 2017-03-09 DIAGNOSIS — G9340 Encephalopathy, unspecified: Secondary | ICD-10-CM | POA: Diagnosis present

## 2017-03-09 DIAGNOSIS — Z923 Personal history of irradiation: Secondary | ICD-10-CM

## 2017-03-09 DIAGNOSIS — C7951 Secondary malignant neoplasm of bone: Secondary | ICD-10-CM | POA: Diagnosis present

## 2017-03-09 DIAGNOSIS — D696 Thrombocytopenia, unspecified: Secondary | ICD-10-CM | POA: Diagnosis present

## 2017-03-09 DIAGNOSIS — Z515 Encounter for palliative care: Secondary | ICD-10-CM | POA: Diagnosis not present

## 2017-03-09 DIAGNOSIS — D638 Anemia in other chronic diseases classified elsewhere: Secondary | ICD-10-CM | POA: Diagnosis present

## 2017-03-09 DIAGNOSIS — Z681 Body mass index (BMI) 19 or less, adult: Secondary | ICD-10-CM

## 2017-03-09 DIAGNOSIS — Z66 Do not resuscitate: Secondary | ICD-10-CM | POA: Diagnosis present

## 2017-03-09 DIAGNOSIS — R7989 Other specified abnormal findings of blood chemistry: Secondary | ICD-10-CM | POA: Diagnosis not present

## 2017-03-09 DIAGNOSIS — F17200 Nicotine dependence, unspecified, uncomplicated: Secondary | ICD-10-CM | POA: Diagnosis not present

## 2017-03-09 DIAGNOSIS — Z79899 Other long term (current) drug therapy: Secondary | ICD-10-CM

## 2017-03-09 DIAGNOSIS — C61 Malignant neoplasm of prostate: Principal | ICD-10-CM | POA: Diagnosis present

## 2017-03-09 DIAGNOSIS — E43 Unspecified severe protein-calorie malnutrition: Secondary | ICD-10-CM | POA: Diagnosis present

## 2017-03-09 DIAGNOSIS — R64 Cachexia: Secondary | ICD-10-CM | POA: Diagnosis present

## 2017-03-09 DIAGNOSIS — R627 Adult failure to thrive: Secondary | ICD-10-CM | POA: Diagnosis present

## 2017-03-09 DIAGNOSIS — G894 Chronic pain syndrome: Secondary | ICD-10-CM | POA: Diagnosis present

## 2017-03-09 DIAGNOSIS — Z886 Allergy status to analgesic agent status: Secondary | ICD-10-CM

## 2017-03-09 DIAGNOSIS — Z7189 Other specified counseling: Secondary | ICD-10-CM | POA: Diagnosis not present

## 2017-03-09 DIAGNOSIS — R509 Fever, unspecified: Secondary | ICD-10-CM | POA: Diagnosis not present

## 2017-03-09 DIAGNOSIS — R945 Abnormal results of liver function studies: Secondary | ICD-10-CM

## 2017-03-09 DIAGNOSIS — B1920 Unspecified viral hepatitis C without hepatic coma: Secondary | ICD-10-CM | POA: Diagnosis present

## 2017-03-09 DIAGNOSIS — R4182 Altered mental status, unspecified: Secondary | ICD-10-CM | POA: Diagnosis not present

## 2017-03-09 DIAGNOSIS — Z8249 Family history of ischemic heart disease and other diseases of the circulatory system: Secondary | ICD-10-CM

## 2017-03-09 DIAGNOSIS — M545 Low back pain: Secondary | ICD-10-CM | POA: Diagnosis present

## 2017-03-09 LAB — COMPREHENSIVE METABOLIC PANEL
ALT: 43 U/L (ref 17–63)
AST: 117 U/L — ABNORMAL HIGH (ref 15–41)
Albumin: 2.9 g/dL — ABNORMAL LOW (ref 3.5–5.0)
Alkaline Phosphatase: 271 U/L — ABNORMAL HIGH (ref 38–126)
Anion gap: 11 (ref 5–15)
BUN: 20 mg/dL (ref 6–20)
CHLORIDE: 99 mmol/L — AB (ref 101–111)
CO2: 24 mmol/L (ref 22–32)
Calcium: 9.4 mg/dL (ref 8.9–10.3)
Creatinine, Ser: 0.84 mg/dL (ref 0.61–1.24)
GFR calc Af Amer: >60 mL/min (ref >60)
GFR calc non Af Amer: >60 mL/min (ref >60)
GLUCOSE: 102 mg/dL — AB (ref 65–99)
POTASSIUM: 3.7 mmol/L (ref 3.5–5.1)
SODIUM: 134 mmol/L — AB (ref 135–145)
Total Bilirubin: 2.2 mg/dL — ABNORMAL HIGH (ref 0.3–1.2)
Total Protein: 7.2 g/dL (ref 6.5–8.1)

## 2017-03-09 LAB — URINALYSIS, ROUTINE W REFLEX MICROSCOPIC
Bilirubin Urine: NEGATIVE
Glucose, UA: NEGATIVE mg/dL
Hgb urine dipstick: NEGATIVE
Ketones, ur: NEGATIVE mg/dL
Leukocytes, UA: NEGATIVE
Nitrite: NEGATIVE
PROTEIN: 30 mg/dL — AB
Specific Gravity, Urine: 1.021 (ref 1.005–1.030)
pH: 5 (ref 5.0–8.0)

## 2017-03-09 LAB — CBC WITH DIFFERENTIAL/PLATELET
Basophils Absolute: 0.1 10*3/uL (ref 0.0–0.1)
Basophils Relative: 1 %
Eosinophils Absolute: 0 10*3/uL (ref 0.0–0.7)
Eosinophils Relative: 0 %
HEMATOCRIT: 29.3 % — AB (ref 39.0–52.0)
Hemoglobin: 9.3 g/dL — ABNORMAL LOW (ref 13.0–17.0)
LYMPHS PCT: 11 %
Lymphs Abs: 0.9 10*3/uL (ref 0.7–4.0)
MCH: 28.9 pg (ref 26.0–34.0)
MCHC: 31.7 g/dL (ref 30.0–36.0)
MCV: 91 fL (ref 78.0–100.0)
MONO ABS: 1.6 10*3/uL — AB (ref 0.1–1.0)
MONOS PCT: 18 %
NEUTROS ABS: 6 10*3/uL (ref 1.7–7.7)
Neutrophils Relative %: 70 %
Platelets: 96 10*3/uL — ABNORMAL LOW (ref 150–400)
RBC: 3.22 MIL/uL — ABNORMAL LOW (ref 4.22–5.81)
RDW: 19.3 % — AB (ref 11.5–15.5)
WBC: 8.6 10*3/uL (ref 4.0–10.5)

## 2017-03-09 LAB — APTT: aPTT: 67 seconds — ABNORMAL HIGH (ref 24–36)

## 2017-03-09 LAB — PROCALCITONIN: PROCALCITONIN: 1.58 ng/mL

## 2017-03-09 LAB — TROPONIN I

## 2017-03-09 LAB — LACTIC ACID, PLASMA
LACTIC ACID, VENOUS: 2.3 mmol/L — AB (ref 0.5–1.9)
LACTIC ACID, VENOUS: 2.2 mmol/L — AB (ref 0.5–1.9)
Lactic Acid, Venous: 1.7 mmol/L (ref 0.5–1.9)

## 2017-03-09 LAB — I-STAT CG4 LACTIC ACID, ED: Lactic Acid, Venous: 2.55 mmol/L (ref 0.5–1.9)

## 2017-03-09 LAB — PROTIME-INR
INR: 1.75
Prothrombin Time: 20.7 seconds — ABNORMAL HIGH (ref 11.4–15.2)

## 2017-03-09 MED ORDER — ONDANSETRON HCL 4 MG PO TABS: 4.0000 mg | ORAL_TABLET | Freq: Four times a day (QID) | ORAL | Status: DC | PRN

## 2017-03-09 MED ORDER — MORPHINE SULFATE (PF) 2 MG/ML IV SOLN
2.0000 mg | INTRAVENOUS | Status: DC | PRN
  Administered 2017-03-10: 2 mg via INTRAVENOUS
  Filled 2017-03-09: qty 1

## 2017-03-09 MED ORDER — SODIUM CHLORIDE 0.9 % IV BOLUS (SEPSIS)
1000.0000 mL | Freq: Once | INTRAVENOUS | Status: AC
  Administered 2017-03-09: 1000 mL via INTRAVENOUS

## 2017-03-09 MED ORDER — PIPERACILLIN-TAZOBACTAM 3.375 G IVPB
3.3750 g | Freq: Three times a day (TID) | INTRAVENOUS | Status: DC
  Administered 2017-03-10 (×2): 3.375 g via INTRAVENOUS
  Filled 2017-03-09 (×2): qty 50

## 2017-03-09 MED ORDER — SODIUM CHLORIDE 0.9 % IV BOLUS (SEPSIS)
250.0000 mL | Freq: Once | INTRAVENOUS | Status: AC
  Administered 2017-03-09: 250 mL via INTRAVENOUS

## 2017-03-09 MED ORDER — ADULT MULTIVITAMIN W/MINERALS CH
1.0000 | ORAL_TABLET | Freq: Every day | ORAL | Status: DC
  Administered 2017-03-10 – 2017-03-12 (×3): 1 via ORAL
  Filled 2017-03-09 (×3): qty 1

## 2017-03-09 MED ORDER — FOLIC ACID 1 MG PO TABS
1.0000 mg | ORAL_TABLET | Freq: Every day | ORAL | Status: DC
  Administered 2017-03-10 – 2017-03-12 (×3): 1 mg via ORAL
  Filled 2017-03-09 (×3): qty 1

## 2017-03-09 MED ORDER — PANTOPRAZOLE SODIUM 40 MG PO TBEC
40.0000 mg | DELAYED_RELEASE_TABLET | Freq: Every day | ORAL | Status: DC
  Administered 2017-03-10 – 2017-03-12 (×3): 40 mg via ORAL
  Filled 2017-03-09 (×3): qty 1

## 2017-03-09 MED ORDER — ACETAMINOPHEN 325 MG PO TABS: 650.0000 mg | ORAL_TABLET | Freq: Four times a day (QID) | ORAL | Status: DC | PRN

## 2017-03-09 MED ORDER — ENSURE ENLIVE PO LIQD
237.0000 mL | Freq: Two times a day (BID) | ORAL | Status: DC
  Administered 2017-03-10: 237 mL via ORAL

## 2017-03-09 MED ORDER — VANCOMYCIN HCL 10 G IV SOLR
1250.0000 mg | Freq: Once | INTRAVENOUS | Status: AC
  Administered 2017-03-09: 1250 mg via INTRAVENOUS
  Filled 2017-03-09: qty 1250

## 2017-03-09 MED ORDER — OXYCODONE HCL 5 MG PO TABS
15.0000 mg | ORAL_TABLET | Freq: Four times a day (QID) | ORAL | Status: DC | PRN
  Administered 2017-03-09 – 2017-03-11 (×2): 15 mg via ORAL
  Filled 2017-03-09 (×2): qty 3

## 2017-03-09 MED ORDER — ACETAMINOPHEN 650 MG RE SUPP: 650.0000 mg | Freq: Four times a day (QID) | RECTAL | Status: DC | PRN

## 2017-03-09 MED ORDER — PIPERACILLIN-TAZOBACTAM 3.375 G IVPB 30 MIN
3.3750 g | Freq: Once | INTRAVENOUS | Status: DC
Start: 2017-03-09 — End: 2017-03-09

## 2017-03-09 MED ORDER — DOCUSATE SODIUM 100 MG PO CAPS
100.0000 mg | ORAL_CAPSULE | Freq: Two times a day (BID) | ORAL | Status: DC
  Administered 2017-03-09 – 2017-03-12 (×5): 100 mg via ORAL
  Filled 2017-03-09 (×6): qty 1

## 2017-03-09 MED ORDER — VANCOMYCIN HCL IN DEXTROSE 1-5 GM/200ML-% IV SOLN: 1000.0000 mg | Freq: Once | INTRAVENOUS | Status: DC

## 2017-03-09 MED ORDER — PIPERACILLIN-TAZOBACTAM 3.375 G IVPB 30 MIN
3.3750 g | Freq: Once | INTRAVENOUS | Status: AC
  Administered 2017-03-09: 3.375 g via INTRAVENOUS
  Filled 2017-03-09: qty 50

## 2017-03-09 MED ORDER — ONDANSETRON HCL 4 MG/2ML IJ SOLN: 4.0000 mg | Freq: Four times a day (QID) | INTRAMUSCULAR | Status: DC | PRN

## 2017-03-09 MED ORDER — LACTATED RINGERS IV SOLN
INTRAVENOUS | Status: DC
  Administered 2017-03-09 – 2017-03-12 (×5): via INTRAVENOUS

## 2017-03-09 MED ORDER — POLYETHYLENE GLYCOL 3350 17 G PO PACK: 17.0000 g | PACK | Freq: Every day | ORAL | Status: DC | PRN

## 2017-03-09 MED ORDER — VANCOMYCIN HCL IN DEXTROSE 750-5 MG/150ML-% IV SOLN
750.0000 mg | Freq: Three times a day (TID) | INTRAVENOUS | Status: DC
  Administered 2017-03-10 (×2): 750 mg via INTRAVENOUS
  Filled 2017-03-09 (×6): qty 150

## 2017-03-09 NOTE — Progress Notes (Signed)
Pharmacy Antibiotic Note  Ryan Ware is a 66 y.o. male admitted on 03/09/2017 with sepsis.  Pharmacy has been consulted for Vancomycin and Zosyn dosing.  Plan: Vancomycin 1250 mg IV loading dose, then Vancomycin 750 IV every 8 hours.  Goal trough 15-20 mcg/mL. Zosyn 3.375g IV q8h (4 hour infusion).  Labs per protocol  Height: 6' 5.5" (196.9 cm) Weight: 149 lb (67.6 kg) IBW/kg (Calculated) : 90.25  Temp (24hrs), Avg:101.1 F (38.4 C), Min:99.5 F (37.5 C), Max:102.6 F (39.2 C)   Recent Labs Lab 03/09/17 1522 03/09/17 1551  WBC 8.6  --   CREATININE 0.84  --   LATICACIDVEN  --  2.55*    Estimated Creatinine Clearance: 84.9 mL/min (by C-G formula based on SCr of 0.84 mg/dL).    Allergies  Allergen Reactions  . Acetaminophen Other (See Comments)    Liver problems  . Ibuprofen     Liver conditions    Antimicrobials this admission: Vancomycin 4/23 >>  Zosyn 4/23 >>   Dose adjustments this admission:     Thank you for allowing pharmacy to be a part of this patient's care.  Chriss Czar 03/09/2017 4:26 PM

## 2017-03-09 NOTE — ED Notes (Signed)
Nurse unavailable for report.  

## 2017-03-09 NOTE — ED Notes (Signed)
To x-ray

## 2017-03-09 NOTE — ED Notes (Signed)
In MRI

## 2017-03-09 NOTE — ED Triage Notes (Signed)
PT family reports altered mental status with strong urinary smell x2 days. Family reports pt was seen at [place] office today with fever of 100.2 and was told to come to an ED for further evaluation. PT reports pt has prostate cancer as well.

## 2017-03-09 NOTE — H&P (Addendum)
History and Physical    Ryan Ware DXI:[id number] DOB: 06/28/1951 DOA: 03/09/2017  PCP: [facility] Consultants:  Tammi Klippel - rad onc; Leilani Merl - oncology ([place]); McKenzie - urology Patient coming from: home - lives alone; Garvin: brother, [phone] Chief Complaint: fever  HPI: Ryan Ware is a 66 y.o. 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  During that hospitalization, his evaluation for sepsis was negative and he was found to have worsening metastatic disease in his spine; he was offered oncology consultation but preferred to go to the [place] instead.  When asked why he presented today (he was unaccompanied at the time of my evaluation), he replied "I don't know."  + fever.  Started feeling bad early this AM.  "I hurt all the time."  Nothing was really different, just didn't feel well.  "I can't remember."  +weakness of both arms and legs, no specific finding.  No dysphagia.  No dysarhtria.  Slight chest pain.  No cough.  No abdominal pain or n'v/d.  HPI per Dr. Thurnell Garbe: Pt was seen at 1505. Per pt and his family, c/o gradual onset and persistence of constant "confusion" for the past 2 days. Has been associated with "strong urine smell." Family states they went to [place] office today to have blood work completed, when pt's son asked the provider there to "check him out." Provider at [facility] office told pt's family he "had a fever to 100.2" and "his heart rate was racing." States they were told to bring him to the ED "to get admitted when he has a fever because his WBC is low." Pt's only complaint is his usual chronic LBP and "I might have a cough." Denies any change in his usual pain pattern. Denies focal motor weakness, no tingling/numbness in extremities, no  CP/SOB, no abd pain, no N/V/D. 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 LD XRT approximately 1 month ago, no chemotherapy.    ED Course: code sepsis - applied cooling blanket for fever, Zosyn/Vanc, uncertain source of infection.  Head CT with ?small new acute infarct so MRI ordered  Review of Systems: As per HPI; otherwise review of systems reviewed and negative.   Ambulatory Status:  "Can't walk too good", uses a walker  Past Medical History:  Diagnosis Date  . Anemia   . Bone cancer ([place])    prostate ca with spine mets  . Elevated LFTs   . Hepatitis C   . Prostate cancer ([place])   . Thrombocytopenia ([place])     Past Surgical History:  Procedure Laterality Date  . HAND SURGERY    . PROSTATE BIOPSY      Social History   Social History  . Marital status: Single    Spouse name: N/A  . Number of children: N/A  . Years of education: N/A   Occupational History  . disabled    Social History Main Topics  . Smoking status: Current Every Day Smoker    Packs/day: 0.50    Years: 54.00    Types: Cigarettes  . Smokeless tobacco: Never Used  . Alcohol use No     Comment: Pt states he use to drink and last year was his last drink  . Drug use: No     Comment: remote h/o use, last use  about a year ago  . Sexual activity: No   Other Topics Concern  . Not on file   Social History Narrative  . No narrative on file    Allergies  Allergen Reactions  . Acetaminophen Other (See Comments)    Liver problems  . Ibuprofen     Liver conditions    Family History  Problem Relation Age of Onset  . Hypertension Mother   . Osteoarthritis Father   . Cancer Neg Hx     Prior to Admission medications   Medication Sig Start Date End Date Taking? Authorizing Provider  degarelix (FIRMAGON) 120 MG injection Inject 240 mg into the skin every 6 (six) months.    Yes Historical Provider, MD  denosumab (PROLIA) 60 MG/ML SOLN injection Inject 60 mg into the skin every 6 (six) months.  Administer in upper arm, thigh, or abdomen   Yes Historical Provider, MD  docusate sodium (COLACE) 100 MG capsule Take 100 mg by mouth daily.    Yes Historical Provider, MD  folic acid (FOLVITE) 1 MG tablet Take 1 mg by mouth daily.   Yes Historical Provider, MD  Multiple Vitamin (MULTIVITAMIN WITH MINERALS) TABS tablet Take 1 tablet by mouth daily.   Yes Historical Provider, MD  oxyCODONE (ROXICODONE) 15 MG immediate release tablet Take 15 mg by mouth every 6 (six) hours as needed for pain.   Yes Historical Provider, MD  pantoprazole (PROTONIX) 40 MG tablet Take 1 tablet (40 mg total) by mouth daily. 01/31/17  Yes Samuella Cota, MD  polyethylene glycol [facility]) packet Take 17 g by mouth daily as needed.    Historical Provider, MD  sodium chloride (OCEAN) 0.65 % SOLN nasal spray Place 1 spray into both nostrils as needed for congestion.    Historical Provider, MD    Physical Exam: Vitals:   03/09/17 1800 03/09/17 1818 03/09/17 1830 03/09/17 1934  BP: 128/72  138/85 (!) 146/78  Pulse:    (!) 109  Resp:   (!) 26 20  Temp:  100 F (37.8 C)  97.6 F (36.4 C)  TempSrc:  Oral  Oral  SpO2:    97%  Weight:    65.1 kg (143 lb 8 oz)  Height:         General: Appears calm and comfortable and is NAD.  Appears chronically ill. Eyes:  PERRL, EOMI, normal lids, iris ENT:  grossly normal hearing, lips & tongue, mmm Neck:  no LAD, masses or thyromegaly Cardiovascular:  Tachycardia, no m/r/g. No LE edema.  Respiratory:  CTA bilaterally, no w/r/r. Normal respiratory effort. Abdomen:  soft, ntnd, NABS Skin:  no rash or induration seen on limited exam Musculoskeletal:  grossly normal tone BUE/BLE, good ROM, no bony abnormality Psychiatric:  grossly normal mood and affect, speech fluent and appropriate, AOx3 Neurologic:  CN 2-12 grossly intact, moves all extremities in coordinated fashion, sensation intact  Labs on Admission: I have personally reviewed following labs and imaging  studies  CBC:  Recent Labs Lab 03/09/17 1522  WBC 8.6  NEUTROABS 6.0  HGB 9.3*  HCT 29.3*  MCV 91.0  PLT 96*   Basic Metabolic Panel:  Recent Labs Lab 03/09/17 1522  NA 134*  K 3.7  CL 99*  CO2 24  GLUCOSE 102*  BUN 20  CREATININE 0.84  CALCIUM 9.4   GFR: Estimated Creatinine Clearance: 81.8 mL/min (by C-G formula based on SCr of 0.84 mg/dL). Liver Function Tests:  Recent Labs Lab 03/09/17 1522  AST  117*  ALT 43  ALKPHOS 271*  BILITOT 2.2*  PROT 7.2  ALBUMIN 2.9*   No results for input(s): LIPASE, AMYLASE in the last 168 hours. No results for input(s): AMMONIA in the last 168 hours. Coagulation Profile:  Recent Labs Lab 03/09/17 2007  INR 1.75   Cardiac Enzymes:  Recent Labs Lab 03/09/17 1522  TROPONINI <0.03   BNP (last 3 results) No results for input(s): PROBNP in the last 8760 hours. HbA1C: No results for input(s): HGBA1C in the last 72 hours. CBG: No results for input(s): GLUCAP in the last 168 hours. Lipid Profile: No results for input(s): CHOL, HDL, LDLCALC, TRIG, CHOLHDL, LDLDIRECT in the last 72 hours. Thyroid Function Tests: No results for input(s): TSH, T4TOTAL, FREET4, T3FREE, THYROIDAB in the last 72 hours. Anemia Panel: No results for input(s): VITAMINB12, FOLATE, FERRITIN, TIBC, IRON, RETICCTPCT in the last 72 hours. Urine analysis:    Component Value Date/Time   COLORURINE AMBER (A) 03/09/2017 1534   APPEARANCEUR HAZY (A) 03/09/2017 1534   LABSPEC 1.021 03/09/2017 1534   PHURINE 5.0 03/09/2017 1534   GLUCOSEU NEGATIVE 03/09/2017 1534   HGBUR NEGATIVE 03/09/2017 1534   BILIRUBINUR NEGATIVE 03/09/2017 1534   KETONESUR NEGATIVE 03/09/2017 1534   PROTEINUR 30 (A) 03/09/2017 1534   NITRITE NEGATIVE 03/09/2017 1534   LEUKOCYTESUR NEGATIVE 03/09/2017 1534    Creatinine Clearance: Estimated Creatinine Clearance: 81.8 mL/min (by C-G formula based on SCr of 0.84 mg/dL).  Sepsis  Labs: @LABRCNTIP (procalcitonin:4,lacticidven:4) ) Recent Results (from the past 240 hour(s))  Culture, blood (Routine x 2)     Status: None (Preliminary result)   Collection Time: 03/09/17  3:36 PM  Result Value Ref Range Status   Specimen Description RIGHT ANTECUBITAL  Final   Special Requests   Final    BOTTLES DRAWN AEROBIC AND ANAEROBIC Blood Culture adequate volume   Culture PENDING  Incomplete   Report Status PENDING  Incomplete  Culture, blood (Routine x 2)     Status: None (Preliminary result)   Collection Time: 03/09/17  3:43 PM  Result Value Ref Range Status   Specimen Description LEFT ANTECUBITAL  Final   Special Requests   Final    BOTTLES DRAWN AEROBIC AND ANAEROBIC Blood Culture adequate volume   Culture PENDING  Incomplete   Report Status PENDING  Incomplete     Radiological Exams on Admission: Dg Chest 2 View  Result Date: 03/09/2017 CLINICAL DATA:  Fevers EXAM: CHEST  2 VIEW COMPARISON:  02/25/2017 FINDINGS: Cardiac shadow is within normal limits. The lungs are well aerated bilaterally. Some mild left basilar atelectasis is seen with elevation of the left hemidiaphragm. No sizable effusion is seen. Scattered sclerotic areas are noted within the bony structures consistent with the given clinical history of prostate carcinoma with bony metastatic disease. IMPRESSION: New left basilar atelectasis. Electronically Signed   By: Inez Catalina M.D.   On: 03/09/2017 15:43   Ct Head Wo Contrast  Result Date: 03/09/2017 CLINICAL DATA:  Altered mental status and fever. History of prostate carcinoma EXAM: CT HEAD WITHOUT CONTRAST TECHNIQUE: Contiguous axial images were obtained from the base of the skull through the vertex without intravenous contrast. COMPARISON:  None. FINDINGS: Brain: There is age related volume loss. There is no intracranial mass, hemorrhage, extra-axial fluid collection, or midline shift. There is a focal area of decreased attenuation in the anterior limb of the  right internal capsule, concerning for a small acute infarct in this area. Elsewhere there is rather minimal small vessel disease  in the centra semiovale bilaterally. Vascular: No hyperdense vessel. There is calcification in each carotid siphon region. Skull: Bony calvarium appears intact. Sinuses/Orbits: There is mucosal thickening in the sphenoid sinus regions without air-fluid level. There is slight mucosal thickening in several ethmoid air cells. Other visualized paranasal sinuses are clear. Orbits appear symmetric bilaterally. Other: Visualized mastoid air cells are clear. 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 No other evidence of acute appearing infarct. There is age related volume loss with slight periventricular small vessel disease. No hemorrhage or mass effect. Areas of arterial vascular calcification noted. Areas of paranasal sinus disease evident. Electronically Signed   By: Lowella Grip III M.D.   On: 03/09/2017 16:33   Mr Brain Wo Contrast (neuro Protocol)  Result Date: 03/09/2017 CLINICAL DATA:  Febrile, altered mental status. Possible new infarct on recent head CT. History of metastatic prostate cancer. EXAM: MRI HEAD WITHOUT CONTRAST MRA HEAD WITHOUT CONTRAST TECHNIQUE: Sagittal T1, axial coronal diffusion weighted imaging of the brain and surrounding structures were obtained without intravenous contrast. Angiographic images of the head were obtained using MRA technique without contrast. COMPARISON:  None. FINDINGS: MRI HEAD FINDINGS- motion degraded examination, incomplete. Technologist reports the patient attempted to climb at of scanner twice. BRAIN: No reduced diffusion to suggest acute ischemia, hypercellular tumor or typical infection. No hydrocephalus. No mass effect. No abnormal extra-axial fluid collections. VASCULAR: Not assessed. SKULL AND UPPER CERVICAL SPINE: No abnormal sellar expansion. Craniocervical junction maintained.  Heterogeneous bone marrow signal global pole for metastatic disease given patient's history of prostate cancer SINUSES/ORBITS: Not assessed. OTHER: None. MRA HEAD FINDINGS- mildly motion degraded examination. ANTERIOR CIRCULATION: Normal flow related enhancement of the included cervical, petrous, cavernous and supraclinoid internal carotid arteries. Patent anterior communicating artery. Normal flow related enhancement of the anterior and middle cerebral arteries, including distal segments. No definite aneurysm though limited by patient motion. POSTERIOR CIRCULATION: Codominant vertebral artery's. Basilar artery is patent, with normal flow related enhancement of the main branch vessels. Normal flow related enhancement of the posterior cerebral arteries. Small bilateral posterior communicating arteries. No definite aneurysm though limited by patient motion. ANATOMIC VARIANTS: None. IMPRESSION: MRI HEAD: Limited 3 sequence MRI of the head without acute intracranial process. MRA HEAD: Mildly motion degraded examination without emergent large vessel occlusion or severe stenosis. Electronically Signed   By: Elon Alas M.D.   On: 03/09/2017 18:04   Mr Jodene Nam Head (cerebral Arteries)  Result Date: 03/09/2017 CLINICAL DATA:  Febrile, altered mental status. Possible new infarct on recent head CT. History of metastatic prostate cancer. EXAM: MRI HEAD WITHOUT CONTRAST MRA HEAD WITHOUT CONTRAST TECHNIQUE: Sagittal T1, axial coronal diffusion weighted imaging of the brain and surrounding structures were obtained without intravenous contrast. Angiographic images of the head were obtained using MRA technique without contrast. COMPARISON:  None. FINDINGS: MRI HEAD FINDINGS- motion degraded examination, incomplete. Technologist reports the patient attempted to climb at of scanner twice. BRAIN: No reduced diffusion to suggest acute ischemia, hypercellular tumor or typical infection. No hydrocephalus. No mass effect. No  abnormal extra-axial fluid collections. VASCULAR: Not assessed. SKULL AND UPPER CERVICAL SPINE: No abnormal sellar expansion. Craniocervical junction maintained. Heterogeneous bone marrow signal global pole for metastatic disease given patient's history of prostate cancer SINUSES/ORBITS: Not assessed. OTHER: None. MRA HEAD FINDINGS- mildly motion degraded examination. ANTERIOR CIRCULATION: Normal flow related enhancement of the included cervical, petrous, cavernous and supraclinoid internal carotid arteries. Patent anterior communicating artery. Normal flow related enhancement of the  anterior and middle cerebral arteries, including distal segments. No definite aneurysm though limited by patient motion. POSTERIOR CIRCULATION: Codominant vertebral artery's. Basilar artery is patent, with normal flow related enhancement of the main branch vessels. Normal flow related enhancement of the posterior cerebral arteries. Small bilateral posterior communicating arteries. No definite aneurysm though limited by patient motion. ANATOMIC VARIANTS: None. IMPRESSION: MRI HEAD: Limited 3 sequence MRI of the head without acute intracranial process. MRA HEAD: Mildly motion degraded examination without emergent large vessel occlusion or severe stenosis. Electronically Signed   By: Elon Alas M.D.   On: 03/09/2017 18:04    EKG: Independently reviewed.  Sinus tachycardia with rate 126; LVH, LAFB, no evidence of acute ischemia, NSCSLT  Assessment/Plan Principal Problem:   Sepsis ([place]) Active Problems:   Prostate cancer metastatic to bone (HCC)   Protein-calorie malnutrition, severe   Thrombocytopenia ([facility])   Elevated LFTs   Altered mental status, unspecified    Sepsis -Fever, tachycardia, tachypnea with elevated lactate to 2.3, 2.55, 2.2  -While awaiting blood cultures, this appears to be a preseptic condition.   -Sepsis protocol initiated;did not received IVF bolus due to lactate <4 -Uncertain source - UA is  negative, CXR negative -Blood and urine cultures pending. -Admit and monitor on telemetry  -Continue to treat with IV Vanc and Zosyn for now as per the undifferentiated sepsis protocol -Will trend lactate to ensure improvement and check procalcitonin -All that said, the patient had an almost identical presentation earlier this month (also admitted by me) and his evaluation was negative. This increases the likelihood that this sepsis physiology is caused by an alternate source.  In his case, that would likely be his progressive metastatic prostate cancer.  Metastatic prostate cancer -Appears to be poorly controlled/worsening -AP 308 on 4/11, currently 271, normal 1 year ago and progressively worsening -Patient appears to be a candidate for Hospice, but this may need to be up to his [place] oncologist -It does not appear that progress was made in this area since his last hospitalization.  As such, it may behoove [place] to have oncology evaluate the patient here and start the process of Hospice referral if appropriate since the patient is having recurrent admissions in a short period of time.  Malnutrition -Albumin 2.9, 3.2 on 4/11 -Nutrition consult  Elevated LFTs -AST 117/ALT 43 -Bilirubin 1.9 -INR 1.75 -Dr. Talbert Cage previously recommended outpatient GI f/u; it is not clear if this took place. -h/o Hep C, most recent quant was 1.7 million. -It appeared that a C/A/P CT was ordered, but there still does not appear to be a result in Care Everywhere.    Anemia/thrombocytopenia -Hgb 9.3, 9.0 on 4/12 -Platelets 96, 51 on 4/12 -Per Dr. Talbert Cage, recommendation is to hold platelets unless he is actively bleeding or has Plt <15k.  AMS -Uncertain baseline and there was no family to corroborate history during my evaluation -The patient was generally appropriate other than not knowing why he is here or being able to provide a good history -CT ordered by Dr. Thurnell Garbe which showed apparent new acute  infarct -There are no other symptoms of a stroke at all and the patient is chronically thrombocytopenic -MRI was ordered and was a very poor study due to patient compliance but did not show apparent lesion -He does not take BP meds and so concern for permissive HTN is moot -With his underlying condition including thrombocytopenia, will not order additional diagnosis and treatment at this time.    DVT prophylaxis:  SCDs Code  Status:  DNR - confirmed with patient Family Communication: None present Disposition Plan:  Home once clinically improved Consults called: None  Admission status: Admit - It is my clinical opinion that admission to INPATIENT is reasonable and necessary because this patient will require at least 2 midnights in the hospital to treat this condition based on the medical complexity of the problems presented.  Given the aforementioned information, the predictability of an adverse outcome is felt to be significant.  Karmen Bongo MD [facility]  If 7PM-7AM, please contact night-coverage [email or web address] Password [facility]  03/09/2017, 9:16 PM

## 2017-03-09 NOTE — ED Provider Notes (Signed)
[facility] DEPT Provider Note   CSN: [id number] Arrival date & time: 03/09/17  1433     History   Chief Complaint Chief Complaint  Patient presents with  . Fever    HPI Ryan Ware is a 66 y.o. male.   Fever      Pt was seen at 1505. Per pt and his family, c/o gradual onset and persistence of constant "confusion" for the past 2 days. Has been associated with "strong urine smell." Family states they went to [place] office today to have blood work completed, when pt's son asked the provider there to "check him out." Provider at [facility] office told pt's family he "had a fever to 100.2" and "his heart rate was racing." States they were told to bring him to the ED "to get admitted when he has a fever because his WBC is low." Pt's only complaint is his usual chronic LBP and "I might have a cough." Denies any change in his usual pain pattern. Denies focal motor weakness, no tingling/numbness in extremities, no CP/SOB, no abd pain, no N/V/D. 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 LD XRT approximately 1 month ago, no chemotherapy.    Past Medical History:  Diagnosis Date  . Anemia   . Bone cancer ([place])    prostate ca with spine mets  . Elevated LFTs   . Hepatitis C   . Prostate cancer ([place])   . Thrombocytopenia [facility])     Patient Active Problem List   Diagnosis Date Noted  . Sepsis ([place]) 02/25/2017  . Elevated LFTs 02/25/2017  . Thrombocytopenia ([place]) 01/28/2017  . Symptomatic anemia 01/28/2017  . Epistaxis 01/28/2017  . Pancytopenia ([place]) 01/28/2017  . Protein-calorie malnutrition, severe 12/20/2016  . Cord compression ([place]) 12/19/2016  . Prostate cancer metastatic to bone ([place]) 12/19/2016  . Hyponatremia 12/19/2016  . Hepatitis C without hepatic coma 12/19/2016    Past Surgical History:  Procedure Laterality Date  . HAND SURGERY    . PROSTATE BIOPSY         Home Medications    Prior to Admission medications   Medication Sig Start Date End  Date Taking? Authorizing Provider  degarelix (FIRMAGON) 120 MG injection Inject 240 mg into the skin every 6 (six) months.     Historical Provider, MD  denosumab (PROLIA) 60 MG/ML SOLN injection Inject 60 mg into the skin every 6 (six) months. Administer in upper arm, thigh, or abdomen    Historical Provider, MD  docusate sodium (COLACE) 100 MG capsule Take 100 mg by mouth daily.     Historical Provider, MD  folic acid (FOLVITE) 1 MG tablet Take 1 mg by mouth daily.    Historical Provider, MD  Multiple Vitamin (MULTIVITAMIN WITH MINERALS) TABS tablet Take 1 tablet by mouth daily.    Historical Provider, MD  oxyCODONE (ROXICODONE) 15 MG immediate release tablet Take 15 mg by mouth every 6 (six) hours as needed for pain.    Historical Provider, MD  pantoprazole (PROTONIX) 40 MG tablet Take 1 tablet (40 mg total) by mouth daily. 01/31/17   Samuella Cota, MD  polyethylene glycol [facility] Raveling) packet Take 17 g by mouth daily as needed.    Historical Provider, MD  sodium chloride (OCEAN) 0.65 % SOLN nasal spray Place 1 spray into both nostrils as needed for congestion.    Historical Provider, MD    Family History Family History  Problem Relation Age of Onset  . Hypertension Mother   .  Osteoarthritis Father   . Cancer Neg Hx     Social History Social History  Substance Use Topics  . Smoking status: Current Every Day Smoker    Packs/day: 0.50    Years: 54.00    Types: Cigarettes  . Smokeless tobacco: Never Used  . Alcohol use No     Allergies   Acetaminophen and Ibuprofen   Review of Systems Review of Systems  Constitutional: Positive for fever.  ROS: Statement: All systems negative except as marked or noted in the HPI; Constitutional: +fever and chills. ; ; Eyes: Negative for eye pain, redness and discharge. ; ; ENMT: Negative for ear pain, hoarseness, nasal congestion, sinus pressure and sore throat. ; ; Cardiovascular: Negative for chest pain, palpitations, diaphoresis,  dyspnea and peripheral edema. ; ; Respiratory: +cough. Negative for wheezing and stridor. ; ; Gastrointestinal: Negative for nausea, vomiting, diarrhea, abdominal pain, blood in stool, hematemesis, jaundice and rectal bleeding. . ; ; Genitourinary: +strong urine smell. Negative for dysuria, flank pain and hematuria. ; ; Musculoskeletal: Negative for back pain and neck pain. Negative for swelling and trauma.; ; Skin: Negative for pruritus, rash, abrasions, blisters, bruising and skin lesion.; ; Neuro: +confusion. Negative for headache, lightheadedness and neck stiffness. Negative for weakness, altered level of consciousness, extremity weakness, paresthesias, involuntary movement, seizure and syncope.        Physical Exam Updated Vital Signs BP 120/83 (BP Location: Right Arm)   Pulse (!) 137   Temp 99.5 F (37.5 C) (Oral)   Resp 20   Ht 6' 5.5" (1.969 m)   Wt 149 lb (67.6 kg)   SpO2 97%   BMI 17.44 kg/m   Physical Exam 1510: Physical examination:  Nursing notes reviewed; Vital signs and O2 SAT reviewed;  Constitutional: Thin, In no acute distress; Head:  Normocephalic, atraumatic; Eyes: EOMI, PERRL, No scleral icterus; ENMT: Mouth and pharynx normal, Mucous membranes dry; Neck: Supple, Full range of motion, No lymphadenopathy; Cardiovascular: Tachycardic rate and rhythm, No gallop; Respiratory: Breath sounds clear & equal bilaterally, No wheezes.  Speaking full sentences with ease, Normal respiratory effort/excursion; Chest: Nontender, Movement normal; Abdomen: Soft, Nontender, Nondistended, Normal bowel sounds; Genitourinary: No CVA tenderness; Extremities: Pulses normal, No tenderness, No edema, No calf edema or asymmetry.; Neuro: AA&Ox3, vague historian. Major CN grossly intact.  Speech clear. No gross focal motor or sensory deficits in extremities.; Skin: Color normal, Warm, Dry.   ED Treatments / Results  Labs (all labs ordered are listed, but only abnormal results are  displayed)   EKG  EKG Interpretation None       Radiology   Procedures Procedures (including critical care time)  Medications Ordered in ED Medications - No data to display   Initial Impression / Assessment and Plan / ED Course  I have reviewed the triage vital signs and the nursing notes.  Pertinent labs & imaging results that were available during my care of the patient were reviewed by me and considered in my medical decision making (see chart for details).  MDM Reviewed: previous chart, nursing note and vitals Reviewed previous: labs and ECG Interpretation: labs, ECG, x-ray and CT scan Total time providing critical care: 30-74 minutes. This excludes time spent performing separately reportable procedures and services. Consults: admitting MD   CRITICAL CARE Performed by: Alfonzo Feller Total critical care time: 35 minutes Critical care time was exclusive of separately billable procedures and treating other patients. Critical care was necessary to treat or prevent imminent or life-threatening deterioration. Critical care  was time spent personally by me on the following activities: development of treatment plan with patient and/or surrogate as well as nursing, discussions with consultants, evaluation of patient's response to treatment, examination of patient, obtaining history from patient or surrogate, ordering and performing treatments and interventions, ordering and review of laboratory studies, ordering and review of radiographic studies, pulse oximetry and re-evaluation of patient's condition.   Results for orders placed or performed during the hospital encounter of 03/09/17  Comprehensive metabolic panel  Result Value Ref Range   Sodium 134 (L) 135 - 145 mmol/L   Potassium 3.7 3.5 - 5.1 mmol/L   Chloride 99 (L) 101 - 111 mmol/L   CO2 24 22 - 32 mmol/L   Glucose, Bld 102 (H) 65 - 99 mg/dL   BUN 20 6 - 20 mg/dL   Creatinine, Ser 0.84 0.61 - 1.24 mg/dL    Calcium 9.4 8.9 - 10.3 mg/dL   Total Protein 7.2 6.5 - 8.1 g/dL   Albumin 2.9 (L) 3.5 - 5.0 g/dL   AST 117 (H) 15 - 41 U/L   ALT 43 17 - 63 U/L   Alkaline Phosphatase 271 (H) 38 - 126 U/L   Total Bilirubin 2.2 (H) 0.3 - 1.2 mg/dL   GFR calc non Af Amer >60 >60 mL/min   GFR calc Af Amer >60 >60 mL/min   Anion gap 11 5 - 15  CBC with Differential  Result Value Ref Range   WBC 8.6 4.0 - 10.5 K/uL   RBC 3.22 (L) 4.22 - 5.81 MIL/uL   Hemoglobin 9.3 (L) 13.0 - 17.0 g/dL   HCT 29.3 (L) 39.0 - 52.0 %   MCV 91.0 78.0 - 100.0 fL   MCH 28.9 26.0 - 34.0 pg   MCHC 31.7 30.0 - 36.0 g/dL   RDW 19.3 (H) 11.5 - 15.5 %   Platelets 96 (L) 150 - 400 K/uL   Neutrophils Relative % 70 %   Neutro Abs 6.0 1.7 - 7.7 K/uL   Lymphocytes Relative 11 %   Lymphs Abs 0.9 0.7 - 4.0 K/uL   Monocytes Relative 18 %   Monocytes Absolute 1.6 (H) 0.1 - 1.0 K/uL   Eosinophils Relative 0 %   Eosinophils Absolute 0.0 0.0 - 0.7 K/uL   Basophils Relative 1 %   Basophils Absolute 0.1 0.0 - 0.1 K/uL  Urinalysis, Routine w reflex microscopic  Result Value Ref Range   Color, Urine AMBER (A) YELLOW   APPearance HAZY (A) CLEAR   Specific Gravity, Urine 1.021 1.005 - 1.030   pH 5.0 5.0 - 8.0   Glucose, UA NEGATIVE NEGATIVE mg/dL   Hgb urine dipstick NEGATIVE NEGATIVE   Bilirubin Urine NEGATIVE NEGATIVE   Ketones, ur NEGATIVE NEGATIVE mg/dL   Protein, ur 30 (A) NEGATIVE mg/dL   Nitrite NEGATIVE NEGATIVE   Leukocytes, UA NEGATIVE NEGATIVE   RBC / HPF 0-5 0 - 5 RBC/hpf   WBC, UA 0-5 0 - 5 WBC/hpf   Bacteria, UA RARE (A) NONE SEEN   Squamous Epithelial / LPF 0-5 (A) NONE SEEN   Mucous PRESENT    Hyaline Casts, UA PRESENT   Troponin I  Result Value Ref Range   Troponin I <0.03 <0.03 ng/mL  I-Stat CG4 Lactic Acid, ED  Result Value Ref Range   Lactic Acid, Venous 2.55 (HH) 0.5 - 1.9 mmol/L   Dg Chest 2 View Result Date: 03/09/2017 CLINICAL DATA:  Fevers EXAM: CHEST  2 VIEW COMPARISON:  02/25/2017 FINDINGS: Cardiac  shadow  is within normal limits. The lungs are well aerated bilaterally. Some mild left basilar atelectasis is seen with elevation of the left hemidiaphragm. No sizable effusion is seen. Scattered sclerotic areas are noted within the bony structures consistent with the given clinical history of prostate carcinoma with bony metastatic disease. IMPRESSION: New left basilar atelectasis. Electronically Signed   By: Inez Catalina M.D.   On: 03/09/2017 15:43    Ct Head Wo Contrast Result Date: 03/09/2017 CLINICAL DATA:  Altered mental status and fever. History of prostate carcinoma EXAM: CT HEAD WITHOUT CONTRAST TECHNIQUE: Contiguous axial images were obtained from the base of the skull through the vertex without intravenous contrast. COMPARISON:  None. FINDINGS: Brain: There is age related volume loss. There is no intracranial mass, hemorrhage, extra-axial fluid collection, or midline shift. There is a focal area of decreased attenuation in the anterior limb of the right internal capsule, concerning for a small acute infarct in this area. Elsewhere there is rather minimal small vessel disease in the centra semiovale bilaterally. Vascular: No hyperdense vessel. There is calcification in each carotid siphon region. Skull: Bony calvarium appears intact. Sinuses/Orbits: There is mucosal thickening in the sphenoid sinus regions without air-fluid level. There is slight mucosal thickening in several ethmoid air cells. Other visualized paranasal sinuses are clear. Orbits appear symmetric bilaterally. Other: Visualized mastoid air cells are clear. 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 No other evidence of acute appearing infarct. There is age related volume loss with slight periventricular small vessel disease. No hemorrhage or mass effect. Areas of arterial vascular calcification noted. Areas of paranasal sinus disease evident. Electronically Signed   By: Lowella Grip  III M.D.   On: 03/09/2017 16:33    Mr Brain Wo Contrast (neuro Protocol) Result Date: 03/09/2017 CLINICAL DATA:  Febrile, altered mental status. Possible new infarct on recent head CT. History of metastatic prostate cancer. EXAM: MRI HEAD WITHOUT CONTRAST MRA HEAD WITHOUT CONTRAST TECHNIQUE: Sagittal T1, axial coronal diffusion weighted imaging of the brain and surrounding structures were obtained without intravenous contrast. Angiographic images of the head were obtained using MRA technique without contrast. COMPARISON:  None. FINDINGS: MRI HEAD FINDINGS- motion degraded examination, incomplete. Technologist reports the patient attempted to climb at of scanner twice. BRAIN: No reduced diffusion to suggest acute ischemia, hypercellular tumor or typical infection. No hydrocephalus. No mass effect. No abnormal extra-axial fluid collections. VASCULAR: Not assessed. SKULL AND UPPER CERVICAL SPINE: No abnormal sellar expansion. Craniocervical junction maintained. Heterogeneous bone marrow signal global pole for metastatic disease given patient's history of prostate cancer SINUSES/ORBITS: Not assessed. OTHER: None. MRA HEAD FINDINGS- mildly motion degraded examination. ANTERIOR CIRCULATION: Normal flow related enhancement of the included cervical, petrous, cavernous and supraclinoid internal carotid arteries. Patent anterior communicating artery. Normal flow related enhancement of the anterior and middle cerebral arteries, including distal segments. No definite aneurysm though limited by patient motion. POSTERIOR CIRCULATION: Codominant vertebral artery's. Basilar artery is patent, with normal flow related enhancement of the main branch vessels. Normal flow related enhancement of the posterior cerebral arteries. Small bilateral posterior communicating arteries. No definite aneurysm though limited by patient motion. ANATOMIC VARIANTS: None. IMPRESSION: MRI HEAD: Limited 3 sequence MRI of the head without acute  intracranial process. MRA HEAD: Mildly motion degraded examination without emergent large vessel occlusion or severe stenosis. Electronically Signed   By: Elon Alas M.D.   On: 03/09/2017 18:04    Mr Jodene Nam Head (cerebral Arteries) Result Date: 03/09/2017 CLINICAL DATA:  Febrile, altered mental status. Possible new infarct on recent head CT. History of metastatic prostate cancer. EXAM: MRI HEAD WITHOUT CONTRAST MRA HEAD WITHOUT CONTRAST TECHNIQUE: Sagittal T1, axial coronal diffusion weighted imaging of the brain and surrounding structures were obtained without intravenous contrast. Angiographic images of the head were obtained using MRA technique without contrast. COMPARISON:  None. FINDINGS: MRI HEAD FINDINGS- motion degraded examination, incomplete. Technologist reports the patient attempted to climb at of scanner twice. BRAIN: No reduced diffusion to suggest acute ischemia, hypercellular tumor or typical infection. No hydrocephalus. No mass effect. No abnormal extra-axial fluid collections. VASCULAR: Not assessed. SKULL AND UPPER CERVICAL SPINE: No abnormal sellar expansion. Craniocervical junction maintained. Heterogeneous bone marrow signal global pole for metastatic disease given patient's history of prostate cancer SINUSES/ORBITS: Not assessed. OTHER: None. MRA HEAD FINDINGS- mildly motion degraded examination. ANTERIOR CIRCULATION: Normal flow related enhancement of the included cervical, petrous, cavernous and supraclinoid internal carotid arteries. Patent anterior communicating artery. Normal flow related enhancement of the anterior and middle cerebral arteries, including distal segments. No definite aneurysm though limited by patient motion. POSTERIOR CIRCULATION: Codominant vertebral artery's. Basilar artery is patent, with normal flow related enhancement of the main branch vessels. Normal flow related enhancement of the posterior cerebral arteries. Small bilateral posterior communicating  arteries. No definite aneurysm though limited by patient motion. ANATOMIC VARIANTS: None. IMPRESSION: MRI HEAD: Limited 3 sequence MRI of the head without acute intracranial process. MRA HEAD: Mildly motion degraded examination without emergent large vessel occlusion or severe stenosis. Electronically Signed   By: Elon Alas M.D.   On: 03/09/2017 18:04     1700:   Pt unable to take APAP and ibuprofen per family. Cooling blanket applied to tx fever. IVF bolus and IV abx started per sepsis protocol. BC and UC pending. Will obtain MRI brain to f/u CT-H finding of possible new acute infarct. Dx and testing d/w pt and family.  Questions answered.  Verb understanding, agreeable to admit. T/C to [facility] Dr. Lorin Mercy, case discussed, including:  HPI, pertinent PM/SHx, VS/PE, dx testing, ED course and treatment:  Agreeable to admit.    Final Clinical Impressions(s) / ED Diagnoses   Final diagnoses:  None    New Prescriptions New Prescriptions   No medications on file      Francine Graven, DO 03/13/17 1524

## 2017-03-09 NOTE — ED Notes (Signed)
EKG given to Dr. McManus.  

## 2017-03-10 LAB — CBC
HCT: 25.6 % — ABNORMAL LOW (ref 39.0–52.0)
Hemoglobin: 7.8 g/dL — ABNORMAL LOW (ref 13.0–17.0)
MCH: 28.5 pg (ref 26.0–34.0)
MCHC: 30.5 g/dL (ref 30.0–36.0)
MCV: 93.4 fL (ref 78.0–100.0)
PLATELETS: 77 10*3/uL — AB (ref 150–400)
RBC: 2.74 MIL/uL — ABNORMAL LOW (ref 4.22–5.81)
RDW: 18.8 % — AB (ref 11.5–15.5)
WBC: 5.8 10*3/uL (ref 4.0–10.5)

## 2017-03-10 LAB — BASIC METABOLIC PANEL
Anion gap: 8 (ref 5–15)
BUN: 18 mg/dL (ref 6–20)
CALCIUM: 8.7 mg/dL — AB (ref 8.9–10.3)
CO2: 25 mmol/L (ref 22–32)
CREATININE: 0.76 mg/dL (ref 0.61–1.24)
Chloride: 104 mmol/L (ref 101–111)
GFR calc Af Amer: >60 mL/min (ref >60)
GFR calc non Af Amer: >60 mL/min (ref >60)
GLUCOSE: 107 mg/dL — AB (ref 65–99)
Potassium: 3.7 mmol/L (ref 3.5–5.1)
Sodium: 137 mmol/L (ref 135–145)

## 2017-03-10 MED ORDER — VANCOMYCIN HCL IN DEXTROSE 750-5 MG/150ML-% IV SOLN
INTRAVENOUS | Status: AC
  Filled 2017-03-10: qty 150

## 2017-03-10 MED ORDER — ENSURE ENLIVE PO LIQD
237.0000 mL | Freq: Three times a day (TID) | ORAL | Status: DC
  Administered 2017-03-10 – 2017-03-12 (×6): 237 mL via ORAL

## 2017-03-10 NOTE — Progress Notes (Signed)
PROGRESS NOTE    Ryan Ware  IZT:[id number] DOB: 02/25/1951 DOA: 03/09/2017 PCP: [facility]     Brief Narrative:  66 y/o man admitted from home on 4.23 due to fever without any other specific complaints. Admission requested.   Assessment & Plan:   Principal Problem:  fever Active Problems:   Prostate cancer metastatic to bone (HCC)   Protein-calorie malnutrition, severe   Thrombocytopenia (HCC)   Elevated LFTs   Altered mental status, unspecified   Fever -No Sepsis as we do not have a source. -He has defervesced. -UA/CXR WNL. -Cx data pending. -Will DC vanc/zosyn (?what are we treating) and observe overnight. If no further temp can DC home. If spikes again, will need FUO work up including CT chest/abd/pelvis.  Metastatic Prostate Cancer -Will need follow up with oncology as an OP.  Acute Encephalopathy -Resolved. -MRI without evidence for CVA.   DVT prophylaxis: SCDs Code Status: DNR Family Communication: patient only Disposition Plan: hope for DC home in 24 hours  Consultants:   None  Procedures:   None  Antimicrobials:  Anti-infectives    Start     Dose/Rate Route Frequency Ordered Stop   03/10/17 0200  vancomycin (VANCOCIN) IVPB 750 mg/150 ml premix  Status:  Discontinued     750 mg 150 mL/hr over 60 Minutes Intravenous Every 8 hours 03/09/17 1626 03/10/17 1337   03/10/17 0100  piperacillin-tazobactam (ZOSYN) IVPB 3.375 g  Status:  Discontinued     3.375 g 12.5 mL/hr over 240 Minutes Intravenous Every 8 hours 03/09/17 1624 03/10/17 1337   03/09/17 2000  piperacillin-tazobactam (ZOSYN) IVPB 3.375 g  Status:  Discontinued     3.375 g 100 mL/hr over 30 Minutes Intravenous  Once 03/09/17 1954 03/09/17 1955   03/09/17 2000  vancomycin (VANCOCIN) IVPB 1000 mg/200 mL premix  Status:  Discontinued     1,000 mg 200 mL/hr over 60 Minutes Intravenous  Once 03/09/17 1954 03/09/17 1957   03/09/17 1630  piperacillin-tazobactam (ZOSYN) IVPB  3.375 g     3.375 g 100 mL/hr over 30 Minutes Intravenous  Once 03/09/17 1621 03/09/17 1818   03/09/17 1630  vancomycin (VANCOCIN) IVPB 1000 mg/200 mL premix  Status:  Discontinued     1,000 mg 200 mL/hr over 60 Minutes Intravenous  Once 03/09/17 1621 03/09/17 1623   03/09/17 1630  vancomycin (VANCOCIN) 1,250 mg in sodium chloride 0.9 % 250 mL IVPB     1,250 mg 166.7 mL/hr over 90 Minutes Intravenous  Once 03/09/17 1623 03/09/17 1823       Subjective: Feels well, no complaints other than a little fatigued  Objective: Vitals:   03/09/17 1830 03/09/17 1934 03/10/17 0023 03/10/17 0722  BP: 138/85 (!) 146/78 (!) 122/56 (!) 141/71  Pulse:  (!) 109 (!) 105 (!) 108  Resp: (!) 26 20 20 20   Temp:  97.6 F (36.4 C) 98.4 F (36.9 C) 98.4 F (36.9 C)  TempSrc:  Oral Oral Oral  SpO2:  97% 97% 99%  Weight:  65.1 kg (143 lb 8 oz)    Height:        Intake/Output Summary (Last 24 hours) at 03/10/17 1620 Last data filed at 03/10/17 1200  Gross per 24 hour  Intake             2310 ml  Output              200 ml  Net  2110 ml   Filed Weights   03/09/17 1441 03/09/17 1934  Weight: 67.6 kg (149 lb) 65.1 kg (143 lb 8 oz)    Examination:  General exam: Alert, awake, oriented x 3 Respiratory system: Clear to auscultation. Respiratory effort normal. Cardiovascular system:RRR. No murmurs, rubs, gallops. Gastrointestinal system: Abdomen is nondistended, soft and nontender. No organomegaly or masses felt. Normal bowel sounds heard. Central nervous system: Alert and oriented. No focal neurological deficits. Extremities: No C/C/E, +pedal pulses Skin: No rashes, lesions or ulcers Psychiatry: Judgement and insight appear normal. Mood & affect appropriate.     Data Reviewed: I have personally reviewed following labs and imaging studies  CBC:  Recent Labs Lab 03/09/17 1522 03/10/17 0422  WBC 8.6 5.8  NEUTROABS 6.0  --   HGB 9.3* 7.8*  HCT 29.3* 25.6*  MCV 91.0 93.4  PLT  96* 77*   Basic Metabolic Panel:  Recent Labs Lab 03/09/17 1522 03/10/17 0422  NA 134* 137  K 3.7 3.7  CL 99* 104  CO2 24 25  GLUCOSE 102* 107*  BUN 20 18  CREATININE 0.84 0.76  CALCIUM 9.4 8.7*   GFR: Estimated Creatinine Clearance: 85.9 mL/min (by C-G formula based on SCr of 0.76 mg/dL). Liver Function Tests:  Recent Labs Lab 03/09/17 1522  AST 117*  ALT 43  ALKPHOS 271*  BILITOT 2.2*  PROT 7.2  ALBUMIN 2.9*   No results for input(s): LIPASE, AMYLASE in the last 168 hours. No results for input(s): AMMONIA in the last 168 hours. Coagulation Profile:  Recent Labs Lab 03/09/17 2007  INR 1.75   Cardiac Enzymes:  Recent Labs Lab 03/09/17 1522  TROPONINI <0.03   BNP (last 3 results) No results for input(s): PROBNP in the last 8760 hours. HbA1C: No results for input(s): HGBA1C in the last 72 hours. CBG: No results for input(s): GLUCAP in the last 168 hours. Lipid Profile: No results for input(s): CHOL, HDL, LDLCALC, TRIG, CHOLHDL, LDLDIRECT in the last 72 hours. Thyroid Function Tests: No results for input(s): TSH, T4TOTAL, FREET4, T3FREE, THYROIDAB in the last 72 hours. Anemia Panel: No results for input(s): VITAMINB12, FOLATE, FERRITIN, TIBC, IRON, RETICCTPCT in the last 72 hours. Urine analysis:    Component Value Date/Time   COLORURINE AMBER (A) 03/09/2017 1534   APPEARANCEUR HAZY (A) 03/09/2017 1534   LABSPEC 1.021 03/09/2017 1534   PHURINE 5.0 03/09/2017 1534   GLUCOSEU NEGATIVE 03/09/2017 1534   HGBUR NEGATIVE 03/09/2017 1534   BILIRUBINUR NEGATIVE 03/09/2017 1534   KETONESUR NEGATIVE 03/09/2017 1534   PROTEINUR 30 (A) 03/09/2017 1534   NITRITE NEGATIVE 03/09/2017 1534   LEUKOCYTESUR NEGATIVE 03/09/2017 1534   Sepsis Labs: @LABRCNTIP (procalcitonin:4,lacticidven:4)  ) Recent Results (from the past 240 hour(s))  Culture, blood (Routine x 2)     Status: None (Preliminary result)   Collection Time: 03/09/17  3:36 PM  Result Value Ref  Range Status   Specimen Description RIGHT ANTECUBITAL  Final   Special Requests   Final    BOTTLES DRAWN AEROBIC AND ANAEROBIC Blood Culture adequate volume   Culture NO GROWTH < 24 HOURS  Final   Report Status PENDING  Incomplete  Culture, blood (Routine x 2)     Status: None (Preliminary result)   Collection Time: 03/09/17  3:43 PM  Result Value Ref Range Status   Specimen Description LEFT ANTECUBITAL  Final   Special Requests   Final    BOTTLES DRAWN AEROBIC AND ANAEROBIC Blood Culture adequate volume   Culture NO GROWTH <  24 HOURS  Final   Report Status PENDING  Incomplete         Radiology Studies: Dg Chest 2 View  Result Date: 03/09/2017 CLINICAL DATA:  Fevers EXAM: CHEST  2 VIEW COMPARISON:  02/25/2017 FINDINGS: Cardiac shadow is within normal limits. The lungs are well aerated bilaterally. Some mild left basilar atelectasis is seen with elevation of the left hemidiaphragm. No sizable effusion is seen. Scattered sclerotic areas are noted within the bony structures consistent with the given clinical history of prostate carcinoma with bony metastatic disease. IMPRESSION: New left basilar atelectasis. Electronically Signed   By: Inez Catalina M.D.   On: 03/09/2017 15:43   Ct Head Wo Contrast  Result Date: 03/09/2017 CLINICAL DATA:  Altered mental status and fever. History of prostate carcinoma EXAM: CT HEAD WITHOUT CONTRAST TECHNIQUE: Contiguous axial images were obtained from the base of the skull through the vertex without intravenous contrast. COMPARISON:  None. FINDINGS: Brain: There is age related volume loss. There is no intracranial mass, hemorrhage, extra-axial fluid collection, or midline shift. There is a focal area of decreased attenuation in the anterior limb of the right internal capsule, concerning for a small acute infarct in this area. Elsewhere there is rather minimal small vessel disease in the centra semiovale bilaterally. Vascular: No hyperdense vessel. There is  calcification in each carotid siphon region. Skull: Bony calvarium appears intact. Sinuses/Orbits: There is mucosal thickening in the sphenoid sinus regions without air-fluid level. There is slight mucosal thickening in several ethmoid air cells. Other visualized paranasal sinuses are clear. Orbits appear symmetric bilaterally. Other: Visualized mastoid air cells are clear. 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 No other evidence of acute appearing infarct. There is age related volume loss with slight periventricular small vessel disease. No hemorrhage or mass effect. Areas of arterial vascular calcification noted. Areas of paranasal sinus disease evident. Electronically Signed   By: Lowella Grip III M.D.   On: 03/09/2017 16:33   Mr Brain Wo Contrast (neuro Protocol)  Result Date: 03/09/2017 CLINICAL DATA:  Febrile, altered mental status. Possible new infarct on recent head CT. History of metastatic prostate cancer. EXAM: MRI HEAD WITHOUT CONTRAST MRA HEAD WITHOUT CONTRAST TECHNIQUE: Sagittal T1, axial coronal diffusion weighted imaging of the brain and surrounding structures were obtained without intravenous contrast. Angiographic images of the head were obtained using MRA technique without contrast. COMPARISON:  None. FINDINGS: MRI HEAD FINDINGS- motion degraded examination, incomplete. Technologist reports the patient attempted to climb at of scanner twice. BRAIN: No reduced diffusion to suggest acute ischemia, hypercellular tumor or typical infection. No hydrocephalus. No mass effect. No abnormal extra-axial fluid collections. VASCULAR: Not assessed. SKULL AND UPPER CERVICAL SPINE: No abnormal sellar expansion. Craniocervical junction maintained. Heterogeneous bone marrow signal global pole for metastatic disease given patient's history of prostate cancer SINUSES/ORBITS: Not assessed. OTHER: None. MRA HEAD FINDINGS- mildly motion degraded examination.  ANTERIOR CIRCULATION: Normal flow related enhancement of the included cervical, petrous, cavernous and supraclinoid internal carotid arteries. Patent anterior communicating artery. Normal flow related enhancement of the anterior and middle cerebral arteries, including distal segments. No definite aneurysm though limited by patient motion. POSTERIOR CIRCULATION: Codominant vertebral artery's. Basilar artery is patent, with normal flow related enhancement of the main branch vessels. Normal flow related enhancement of the posterior cerebral arteries. Small bilateral posterior communicating arteries. No definite aneurysm though limited by patient motion. ANATOMIC VARIANTS: None. IMPRESSION: MRI HEAD: Limited 3 sequence MRI of the head without  acute intracranial process. MRA HEAD: Mildly motion degraded examination without emergent large vessel occlusion or severe stenosis. Electronically Signed   By: Elon Alas M.D.   On: 03/09/2017 18:04   Mr Jodene Nam Head (cerebral Arteries)  Result Date: 03/09/2017 CLINICAL DATA:  Febrile, altered mental status. Possible new infarct on recent head CT. History of metastatic prostate cancer. EXAM: MRI HEAD WITHOUT CONTRAST MRA HEAD WITHOUT CONTRAST TECHNIQUE: Sagittal T1, axial coronal diffusion weighted imaging of the brain and surrounding structures were obtained without intravenous contrast. Angiographic images of the head were obtained using MRA technique without contrast. COMPARISON:  None. FINDINGS: MRI HEAD FINDINGS- motion degraded examination, incomplete. Technologist reports the patient attempted to climb at of scanner twice. BRAIN: No reduced diffusion to suggest acute ischemia, hypercellular tumor or typical infection. No hydrocephalus. No mass effect. No abnormal extra-axial fluid collections. VASCULAR: Not assessed. SKULL AND UPPER CERVICAL SPINE: No abnormal sellar expansion. Craniocervical junction maintained. Heterogeneous bone marrow signal global pole for  metastatic disease given patient's history of prostate cancer SINUSES/ORBITS: Not assessed. OTHER: None. MRA HEAD FINDINGS- mildly motion degraded examination. ANTERIOR CIRCULATION: Normal flow related enhancement of the included cervical, petrous, cavernous and supraclinoid internal carotid arteries. Patent anterior communicating artery. Normal flow related enhancement of the anterior and middle cerebral arteries, including distal segments. No definite aneurysm though limited by patient motion. POSTERIOR CIRCULATION: Codominant vertebral artery's. Basilar artery is patent, with normal flow related enhancement of the main branch vessels. Normal flow related enhancement of the posterior cerebral arteries. Small bilateral posterior communicating arteries. No definite aneurysm though limited by patient motion. ANATOMIC VARIANTS: None. IMPRESSION: MRI HEAD: Limited 3 sequence MRI of the head without acute intracranial process. MRA HEAD: Mildly motion degraded examination without emergent large vessel occlusion or severe stenosis. Electronically Signed   By: Elon Alas M.D.   On: 03/09/2017 18:04        Scheduled Meds: . docusate sodium  100 mg Oral BID  . feeding supplement (ENSURE ENLIVE)  237 mL Oral TID BM  . folic acid  1 mg Oral Daily  . multivitamin with minerals  1 tablet Oral Daily  . pantoprazole  40 mg Oral Daily   Continuous Infusions: . lactated ringers 100 mL/hr at 03/10/17 1250     LOS: 1 day    Time spent: 25 minutes. Greater than 50% of this time was spent in direct contact with the patient coordinating care.     Lelon Frohlich, MD [facility] Pager [phone]  If 7PM-7AM, please contact night-coverage [email or web address] Password [email or web address] 03/10/2017, 4:20 PM

## 2017-03-10 NOTE — Progress Notes (Signed)
Initial Nutrition Assessment  DOCUMENTATION CODES:  Severe malnutrition in context of chronic illness, Underweight  INTERVENTION:  Ensure Enlive po TID, each supplement provides 350 kcal and 20 grams of protein  Food requests  Nutritional status worsening-Pt shows continued weight loss and reports progressive loss of appetite.  NUTRITION DIAGNOSIS:  Severe Malnutrition related to cancer and cancer related treatments as evidenced by severe fat/muscle depletion and loss of 10% bw in <39months  GOAL:  Patient will meet greater than or equal to 90% of their needs  MONITOR:  PO intake, Supplement acceptance, Weight trends, Labs  REASON FOR ASSESSMENT:  Consult "Malnutrition"  ASSESSMENT:  66 y/o male PMHx Prostate Cancer w/ Bone metastases, anemia, thrombocytopenia, leukopenia, Hep C. Recurrent hospitalizations for anemia. Hospitalized 2 weeks (4/11-4/13) ago for sepsis. Presents with fever and family report of the patient experiencing confusion w/ strong urine smell. Worked up for suspected preseptic condition. New acute infarct.   Pt has been seen by this RD 2 times prior in the last month.  He is relatively unchanged today. He does endorse a progressive decrease in his appetite and subsequently has lost the weight he was found to have gained last admission (4/11-4/13).   Denies any new symptoms. Still prefers softer foods. Still drinking Ensure. No N/V/C/D.    He was agreeable to restarting Ensure supplementation. Has tried magic cup in the past, but was not too fond of these. RD took food requests, helped him set up his lunch tray.   Physical Exam: Severe Fat/muscle depletion is unchanged. Cachectic.   Labs: H/H:7.8/25.6, Albumin: 2.9,  Medications: Ensure, Folate, colace, mvi, ppi, IV abx, IVF   Recent Labs Lab 03/09/17 1522 03/10/17 0422  NA 134* 137  K 3.7 3.7  CL 99* 104  CO2 24 25  BUN 20 18  CREATININE 0.84 0.76  CALCIUM 9.4 8.7*  GLUCOSE 102* 107*   Diet  Order:  Diet regular Room service appropriate? Yes; Fluid consistency: Thin  Skin:  Reviewed, no issues  Last BM:  4/23  Height:  Ht Readings from Last 1 Encounters:  03/09/17 6' 5.5" (1.969 m)   Weight:  Wt Readings from Last 1 Encounters:  03/09/17 143 lb 8 oz (65.1 kg)   Wt Readings from Last 10 Encounters:  03/09/17 143 lb 8 oz (65.1 kg)  02/25/17 148 lb 13 oz (67.5 kg)  01/28/17 144 lb 12.8 oz (65.7 kg)  01/15/17 161 lb 9.6 oz (73.3 kg)  01/09/17 159 lb 3.2 oz (72.2 kg)  01/05/17 159 lb (72.1 kg)  11/14/16 200 lb (90.7 kg)  02/21/16 210 lb (95.3 kg)  01/28/16 211 lb 1 oz (95.7 kg)  11/14/14 230 lb (104.3 kg)   Ideal Body Weight:  95.91 kg  BMI:  Body mass index is 16.8 kg/m.  Estimated Nutritional Needs:  Kcal:  2300-2500 (35-38 kcal/kg bw) Protein:  98-111 (1.5-1.7 L fluid) Fluid:  >2 Liters (30 ml/kg)  EDUCATION NEEDS:  No education needs identified at this time  Burtis Junes RD, LDN, [facility] Pager: [phone] 03/10/2017 1:20 PM

## 2017-03-10 NOTE — Plan of Care (Signed)
Problem: Physical Regulation: Goal: Will remain free from infection Outcome: Not Progressing Unknown source of infection  Problem: Tissue Perfusion: Goal: Risk factors for ineffective tissue perfusion will decrease Outcome: Not Progressing Pt refuses SCDs.  Problem: Nutrition: Goal: Adequate nutrition will be maintained Outcome: Not Progressing Pt only ate about 25% of meal.

## 2017-03-10 NOTE — Care Management Note (Signed)
Case Management Note  Patient Details  Name: Ryan Ware MRN: [id number] Date of Birth: 1951/02/19  Subjective/Objective:                  Pt admitted with UTI. He is from home, lives alone, has brother for support. He uses cane and walk for ambulation. He has aid that comes 3 days a week through [place]. Pt goes to [place] for oncology care. [facility] sent him to ED and they are aware of admission.   Action/Plan: Anticipate return home with resumption of previous living/care arrangements. CM will cont to follow.   Expected Discharge Date:     03/12/2017             Expected Discharge Plan:  Home/Self Care  In-House Referral:  NA  Discharge planning Services  CM Consult  Post Acute Care Choice:  NA Choice offered to:  NA  Status of Service:  In process, will continue to follow  Sherald Barge, RN 03/10/2017, 3:37 PM

## 2017-03-11 ENCOUNTER — Encounter (HOSPITAL_COMMUNITY): Payer: Self-pay | Admitting: Primary Care

## 2017-03-11 DIAGNOSIS — R4182 Altered mental status, unspecified: Secondary | ICD-10-CM

## 2017-03-11 DIAGNOSIS — C61 Malignant neoplasm of prostate: Principal | ICD-10-CM

## 2017-03-11 DIAGNOSIS — Z515 Encounter for palliative care: Secondary | ICD-10-CM

## 2017-03-11 DIAGNOSIS — E43 Unspecified severe protein-calorie malnutrition: Secondary | ICD-10-CM

## 2017-03-11 DIAGNOSIS — C7951 Secondary malignant neoplasm of bone: Secondary | ICD-10-CM

## 2017-03-11 DIAGNOSIS — D696 Thrombocytopenia, unspecified: Secondary | ICD-10-CM

## 2017-03-11 DIAGNOSIS — R509 Fever, unspecified: Secondary | ICD-10-CM

## 2017-03-11 DIAGNOSIS — Z7189 Other specified counseling: Secondary | ICD-10-CM

## 2017-03-11 LAB — URINE CULTURE

## 2017-03-11 LAB — PREPARE RBC (CROSSMATCH)

## 2017-03-11 MED ORDER — SODIUM CHLORIDE 0.9 % IV SOLN: Freq: Once | INTRAVENOUS | Status: DC

## 2017-03-11 MED ORDER — DEXAMETHASONE 4 MG PO TABS
4.0000 mg | ORAL_TABLET | Freq: Two times a day (BID) | ORAL | Status: DC
  Administered 2017-03-11 – 2017-03-12 (×3): 4 mg via ORAL
  Filled 2017-03-11 (×4): qty 1

## 2017-03-11 MED ORDER — OXYCODONE HCL 5 MG PO TABS
15.0000 mg | ORAL_TABLET | Freq: Four times a day (QID) | ORAL | Status: DC
  Administered 2017-03-11 – 2017-03-12 (×5): 15 mg via ORAL
  Filled 2017-03-11 (×5): qty 3

## 2017-03-11 MED ORDER — OXYCODONE HCL 5 MG PO TABS
15.0000 mg | ORAL_TABLET | Freq: Once | ORAL | Status: AC
  Administered 2017-03-11: 15 mg via ORAL
  Filled 2017-03-11: qty 3

## 2017-03-11 MED ORDER — OXYCODONE HCL 5 MG PO TABS
5.0000 mg | ORAL_TABLET | Freq: Four times a day (QID) | ORAL | Status: DC | PRN
  Administered 2017-03-11 – 2017-03-12 (×2): 5 mg via ORAL
  Filled 2017-03-11 (×2): qty 1

## 2017-03-11 NOTE — Progress Notes (Addendum)
PT Cancellation Note  Patient Details Name: JARET COPPEDGE MRN: [id number] DOB: 1951-02-25   Cancelled Treatment:    Reason Eval/Treat Not Completed: Pain limiting ability to participate (Pt declined PT at this time due to pain.  Contacted RN regarding pain medication.  Will attempt back as schedule allows. )   Attempted again at 1515, however pt requests to be seen in the AM.  Will check back on pt tomorrow AM.   Eustaquio Maize Riannah Stagner, PT, DPT X: [phone]

## 2017-03-11 NOTE — Progress Notes (Signed)
PROGRESS NOTE                                                                                                                                                                                                             Patient Demographics:    Ryan Ware, is a 66 y.o. male, DOB - [birth year], FXT:[id number]  Admit date - 03/09/2017   Admitting Physician Karmen Bongo, MD  Outpatient Primary MD for the patient is [facility]  LOS - 2  Outpatient Specialists: Tammi Klippel - rad onc; McCoy- oncology ([place]); [facility] - urology  Chief Complaint  Patient presents with  . Fever       Brief Narrative  66 y.o. 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   Subjective:    Ryan Ware today has, No headache, No chest pain, No abdominal pain , Or generalized weakness, but no focal deficits, denied any urinary retention or stool incontinence.    Assessment  & Plan :    Principal Problem:   Sepsis ([place]) Active Problems:   Prostate cancer metastatic to bone (HCC)   Protein-calorie malnutrition, severe   Thrombocytopenia (HCC)   Elevated LFTs   Altered mental status, unspecified  Fever - so far  no evidence of infection, urinalysis and chest x-ray with no evidence of infection, blood culture with no growth to date, he has defervesced, continue to monitor off antibiotics, MAXIMUM TEMPERATURE was 100.2 at PCP office  Metastatic prostate cancer - To continue  follow-up with his oncology team at [facility] will plan discharge  Failure to thrive/severe protein calorie malnutrition - PT has been consulted, patient with progressive weakness, but nothing focal, continue supplement - Anemia contributing  to his weakness, will transfuse 1 unit PRBC  Anemia of chronic illness - Hemoglobin 7.8 today, required multiple blood transfusions in the past, will transfuse 1 unit PRBC to see if it helps with his generalized weakness  Acute Encephalopathy -Resolved. -MRI without evidence for CVA.  Chronic pain syndrome - Suspect secondary to malignancy with bone metastasis, continue with home pain regimen, palliative medicine consulted, will start on Decadron if it helps with his  pain.  Thrombocytopenia - chronic at baseline, is likely due to bone metastasis   Code Status : DNR  Family Communication  : None at bedside  Disposition Plan  : Pending PT  Consults  :  None  Procedures  : none  DVT Prophylaxis  :  SCDs   Lab Results  Component Value Date   PLT 77 (L) 03/10/2017    Antibiotics  :    Anti-infectives    Start     Dose/Rate Route Frequency Ordered Stop   03/10/17 0200  vancomycin (VANCOCIN) IVPB 750 mg/150 ml premix  Status:  Discontinued     750 mg 150 mL/hr over 60 Minutes Intravenous Every 8 hours 03/09/17 1626 03/10/17 1337   03/10/17 0100  piperacillin-tazobactam (ZOSYN) IVPB 3.375 g  Status:  Discontinued     3.375 g 12.5 mL/hr over 240 Minutes Intravenous Every 8 hours 03/09/17 1624 03/10/17 1337   03/09/17 2000  piperacillin-tazobactam (ZOSYN) IVPB 3.375 g  Status:  Discontinued     3.375 g 100 mL/hr over 30 Minutes Intravenous  Once 03/09/17 1954 03/09/17 1955   03/09/17 2000  vancomycin (VANCOCIN) IVPB 1000 mg/200 mL premix  Status:  Discontinued     1,000 mg 200 mL/hr over 60 Minutes Intravenous  Once 03/09/17 1954 03/09/17 1957   03/09/17 1630  piperacillin-tazobactam (ZOSYN) IVPB 3.375 g     3.375 g 100 mL/hr over 30 Minutes Intravenous  Once 03/09/17 1621 03/09/17 1818   03/09/17 1630  vancomycin (VANCOCIN) IVPB 1000 mg/200 mL premix  Status:  Discontinued     1,000 mg 200 mL/hr over 60 Minutes Intravenous  Once 03/09/17 1621 03/09/17 1623   03/09/17  1630  vancomycin (VANCOCIN) 1,250 mg in sodium chloride 0.9 % 250 mL IVPB     1,250 mg 166.7 mL/hr over 90 Minutes Intravenous  Once 03/09/17 1623 03/09/17 1823        Objective:   Vitals:   03/10/17 0722 03/10/17 2101 03/11/17 0610 03/11/17 1143  BP: (!) 141/71 (!) 113/59 129/79 125/69  Pulse: (!) 108 (!) 119 (!) 116 (!) 110  Resp: 20 20 18 18   Temp: 98.4 F (36.9 C) 100 F (37.8 C) 99 F (37.2 C) 99.2 F (37.3 C)  TempSrc: Oral Oral Oral Oral  SpO2: 99% 96% 95% 94%  Weight:      Height:        Wt Readings from Last 3 Encounters:  03/09/17 65.1 kg (143 lb 8 oz)  02/25/17 67.5 kg (148 lb 13 oz)  01/28/17 65.7 kg (144 lb 12.8 oz)     Intake/Output Summary (Last 24 hours) at 03/11/17 1226 Last data filed at 03/11/17 8119  Gross per 24 hour  Intake          3343.33 ml  Output              550 ml  Net          2793.33 ml     Physical Exam  Awake Alert, Oriented X 3, Cachectic, chronically ill-appearing Supple Neck,No JVD Symmetrical Chest wall movement, Good air movement bilaterally, CTAB Tachycardic,No Gallops,Rubs or new Murmurs, No Parasternal Heave +ve B.Sounds, Abd Soft, No tenderness, , No rebound - guarding or rigidity. No Cyanosis, Clubbing or edema, No new Rash or bruise      Data Review:    CBC  Recent Labs Lab 03/09/17 1522 03/10/17 0422  WBC 8.6 5.8  HGB 9.3* 7.8*  HCT 29.3* 25.6*  PLT 96* 77*  MCV 91.0 93.4  MCH 28.9 28.5  MCHC 31.7 30.5  RDW 19.3* 18.8*  LYMPHSABS 0.9  --   MONOABS 1.6*  --   EOSABS 0.0  --   BASOSABS 0.1  --     Chemistries   Recent Labs Lab 03/09/17 1522 03/10/17 0422  NA 134* 137  K 3.7 3.7  CL 99* 104  CO2 24 25  GLUCOSE 102* 107*  BUN 20 18  CREATININE 0.84 0.76  CALCIUM 9.4 8.7*  AST 117*  --   ALT 43  --   ALKPHOS 271*  --   BILITOT 2.2*  --    ------------------------------------------------------------------------------------------------------------------ No results for input(s): CHOL,  HDL, LDLCALC, TRIG, CHOLHDL, LDLDIRECT in the last 72 hours.  No results found for: HGBA1C ------------------------------------------------------------------------------------------------------------------ No results for input(s): TSH, T4TOTAL, T3FREE, THYROIDAB in the last 72 hours.  Invalid input(s): FREET3 ------------------------------------------------------------------------------------------------------------------ No results for input(s): VITAMINB12, FOLATE, FERRITIN, TIBC, IRON, RETICCTPCT in the last 72 hours.  Coagulation profile  Recent Labs Lab 03/09/17 2007  INR 1.75    No results for input(s): DDIMER in the last 72 hours.  Cardiac Enzymes  Recent Labs Lab 03/09/17 1522  TROPONINI <0.03   ------------------------------------------------------------------------------------------------------------------ No results found for: BNP  Inpatient Medications  Scheduled Meds: . dexamethasone  4 mg Oral Q12H  . docusate sodium  100 mg Oral BID  . feeding supplement (ENSURE ENLIVE)  237 mL Oral TID BM  . folic acid  1 mg Oral Daily  . multivitamin with minerals  1 tablet Oral Daily  . oxyCODONE  15 mg Oral Q6H  . oxyCODONE  15 mg Oral Once  . pantoprazole  40 mg Oral Daily   Continuous Infusions: . sodium chloride    . lactated ringers 100 mL/hr at 03/11/17 1052   PRN Meds:.acetaminophen **OR** acetaminophen, morphine injection, ondansetron **OR** ondansetron (ZOFRAN) IV, polyethylene glycol  Micro Results Recent Results (from the past 240 hour(s))  Urine culture     Status: Abnormal   Collection Time: 03/09/17  3:34 PM  Result Value Ref Range Status   Specimen Description URINE, CLEAN CATCH  Final   Special Requests NONE  Final   Culture (A)  Final    <10,000 COLONIES/mL INSIGNIFICANT GROWTH Performed at [facility], [address] [place], [place] [address]    Report Status 03/11/2017 FINAL  Final  Culture, blood (Routine x 2)     Status: None  (Preliminary result)   Collection Time: 03/09/17  3:36 PM  Result Value Ref Range Status   Specimen Description RIGHT ANTECUBITAL  Final   Special Requests   Final    BOTTLES DRAWN AEROBIC AND ANAEROBIC Blood Culture adequate volume   Culture NO GROWTH 2 DAYS  Final   Report Status PENDING  Incomplete  Culture, blood (Routine x 2)     Status: None (Preliminary result)   Collection Time: 03/09/17  3:43 PM  Result Value Ref Range Status   Specimen Description LEFT ANTECUBITAL  Final   Special Requests   Final    BOTTLES DRAWN AEROBIC AND ANAEROBIC Blood Culture adequate volume   Culture NO GROWTH 2 DAYS  Final   Report Status PENDING  Incomplete    Radiology Reports Dg Chest 2 View  Result Date: 03/09/2017 CLINICAL DATA:  Fevers EXAM: CHEST  2 VIEW COMPARISON:  02/25/2017 FINDINGS: Cardiac shadow is within normal limits. The lungs are well aerated bilaterally. Some mild left basilar atelectasis is seen with elevation of the left hemidiaphragm.  No sizable effusion is seen. Scattered sclerotic areas are noted within the bony structures consistent with the given clinical history of prostate carcinoma with bony metastatic disease. IMPRESSION: New left basilar atelectasis. Electronically Signed   By: Inez Catalina M.D.   On: 03/09/2017 15:43   Dg Chest 2 View  Result Date: 02/25/2017 CLINICAL DATA:  Back and chest pain for 3 days. EXAM: CHEST  2 VIEW COMPARISON:  Chest CT 11/14/2016 FINDINGS: The cardiac silhouette, mediastinal and hilar contours are within normal limits and stable. There is mild tortuosity and ectasia of the thoracic aorta. Low lung volumes with vascular crowding and streaky atelectasis but no definite infiltrates or effusions. Diffuse osteoblastic metastatic prostate cancer again demonstrated. No obvious pathologic fracture. IMPRESSION: No acute cardiopulmonary findings. Diffuse osseous metastatic disease without obvious pathologic compression fracture in the thoracic spine.  Electronically Signed   By: Marijo Sanes M.D.   On: 02/25/2017 15:48   Dg Lumbar Spine Complete  Result Date: 02/25/2017 CLINICAL DATA:  Back pain for 3 days. Prostate cancer with bone metastasis. Receiving radiation therapy. EXAM: LUMBAR SPINE - COMPLETE 4+ VIEW COMPARISON:  Radiograph 01/28/2017 FINDINGS: There is sclerosis of the vertebral bodies. No acute loss vertebral body height or disc height. Endplate spurring at W4-O9. No pars fracture. IMPRESSION: 1. No acute findings lumbar spine. 2. Disc osteophytic disease from L4-S1. 3. Diffuse bone sclerotic metastasis. Electronically Signed   By: Suzy Bouchard M.D.   On: 02/25/2017 15:47   Ct Head Wo Contrast  Result Date: 03/09/2017 CLINICAL DATA:  Altered mental status and fever. History of prostate carcinoma EXAM: CT HEAD WITHOUT CONTRAST TECHNIQUE: Contiguous axial images were obtained from the base of the skull through the vertex without intravenous contrast. COMPARISON:  None. FINDINGS: Brain: There is age related volume loss. There is no intracranial mass, hemorrhage, extra-axial fluid collection, or midline shift. There is a focal area of decreased attenuation in the anterior limb of the right internal capsule, concerning for a small acute infarct in this area. Elsewhere there is rather minimal small vessel disease in the centra semiovale bilaterally. Vascular: No hyperdense vessel. There is calcification in each carotid siphon region. Skull: Bony calvarium appears intact. Sinuses/Orbits: There is mucosal thickening in the sphenoid sinus regions without air-fluid level. There is slight mucosal thickening in several ethmoid air cells. Other visualized paranasal sinuses are clear. Orbits appear symmetric bilaterally. Other: Visualized mastoid air cells are clear. 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 No other evidence of acute appearing infarct. There is age related volume loss with slight  periventricular small vessel disease. No hemorrhage or mass effect. Areas of arterial vascular calcification noted. Areas of paranasal sinus disease evident. Electronically Signed   By: Lowella Grip III M.D.   On: 03/09/2017 16:33   Mr Brain Wo Contrast (neuro Protocol)  Result Date: 03/09/2017 CLINICAL DATA:  Febrile, altered mental status. Possible new infarct on recent head CT. History of metastatic prostate cancer. EXAM: MRI HEAD WITHOUT CONTRAST MRA HEAD WITHOUT CONTRAST TECHNIQUE: Sagittal T1, axial coronal diffusion weighted imaging of the brain and surrounding structures were obtained without intravenous contrast. Angiographic images of the head were obtained using MRA technique without contrast. COMPARISON:  None. FINDINGS: MRI HEAD FINDINGS- motion degraded examination, incomplete. Technologist reports the patient attempted to climb at of scanner twice. BRAIN: No reduced diffusion to suggest acute ischemia, hypercellular tumor or typical infection. No hydrocephalus. No mass effect. No abnormal extra-axial fluid collections. VASCULAR: Not  assessed. SKULL AND UPPER CERVICAL SPINE: No abnormal sellar expansion. Craniocervical junction maintained. Heterogeneous bone marrow signal global pole for metastatic disease given patient's history of prostate cancer SINUSES/ORBITS: Not assessed. OTHER: None. MRA HEAD FINDINGS- mildly motion degraded examination. ANTERIOR CIRCULATION: Normal flow related enhancement of the included cervical, petrous, cavernous and supraclinoid internal carotid arteries. Patent anterior communicating artery. Normal flow related enhancement of the anterior and middle cerebral arteries, including distal segments. No definite aneurysm though limited by patient motion. POSTERIOR CIRCULATION: Codominant vertebral artery's. Basilar artery is patent, with normal flow related enhancement of the main branch vessels. Normal flow related enhancement of the posterior cerebral arteries. Small  bilateral posterior communicating arteries. No definite aneurysm though limited by patient motion. ANATOMIC VARIANTS: None. IMPRESSION: MRI HEAD: Limited 3 sequence MRI of the head without acute intracranial process. MRA HEAD: Mildly motion degraded examination without emergent large vessel occlusion or severe stenosis. Electronically Signed   By: Elon Alas M.D.   On: 03/09/2017 18:04   Mr Lumbar Spine W Wo Contrast (assess For Abscess, Cord Compression)  Result Date: 02/25/2017 CLINICAL DATA:  Initial evaluation for acute back pain for 3 days. History of metastatic prostate cancer with osseous metastases. Currently undergoing radiation. EXAM: MRI LUMBAR SPINE WITHOUT AND WITH CONTRAST TECHNIQUE: Multiplanar and multiecho pulse sequences of the lumbar spine were obtained without and with intravenous contrast. CONTRAST:  6mL MULTIHANCE GADOBENATE DIMEGLUMINE 529 MG/ML IV SOLN COMPARISON:  Comparison made with previous MRI from 12/18/2016. FINDINGS: Segmentation: Normal segmentation. Lowest well-formed disc is labeled the L5-S1 level. Alignment: Normal alignment with preservation of the normal lumbar lordosis. No listhesis. Vertebrae: Diffuse osseous metastases seen throughout the visualized bone marrow. Overall, changes appears somewhat progressed relative to recent MRI. Vertebral body heights are maintained without associated vertebral body fracture. Extensive metastases with expansile tumor enhancement present within the visualize sacrum, greater on the right. Would be extremely difficult to exclude a superimposed pathologic fracture at this level. This is incompletely evaluated on this exam. No other associated pathologic fracture. No other significant extra osseous extension of tumor identified. Conus medullaris: Extends to the L2 level and appears normal. Paraspinal and other soft tissues: Paraspinous soft tissues demonstrate no acute abnormality. Visualized visceral structures within normal limits.  Left retroperitoneal/iliac lymph nodes measure up to 17 mm in short axis (series 9, image 28). Disc levels: Diffuse congenital shortening of the pedicles. L1-2: Normal disc. Bilateral facet degeneration. No significant canal or foraminal stenosis. L2-3: Diffuse degenerative disc bulge with disc desiccation and intervertebral disc space narrowing. Epidural lipomatosis. Short pedicles. Bilateral facet degeneration with ligamentum flavum hypertrophy. Stable severe canal stenosis. Thecal sac measures approximately 7 mm in AP diameter. No significant foraminal encroachment. L3-4: Mild disc bulge. Epidural lipomatosis. Short pedicles. Bilateral facet hypertrophy. Resultant severe canal stenosis with the thecal sac measuring 7 mm in AP diameter, similar to previous. No significant foraminal encroachment. L4-5: Left E centric disc bulge. Left greater than right facet arthrosis. Short pedicles with epidural lipomatosis. Resultant severe canal stenosis with the thecal sac measuring 7 mm in AP diameter. Changes relatively stable. No significant foraminal encroachment. L5-S1: Chronic diffuse degenerative disc osteophyte with intervertebral disc space narrowing. Broad posterior component indents the ventral thecal sac, encroaching upon the bilateral lateral recesses. Moderate to advanced bilateral facet arthrosis, worse on the right. Resultant severe subarticular stenosis bilaterally with moderate canal narrowing. Moderate right with mild left foraminal stenosis. Changes relatively stable. IMPRESSION: 1. Diffuse osseous metastatic disease, overall progressed/worsened in appearance relative to prior MRI  from 12/18/2016. No associated pathologic fracture within the lumbar spine. Would be extremely difficult to exclude a superimposed pathologic fracture at the level the sacrum. 2. Acquired on congenital spinal stenosis with severe canal stenosis extending from L2-3 through L4-5, with chronic degenerative disc osteophyte at L5-S1 as  above. Overall, changes are stable from previous. 3. Left retroperitoneal/iliac adenopathy as above, concerning for nodal metastatic disease. Electronically Signed   By: Jeannine Boga M.D.   On: 02/25/2017 19:12   Mr Jodene Nam Head (cerebral Arteries)  Result Date: 03/09/2017 CLINICAL DATA:  Febrile, altered mental status. Possible new infarct on recent head CT. History of metastatic prostate cancer. EXAM: MRI HEAD WITHOUT CONTRAST MRA HEAD WITHOUT CONTRAST TECHNIQUE: Sagittal T1, axial coronal diffusion weighted imaging of the brain and surrounding structures were obtained without intravenous contrast. Angiographic images of the head were obtained using MRA technique without contrast. COMPARISON:  None. FINDINGS: MRI HEAD FINDINGS- motion degraded examination, incomplete. Technologist reports the patient attempted to climb at of scanner twice. BRAIN: No reduced diffusion to suggest acute ischemia, hypercellular tumor or typical infection. No hydrocephalus. No mass effect. No abnormal extra-axial fluid collections. VASCULAR: Not assessed. SKULL AND UPPER CERVICAL SPINE: No abnormal sellar expansion. Craniocervical junction maintained. Heterogeneous bone marrow signal global pole for metastatic disease given patient's history of prostate cancer SINUSES/ORBITS: Not assessed. OTHER: None. MRA HEAD FINDINGS- mildly motion degraded examination. ANTERIOR CIRCULATION: Normal flow related enhancement of the included cervical, petrous, cavernous and supraclinoid internal carotid arteries. Patent anterior communicating artery. Normal flow related enhancement of the anterior and middle cerebral arteries, including distal segments. No definite aneurysm though limited by patient motion. POSTERIOR CIRCULATION: Codominant vertebral artery's. Basilar artery is patent, with normal flow related enhancement of the main branch vessels. Normal flow related enhancement of the posterior cerebral arteries. Small bilateral posterior  communicating arteries. No definite aneurysm though limited by patient motion. ANATOMIC VARIANTS: None. IMPRESSION: MRI HEAD: Limited 3 sequence MRI of the head without acute intracranial process. MRA HEAD: Mildly motion degraded examination without emergent large vessel occlusion or severe stenosis. Electronically Signed   By: Elon Alas M.D.   On: 03/09/2017 18:04     ELGERGAWY, DAWOOD M.D on 03/11/2017 at 12:26 PM  Between 7am to 7pm - Pager - [phone]  After 7pm go to [email or web address] - password [facility]  [facility] -  Office  [phone]

## 2017-03-11 NOTE — Consult Note (Signed)
Consultation Note Date: 03/11/2017   Patient Name: Ryan Ware  DOB: October 04, 1951  MRN: [id number]  Age / Sex: 66 y.o., male  PCP: [facility] Referring Physician: Albertine Patricia, MD  Reason for Consultation: Establishing goals of care and Psychosocial/spiritual support  HPI/Patient Profile: 66 y.o. male  with past medical history of anemia, prostate cancer with mets to bone, hep C admitted on 03/09/2017 with fever and pain.   Clinical Assessment and Goals of Care: [name] is resting quietly in bed. He will briefly make but not keep eye contact. He appears very frail and weak, with a small voice. Present at bedside today are brothers [name] and [name]. We talk about Mr. Murri goals, and his functional status at home.  Family states that he usually weighs about 240 pounds, but he has had market weight loss since last March. [name] states that he and [name] take [name] back and forth to his doctors appointments, under very involved in his care. [name] states that he recently lost his wife to an aggressive breast cancer, she had hospice.  [name] states that [name] is due for a follow-up imaging with radiation oncology on May 10. Family states that they would like to have concurrent palliative radiation and hospice services in the home. We discuss symptom management, and I schedule [name] pain medication with breakthrough PRNs.  Bowel regimen seems to be effective at this time. Brother [name] has the benefit of experience, and states he is comfortable with hospice, and knows what to expect as his brother continues to decline.  Mr. Howington states that he knows what is happening to him and has accepted where he is. Brother [name] states, "come on now, you know you've got to fight". We talk about the fact that [name] may get tired of taking treatments, and  that this does not mean he has given up. [name] agrees.  Healthcare power of attorney NEXT OF KIN - [name] states he would like for his brothers [name] and [name] to make choices together.   SUMMARY OF RECOMMENDATIONS   Continue to treat the treatable, no extraordinary measures such as CPR.  Patient and family are interested in continuing with treatments, AND enlisting the aid of HOSPICE.   Code Status/Advance Care Planning:  DNR  symptom Management:   changed oxycodone from PRN to scheduled, added PRN breakthrough pain  Palliative Prophylaxis:   Frequent Pain Assessment and Turn Reposition  Additional Recommendations (Limitations, Scope, Preferences):  Continue to treat the treatable, no extraordinary measures such as CPR.  Patient and family are interested in continuing with treatments, AND enlisting the aid of HOSPICE.   Psycho-social/Spiritual:   Desire for further Chaplaincy support:no  Additional Recommendations: Caregiving  Support/Resources  Prognosis:   < 6 months or less would not be surprising based on prostate cancer with metastatic burden to bone, frailty, weight loss, 4 hospitalizations in the last 6 months.  Discharge Planning: Home with current aid services. We discussed the benefits of hospice in home.  Primary Diagnoses: Present on Admission: . Sepsis ([place]) . Elevated LFTs . Prostate cancer metastatic to bone ([place]) . Protein-calorie malnutrition, severe . Thrombocytopenia ([place]) . Altered mental status, unspecified   I have reviewed the medical record, interviewed the patient and family, and examined the patient. The following aspects are pertinent.  Past Medical History:  Diagnosis Date  . Anemia   . Bone cancer ([place])    prostate ca with spine mets  . Elevated LFTs   . Hepatitis C   . Prostate cancer ([place])   . Thrombocytopenia [facility])    Social History   Social History  . Marital status: Single    Spouse name: N/A  . Number  of children: N/A  . Years of education: N/A   Occupational History  . disabled    Social History Main Topics  . Smoking status: Current Every Day Smoker    Packs/day: 0.50    Years: 54.00    Types: Cigarettes  . Smokeless tobacco: Never Used  . Alcohol use No     Comment: Pt states he use to drink and last year was his last drink  . Drug use: No     Comment: remote h/o use, last use about a year ago  . Sexual activity: No   Other Topics Concern  . None   Social History Narrative  . None   Family History  Problem Relation Age of Onset  . Hypertension Mother   . Osteoarthritis Father   . Cancer Neg Hx    Scheduled Meds: . dexamethasone  4 mg Oral Q12H  . docusate sodium  100 mg Oral BID  . feeding supplement (ENSURE ENLIVE)  237 mL Oral TID BM  . folic acid  1 mg Oral Daily  . multivitamin with minerals  1 tablet Oral Daily  . oxyCODONE  15 mg Oral Q6H  . oxyCODONE  15 mg Oral Once  . pantoprazole  40 mg Oral Daily   Continuous Infusions: . sodium chloride    . lactated ringers 100 mL/hr at 03/11/17 1052   PRN Meds:.acetaminophen **OR** acetaminophen, morphine injection, ondansetron **OR** ondansetron (ZOFRAN) IV, polyethylene glycol Medications Prior to Admission:  Prior to Admission medications   Medication Sig Start Date End Date Taking? Authorizing Provider  degarelix (FIRMAGON) 120 MG injection Inject 240 mg into the skin every 6 (six) months.    Yes Historical Provider, MD  denosumab (PROLIA) 60 MG/ML SOLN injection Inject 60 mg into the skin every 6 (six) months. Administer in upper arm, thigh, or abdomen   Yes Historical Provider, MD  docusate sodium (COLACE) 100 MG capsule Take 100 mg by mouth daily.    Yes Historical Provider, MD  enzalutamide Gillermina Phy) 40 MG capsule Take 160 mg by mouth daily.   Yes Historical Provider, MD  folic acid (FOLVITE) 1 MG tablet Take 1 mg by mouth daily.   Yes Historical Provider, MD  Multiple Vitamin (MULTIVITAMIN WITH  MINERALS) TABS tablet Take 1 tablet by mouth daily.   Yes Historical Provider, MD  omeprazole (PRILOSEC) 20 MG capsule Take 20 mg by mouth daily.   Yes Historical Provider, MD  oxyCODONE (ROXICODONE) 15 MG immediate release tablet Take 15 mg by mouth every 6 (six) hours as needed for pain.   Yes Historical Provider, MD  polyethylene glycol (MIRALAX / GLYCOLAX) packet Take 17 g by mouth daily as needed for mild constipation.    Yes Historical Provider, MD  sodium chloride (OCEAN) 0.65 % SOLN nasal spray Place  1 spray into both nostrils as needed for congestion.   Yes Historical Provider, MD   Allergies  Allergen Reactions  . Acetaminophen Other (See Comments)    Liver problems  . Ibuprofen     Liver conditions   Review of Systems  Unable to perform ROS: Acuity of condition    Physical Exam  Constitutional: He is oriented to person, place, and time. No distress.  Frail and thin, temporal wasting, makes but does not keep eye contact, calm and cooperative.  HENT:  Head: Normocephalic and atraumatic.  Temporal wasting  Cardiovascular: Normal rate.   Rate 110's at times  Pulmonary/Chest: Effort normal. No respiratory distress.  Abdominal: Soft. He exhibits no distension.  Musculoskeletal: He exhibits no edema.  Severe muscle wasting  Neurological: He is oriented to person, place, and time.  Skin: Skin is warm and dry.  Nursing note and vitals reviewed.   Vital Signs: BP 125/69   Pulse (!) 110   Temp 99.2 F (37.3 C) (Oral)   Resp 18   Ht 6' 5.5" (1.969 m)   Wt 65.1 kg (143 lb 8 oz) Comment: patient stated he was not able to stand   SpO2 94%   BMI 16.80 kg/m  Pain Assessment: No/denies pain   Pain Score: 7    SpO2: SpO2: 94 % O2 Device:SpO2: 94 % O2 Flow Rate: .   IO: Intake/output summary:  Intake/Output Summary (Last 24 hours) at 03/11/17 1226 Last data filed at 03/11/17 3335  Gross per 24 hour  Intake          3343.33 ml  Output              550 ml  Net           2793.33 ml    LBM: Last BM Date: 03/09/17 Baseline Weight: Weight: 67.6 kg (149 lb) Most recent weight: Weight: 65.1 kg (143 lb 8 oz) (patient stated he was not able to stand )     Palliative Assessment/Data:   Flowsheet Rows     Most Recent Value  Intake Tab  Referral Department  Hospitalist  Unit at Time of Referral  Med/Surg Unit  Palliative Care Primary Diagnosis  Sepsis/Infectious Disease  Date Notified  03/11/17  Palliative Care Type  New Palliative care  Reason for referral  Clarify Goals of Care  Date of Admission  03/09/17  Date first seen by Palliative Care  03/11/17  # of days Palliative referral response time  0 Day(s)  # of days IP prior to Palliative referral  2  Clinical Assessment  Palliative Performance Scale Score  40%  Pain Max last 24 hours  9  Pain Min Last 24 hours  2  Dyspnea Max Last 24 Hours  Not able to report  Dyspnea Min Last 24 hours  Not able to report  Psychosocial & Spiritual Assessment  Palliative Care Outcomes  Patient/Family meeting held?  Yes  Who was at the meeting?  Patient and brothers [name] and [name]  Palliative Care Outcomes  Improved pain interventions, Clarified goals of care, Provided psychosocial or spiritual support, Counseled regarding hospice  Patient/Family wishes: Interventions discontinued/not started   Mechanical Ventilation      Time In: 1140 Time Out: 1230 Time Total: 50 minutes Greater than 50%  of this time was spent counseling and coordinating care related to the above assessment and plan.  Signed by: Drue Novel, NP   Please contact Palliative Medicine Team phone at [phone] for questions  and concerns.  For individual provider: See Shea Evans

## 2017-03-12 LAB — BASIC METABOLIC PANEL
ANION GAP: 10 (ref 5–15)
BUN: 15 mg/dL (ref 6–20)
CO2: 25 mmol/L (ref 22–32)
Calcium: 8.3 mg/dL — ABNORMAL LOW (ref 8.9–10.3)
Chloride: 98 mmol/L — ABNORMAL LOW (ref 101–111)
Creatinine, Ser: 0.47 mg/dL — ABNORMAL LOW (ref 0.61–1.24)
Glucose, Bld: 148 mg/dL — ABNORMAL HIGH (ref 65–99)
POTASSIUM: 3.5 mmol/L (ref 3.5–5.1)
SODIUM: 133 mmol/L — AB (ref 135–145)

## 2017-03-12 LAB — CBC
HCT: 25.9 % — ABNORMAL LOW (ref 39.0–52.0)
HEMOGLOBIN: 8.5 g/dL — AB (ref 13.0–17.0)
MCH: 29.6 pg (ref 26.0–34.0)
MCHC: 32.8 g/dL (ref 30.0–36.0)
MCV: 90.2 fL (ref 78.0–100.0)
PLATELETS: 79 10*3/uL — AB (ref 150–400)
RBC: 2.87 MIL/uL — AB (ref 4.22–5.81)
RDW: 17.5 % — ABNORMAL HIGH (ref 11.5–15.5)
WBC: 5.7 10*3/uL (ref 4.0–10.5)

## 2017-03-12 MED ORDER — DOCUSATE SODIUM 100 MG PO CAPS: 100.0000 mg | ORAL_CAPSULE | Freq: Four times a day (QID) | ORAL | 0 refills | Status: AC

## 2017-03-12 MED ORDER — DOCUSATE SODIUM 100 MG PO CAPS
100.0000 mg | ORAL_CAPSULE | Freq: Four times a day (QID) | ORAL | Status: DC
  Administered 2017-03-12 (×2): 100 mg via ORAL
  Filled 2017-03-12: qty 1

## 2017-03-12 MED ORDER — BISACODYL 10 MG RE SUPP
10.0000 mg | Freq: Every day | RECTAL | Status: DC | PRN
  Administered 2017-03-12: 10 mg via RECTAL
  Filled 2017-03-12: qty 1

## 2017-03-12 MED ORDER — ENSURE ENLIVE PO LIQD: 237.0000 mL | Freq: Three times a day (TID) | ORAL | 12 refills | Status: AC

## 2017-03-12 MED ORDER — PANTOPRAZOLE SODIUM 40 MG PO TBEC: 40.0000 mg | DELAYED_RELEASE_TABLET | Freq: Every day | ORAL | 0 refills | Status: AC

## 2017-03-12 MED ORDER — DOCUSATE SODIUM 100 MG PO CAPS: 100.0000 mg | ORAL_CAPSULE | Freq: Four times a day (QID) | ORAL | Status: DC

## 2017-03-12 MED ORDER — BISACODYL 10 MG RE SUPP: 10.0000 mg | Freq: Every day | RECTAL | 0 refills | Status: AC | PRN

## 2017-03-12 MED ORDER — DEXAMETHASONE 2 MG PO TABS: ORAL_TABLET | ORAL | 0 refills | Status: AC

## 2017-03-12 NOTE — Evaluation (Signed)
Physical Therapy Evaluation Patient Details Name: Ryan Ware MRN: [id number] DOB: 01-26-1951 Today's Date: 03/12/2017   History of Present Illness  66 y.o. 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  During that hospitalization, his evaluation for sepsis was negative and he was found to have worsening metastatic disease in his spine; he was offered oncology consultation but preferred to go to the [place] instead.  Dx: sepsis, and also CT ordered by Dr. Thurnell Garbe which showed apparent new acute infarct    Clinical Impression  Pt received sitting on the EOB, and was agreeable to PT evaluation.  Pt states he uses his rollator walker for ambulation at all times at home.  He lives alone, but his brother stops in every day.  During today's PT evaluation, he does demonstrate poor safety awareness with placement of the [facility] and IV pole and he seemed to be fixated with performing transfer in this manner.  He was able to ambulate 40ft with RW and min guard, which is actually further than he has ambulated during previous hospitalizations.  He expressed that he wishes to resume the HHPT that he was having prior to this admission.      Follow Up Recommendations Home health PT    Equipment Recommendations  None recommended by PT    Recommendations for Other Services       Precautions / Restrictions Precautions Precautions: Fall Precaution Comments: due to poor safety awarenss.  Restrictions Weight Bearing Restrictions: No      Mobility  Bed Mobility                  Transfers Overall transfer level: Needs assistance Equipment used: Rolling walker (2 wheeled) Transfers: Sit to/from Stand Sit to Stand: Supervision         General transfer comment: Pt demonstrates poor safety  awareness with transfer onto the [facility].  Instead of having the [facility] directly next to the bed he prefers to place it about 3 ft in front of him and off to the left.  Pt was unable to have a BM.    Ambulation/Gait Ambulation/Gait assistance: Min guard Ambulation Distance (Feet): 60 Feet Assistive device: Rolling walker (2 wheeled) Gait Pattern/deviations: Step-through pattern     General Gait Details: limited due to fatigue, and RN coming to bring his pain medication.   Stairs            Wheelchair Mobility    Modified Rankin (Stroke Patients Only)       Balance Overall balance assessment: History of Falls;Needs assistance Sitting-balance support: Bilateral upper extremity supported;Feet supported Sitting balance-Leahy Scale: Good     Standing balance support: Bilateral upper extremity supported Standing balance-Leahy Scale: Fair                               Pertinent Vitals/Pain Pain Assessment:  (pt does not report any pain during PT evaluation today. )    Home Living   Living Arrangements: Alone Available Help at Discharge: Available PRN/intermittently;Family (brother comes by every day to assist with running errands.  Pt states he is receiving HHPT, and also an aide. ) Type of Home: Apartment Home Access: Stairs to enter   Entrance Stairs-Number of Steps: 2 steps Home Layout: Two level;Able to  live on main level with bedroom/bathroom Home Equipment: Bedside commode;Cane - single point;Walker - 2 wheels;Walker - 4 wheels      Prior Function Level of Independence: Independent with assistive device(s)   Gait / Transfers Assistance Needed: Pt uses rollator walker for ambulation at all times.             Hand Dominance   Dominant Hand: Right    Extremity/Trunk Assessment   Upper Extremity Assessment Upper Extremity Assessment: Generalized weakness    Lower Extremity Assessment Lower Extremity Assessment: Generalized weakness (Noted muscle  wasting)       Communication      Cognition Arousal/Alertness: Awake/alert   Overall Cognitive Status: Impaired/Different from baseline Area of Impairment: Safety/judgement                         Safety/Judgement: Decreased awareness of safety            General Comments      Exercises     Assessment/Plan    PT Assessment Patient needs continued PT services  PT Problem List Decreased strength;Decreased activity tolerance;Decreased balance;Decreased mobility;Decreased cognition;Decreased safety awareness       PT Treatment Interventions DME instruction;Gait training;Stair training;Functional mobility training;Therapeutic activities;Therapeutic exercise;Balance training;Patient/family education    PT Goals (Current goals can be found in the Care Plan section)  Acute Rehab PT Goals Patient Stated Goal: To return home PT Goal Formulation: With patient Time For Goal Achievement: 03/26/17 Potential to Achieve Goals: Fair    Frequency Min 3X/week   Barriers to discharge Decreased caregiver support Pt lives alone with 4 hospital admissions in the past 6 months.     Co-evaluation               End of Session Equipment Utilized During Treatment: Gait belt Activity Tolerance: Patient limited by fatigue Patient left: in chair;with call bell/phone within reach Nurse Communication: Mobility status Audrea Muscat, RN aware of pt's mobility status, and mobiltiy sheet left hanging in pt's room. ) PT Visit Diagnosis: Muscle weakness (generalized) (M62.81);Other abnormalities of gait and mobility (R26.89)    Time: 1145-1210 PT Time Calculation (min) (ACUTE ONLY): 25 min   Charges:   PT Evaluation $PT Eval Low Complexity: 1 Procedure PT Treatments $Gait Training: 8-22 mins   PT G Codes:   PT G-Codes **NOT FOR INPATIENT CLASS** Functional Assessment Tool Used: AM-PAC 6 Clicks Basic Mobility;Clinical judgement Functional Limitation: Mobility: Walking and  moving around Mobility: Walking and Moving Around Current Status (Y7741): At least 20 percent but less than 40 percent impaired, limited or restricted Mobility: Walking and Moving Around Goal Status [phone]): At least 1 percent but less than 20 percent impaired, limited or restricted    Beth Tannia Contino, PT, DPT X: [phone]

## 2017-03-12 NOTE — Care Management Note (Addendum)
Case Management Note  Patient Details  Name: Ryan Ware MRN: [id number] Date of Birth: 1950-12-11   Expected Discharge Date:      03/12/2017            Expected Discharge Plan:  [place]  In-House Referral:  NA  Discharge planning Services  CM Consult  Post Acute Care Choice:  Home Health Choice offered to:  Patient  HH Arranged:  PT [place]:     Status of Service:  Completed, signed off   Additional Comments: Pt will DC home today. Pt asking about SNF but pt is not recommened by PT for SNF. Explained his [employer] will not pay for SNF and he is ready for DC today. HH PT will be referred and will be set up through his BC[employer], we will send all referrals and recommendations to the [place] and will request they take over payment for [facility]. Tommi Rumps of [place] aware of referral and will obtain pt info from chart. Pt aware HH has 48hrs to make first visit. Pt's have appointment at [facility] next week.   Sherald Barge, RN 03/12/2017, 1:42 PM

## 2017-03-12 NOTE — Progress Notes (Signed)
Daily Progress Note   Patient Name: Ryan Ware       Date: 03/12/2017 DOB: Jul 08, 1951  Age: 66 y.o. MRN#: [id number] Attending Physician: Albertine Patricia, MD Primary Care Physician: Heard Date: 03/09/2017  Reason for Consultation/Follow-up: Establishing goals of care, Hospice Evaluation and Psychosocial/spiritual support  Subjective: Ryan Ware is in his room working with PT. He is using a walker to get to the [place] chair. He appears weak and somewhat unstable. He tells me that his pain medication has been effective, but he still needs help with his bowels. He tells me that he usually takes a Colace with every pain pill area I schedule this. He is requesting a Dulcolax suppository at this time. No other questions or concerns. PT continues to work with Ryan Ware. Nursing staff arrives with noon medications. We discuss Ryan Ware case.  Length of Stay: 3  Current Medications: Scheduled Meds:  . dexamethasone  4 mg Oral Q12H  . docusate sodium  100 mg Oral QID  . feeding supplement (ENSURE ENLIVE)  237 mL Oral TID BM  . folic acid  1 mg Oral Daily  . multivitamin with minerals  1 tablet Oral Daily  . oxyCODONE  15 mg Oral Q6H  . pantoprazole  40 mg Oral Daily    Continuous Infusions: . sodium chloride    . lactated ringers 100 mL/hr at 03/12/17 1210    PRN Meds: acetaminophen **OR** acetaminophen, bisacodyl, ondansetron **OR** ondansetron (ZOFRAN) IV, oxyCODONE, polyethylene glycol  Physical Exam  Constitutional: He is oriented to person, place, and time. No distress.  Weak, frail and thin, makes but does not keep eye contact  HENT:  Head: Normocephalic and atraumatic.  Temporal wasting  Cardiovascular: Normal rate and regular  rhythm.   Pulmonary/Chest: Effort normal. No respiratory distress.  Abdominal: Soft. He exhibits no distension.  Musculoskeletal: He exhibits no edema.  Severe muscle wasting  Neurological: He is alert and oriented to person, place, and time.  Skin: Skin is warm and dry.  Vitals reviewed.           Vital Signs: BP 122/75 (BP Location: Right Arm)   Pulse 86   Temp 97.6 F (36.4 C) (Oral)   Resp 18   Ht 6' 5.5" (1.969 m)   Wt 65.1 kg (143 lb  8 oz) Comment: patient stated he was not able to stand   SpO2 98%   BMI 16.80 kg/m  SpO2: SpO2: 98 % O2 Device: O2 Device: Not Delivered O2 Flow Rate:    Intake/output summary:  Intake/Output Summary (Last 24 hours) at 03/12/17 1335 Last data filed at 03/12/17 0900  Gross per 24 hour  Intake          1936.67 ml  Output              400 ml  Net          1536.67 ml   LBM: Last BM Date: 03/08/17 Baseline Weight: Weight: 67.6 kg (149 lb) Most recent weight: Weight: 65.1 kg (143 lb 8 oz) (patient stated he was not able to stand )       Palliative Assessment/Data:    Flowsheet Rows     Most Recent Value  Intake Tab  Referral Department  Hospitalist  Unit at Time of Referral  Med/Surg Unit  Palliative Care Primary Diagnosis  Sepsis/Infectious Disease  Date Notified  03/11/17  Palliative Care Type  New Palliative care  Reason for referral  Clarify Goals of Care  Date of Admission  03/09/17  Date first seen by Palliative Care  03/11/17  # of days Palliative referral response time  0 Day(s)  # of days IP prior to Palliative referral  2  Clinical Assessment  Palliative Performance Scale Score  40%  Pain Max last 24 hours  9  Pain Min Last 24 hours  2  Dyspnea Max Last 24 Hours  Not able to report  Dyspnea Min Last 24 hours  Not able to report  Psychosocial & Spiritual Assessment  Palliative Care Outcomes  Patient/Family meeting held?  Yes  Who was at the meeting?  Patient and brothers Ryan Ware and Ryan Ware  Palliative Care Outcomes   Improved pain interventions, Clarified goals of care, Provided psychosocial or spiritual support, Counseled regarding hospice  Patient/Family wishes: Interventions discontinued/not started   Mechanical Ventilation      Patient Active Problem List   Diagnosis Date Noted  . Palliative care encounter   . Goals of care, counseling/discussion   . Encounter for hospice care discussion   . Altered mental status, unspecified 03/09/2017  . Sepsis ([place]) 02/25/2017  . Elevated LFTs 02/25/2017  . Thrombocytopenia ([place]) 01/28/2017  . Symptomatic anemia 01/28/2017  . Epistaxis 01/28/2017  . Pancytopenia ([place]) 01/28/2017  . Protein-calorie malnutrition, severe 12/20/2016  . Cord compression ([place]) 12/19/2016  . Prostate cancer metastatic to bone ([place]) 12/19/2016  . Hyponatremia 12/19/2016  . Hepatitis C without hepatic coma 12/19/2016    Palliative Care Assessment & Plan   Patient Profile: 66 y.o. male  with past medical history of anemia, prostate cancer with mets to bone, hep C admitted on 03/09/2017 with fever and pain.   Assessment: Prostate cancer with metastatic burden to bone; Ryan Ware has continued palliative radiation treatments to spine for pain relief. He is scheduled for a follow-up appointment with radiation oncology 5/10. His brothers will ensure he makes this appointment. They plan to reimage, to decide about further treatment options. Ryan Ware has become weaker, lost weight. He is interested in enlisting the aid of hospice at this time, but wishes to continue with palliative radiation if recommended. Hospitalist has added Decadron for additional pain relief.  Recommendations/Plan: Continue to treat the treatable, no extraordinary measures such as CPR.  Patient and family are interested in continuing with treatments, AND enlisting the  aid of HOSPICE.   Goals of Care and Additional Recommendations:  Limitations on Scope of Treatment: Continue to treat the treatable, no  extraordinary measures such as CPR.  Patient and family are interested in continuing with treatments, AND enlisting the aid of HOSPICE.   Code Status:    Code Status Orders        Start     Ordered   03/09/17 1954  Do not attempt resuscitation (DNR)  Continuous    Question Answer Comment  In the event of cardiac or respiratory ARREST Do not call a "code blue"   In the event of cardiac or respiratory ARREST Do not perform Intubation, CPR, defibrillation or ACLS   In the event of cardiac or respiratory ARREST Use medication by any route, position, wound care, and other measures to relive pain and suffering. May use oxygen, suction and manual treatment of airway obstruction as needed for comfort.      03/09/17 1954    Code Status History    Date Active Date Inactive Code Status Order ID Comments User Context   02/25/2017  9:24 PM 02/27/2017 11:09 PM DNR [id number]  Karmen Bongo, MD Inpatient   01/28/2017  1:56 PM 01/30/2017  7:03 PM Full Code [id number]  Samuella Cota, MD Inpatient   12/19/2016  4:23 AM 12/21/2016  8:37 PM Full Code [id number]  Edwin Dada, MD ED    Advance Directive Documentation     Most Recent Value  Type of Advance Directive  Healthcare Power of Attorney  Pre-existing out of facility DNR order (yellow form or pink MOST form)  -  "MOST" Form in Place?  -       Prognosis:   < 6 months or less would not be surprising based on prostate cancer with metastatic burden to bone, frailty, weight loss, 4 hospitalizations in the last 6 months.  Discharge Planning:  Home with current aid services. We discussed the benefits of hospice iin home.    Care plan was discussed with nursing staff, case manager, social worker, and Dr. Waldron Labs.    Thank you for allowing the Palliative Medicine Team to assist in the care of this patient.   Time In: 1155 Time Out: 1220 Total Time 25 minutes Prolonged Time Billed  no       Greater than 50%  of this time was spent  counseling and coordinating care related to the above assessment and plan.  Drue Novel, NP  Please contact Palliative Medicine Team phone at [phone] for questions and concerns.

## 2017-03-12 NOTE — Discharge Summary (Signed)
Ryan Ware, is a 66 y.o. male  DOB February 06, 1951  MRN [id number].  Admission date:  03/09/2017  Admitting Physician  Karmen Bongo, MD  Discharge Date:  03/12/2017   Primary MD  [facility]  Recommendations for primary care physician for things to follow:  - please check CBC, BMP during next visit. - Follow with his primary oncology at [facility] next week   Admission Diagnosis  Prostate cancer ([place]) [C61] Fever, unspecified fever cause [R50.9] Cerebrovascular accident (CVA), unspecified mechanism ([place]) [I63.9]   Discharge Diagnosis  Prostate cancer ([place]) [C61] Fever, unspecified fever cause [R50.9] Cerebrovascular accident (CVA), unspecified mechanism ([place]) [I63.9]    Principal Problem:   Sepsis ([place]) Active Problems:   Prostate cancer metastatic to bone ([place])   Protein-calorie malnutrition, severe   Thrombocytopenia ([facility])   Elevated LFTs   Altered mental status, unspecified   Palliative care encounter   Goals of care, counseling/discussion   Encounter for hospice care discussion      Past Medical History:  Diagnosis Date  . Anemia   . Bone cancer ([place])    prostate ca with spine mets  . Elevated LFTs   . Hepatitis C   . Prostate cancer ([place])   . Thrombocytopenia ([place])     Past Surgical History:  Procedure Laterality Date  . HAND SURGERY    . PROSTATE BIOPSY         History of present illness and  Hospital Course:     Kindly see H&P for history of present illness and admission details, please review complete Labs, Consult reports and Test reports for all details in brief  HPI  from the history and physical done on the day of admission 03/09/2017 HPI: Ryan Ware is a 66 y.o. male with medical history significant of significant of metastatic prostate cancer to the spine with Hep C and elevated LFTs as well as recent admissions for anemia and  thrombocytopenia (3/14-16) and for a presentation very similar to today (4/11-13).  During that hospitalization, his evaluation for sepsis was negative and he was found to have worsening metastatic disease in his spine; he was offered oncology consultation but preferred to go to the [place] instead.  When asked why he presented today (he was unaccompanied at the time of my evaluation), he replied "I don't know."  + fever.  Started feeling bad early this AM.  "I hurt all the time."  Nothing was really different, just didn't feel well.  "I can't remember."  +weakness of both arms and legs, no specific finding.  No dysphagia.  No dysarhtria.  Slight chest pain.  No cough.  No abdominal pain or n'v/d.  HPI per Dr. Thurnell Garbe: Pt was seen at 1505. Per pt and his family, c/o gradual onset and persistence of constant "confusion" for the past 2 days. Has been associated with "strong urine smell." Family states they went to [place] office today to have blood work completed, when pt's son asked the provider there to "check him  out." Provider at [facility] office told pt's family he "had a fever to 100.2" and "his heart rate was racing." States they were told to bring him to the ED "to get admitted when he has a fever because his WBC is low." Pt's only complaint is his usual chronic LBP and "I might have a cough." Denies any change in his usual pain pattern. Denies focal motor weakness, no tingling/numbness in extremities, no CP/SOB, no abd pain, no N/V/D. 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 LD XRT approximately 1 month ago, no chemotherapy.    ED Course:code sepsis - applied cooling blanket for fever, Zosyn/Vanc, uncertain source of infection.  Head CT with ?small new acute infarct so MRI ordered    Hospital Course  66 y.o.malewith medical history significant of significant of metastatic prostate cancer to the spine with Hep C and elevated LFTs , with recent admissions due [facility] and [facility] for anemia for  Which she required transfusions, presents with complaints of generalized weakness, temperature 100.2., he received Abx empirically in ED, but was stopped on admission giving no evidence of infection, he was monitored off antibiotics during that hospital stay, with no recurrence of fever or evidence of infection.  Fever - so far  no evidence of infection, urinalysis and chest x-ray with no evidence of infection, blood culture with no growth to date(3 days), he has defervesced, and entered of antibiotic during hospital stay, continue to monitor off antibiotics, assessment temperature during hospital stay was day of admission on 4/24 of 100.  Metastatic prostate cancer - To continue  follow-up with his oncology team at [facility] after discharge, discussed with his primary oncologist Dr. Leilani Merl, he is not neutropenic, so continue Xtandi on discharge.  Failure to thrive/severe protein calorie malnutrition - PT has been consulted, patient with progressive weakness, but nothing focal, continue supplement - Anemia contributing to his weakness, is fused 1 unit PRBC on 4/26, hemoglobin is 8.5 on discharge  Anemia of chronic illness - required multiple blood transfusions in the past,received  1 unit PRBC.  Acute Encephalopathy -Resolved. -MRI/MRA head without evidence for CVA, metastases or significant stenosis  Chronic pain syndrome - Suspect secondary to malignancy with bone metastasis, continue with home pain regimen, palliative medicine consulted, will start on Decadron if it helps with his pain. Discharged on Decadron taper over Days  Thrombocytopenia - chronic at baseline, is likely due to bone metastasis - Discussed with his primary oncologist at [facility], patient having chronic low level DIC    Discharge Condition:  Stable at time of discharge, but patient with  metastatic prostate cancer, high-risk for readmission   Follow UP  Follow-up Information    [facility] Follow up.   Contact information: [place] [address] [phone]             Discharge Instructions  and  Discharge Medications    Discharge Instructions    Discharge instructions    Complete by:  As directed    Follow with Primary MD [facility] in 7 days   Get CBC, CMP, checked  by Primary MD next visit.    Activity: As tolerated with Full fall precautions use walker/cane & assistance as needed   Disposition Home    Diet: regular diet   On your next visit with your primary care physician please Get Medicines reviewed and adjusted.   Please request your Prim.MD to go over all Hospital Tests and Procedure/Radiological results  at the follow up, please get all Hospital records sent to your Prim MD by signing hospital release before you go home.   If you experience worsening of your admission symptoms, develop shortness of breath, life threatening emergency, suicidal or homicidal thoughts you must seek medical attention immediately by calling 911 or calling your MD immediately  if symptoms less severe.  You Must read complete instructions/literature along with all the possible adverse reactions/side effects for all the Medicines you take and that have been prescribed to you. Take any new Medicines after you have completely understood and accpet all the possible adverse reactions/side effects.   Do not drive, operating heavy machinery, perform activities at heights, swimming or participation in water activities or provide baby sitting services if your were admitted for syncope or siezures until you have seen by Primary MD or a Neurologist and advised to do so again.  Do not drive when taking Pain medications.    Do not take more than prescribed Pain, Sleep and Anxiety Medications  Special Instructions: If you have smoked or chewed Tobacco  in the last 2 yrs please stop smoking, stop any regular Alcohol  and or  any Recreational drug use.  Wear Seat belts while driving.   Please note  You were cared for by a hospitalist during your hospital stay. If you have any questions about your discharge medications or the care you received while you were in the hospital after you are discharged, you can call the unit and asked to speak with the hospitalist on call if the hospitalist that took care of you is not available. Once you are discharged, your primary care physician will handle any further medical issues. Please note that NO REFILLS for any discharge medications will be authorized once you are discharged, as it is imperative that you return to your primary care physician (or establish a relationship with a primary care physician if you do not have one) for your aftercare needs so that they can reassess your need for medications and monitor your lab values.   Increase activity slowly    Complete by:  As directed      Allergies as of 03/12/2017      Reactions   Acetaminophen Other (See Comments)   Liver problems   Ibuprofen    Liver conditions      Medication List    STOP taking these medications   oxyCODONE 15 MG immediate release tablet Commonly known as:  ROXICODONE     TAKE these medications   bisacodyl 10 MG suppository Commonly known as:  DULCOLAX Place 1 suppository (10 mg total) rectally daily as needed for moderate constipation (please check rectal vault for stool burden).   degarelix 120 MG injection Commonly known as:  FIRMAGON Inject 240 mg into the skin every 6 (six) months.   denosumab 60 MG/ML Soln injection Commonly known as:  PROLIA Inject 60 mg into the skin every 6 (six) months. Administer in upper arm, thigh, or abdomen   dexamethasone 2 MG tablet Commonly known as:  DECADRON Please take 4 mg oral 2 times daily for 3 days, then 2 mg oral 2 times daily for 3 days, then 2 mg oral daily for 3 days, then stop.   docusate sodium 100 MG capsule Commonly known as:   COLACE Take 1 capsule (100 mg total) by mouth 4 (four) times daily. What changed:  when to take this   enzalutamide 40 MG capsule Commonly known as:  XTANDI Take 160  mg by mouth daily.   feeding supplement (ENSURE ENLIVE) Liqd Take 237 mLs by mouth 3 (three) times daily between meals.   folic acid 1 MG tablet Commonly known as:  FOLVITE Take 1 mg by mouth daily.   multivitamin with minerals Tabs tablet Take 1 tablet by mouth daily.   omeprazole 20 MG capsule Commonly known as:  PRILOSEC Take 20 mg by mouth daily.   pantoprazole 40 MG tablet Commonly known as:  PROTONIX Take 1 tablet (40 mg total) by mouth daily. Start taking on:  03/13/2017   polyethylene glycol packet Commonly known as:  MIRALAX / GLYCOLAX Take 17 g by mouth daily as needed for mild constipation.   sodium chloride 0.65 % Soln nasal spray Commonly known as:  OCEAN Place 1 spray into both nostrils as needed for congestion.         Diet and Activity recommendation: See Discharge Instructions above   Consults obtained -  none   Major procedures and Radiology Reports - PLEASE review detailed and final reports for all details, in brief -      Dg Chest 2 View  Result Date: 03/09/2017 CLINICAL DATA:  Fevers EXAM: CHEST  2 VIEW COMPARISON:  02/25/2017 FINDINGS: Cardiac shadow is within normal limits. The lungs are well aerated bilaterally. Some mild left basilar atelectasis is seen with elevation of the left hemidiaphragm. No sizable effusion is seen. Scattered sclerotic areas are noted within the bony structures consistent with the given clinical history of prostate carcinoma with bony metastatic disease. IMPRESSION: New left basilar atelectasis. Electronically Signed   By: Inez Catalina M.D.   On: 03/09/2017 15:43   Dg Chest 2 View  Result Date: 02/25/2017 CLINICAL DATA:  Back and chest pain for 3 days. EXAM: CHEST  2 VIEW COMPARISON:  Chest CT 11/14/2016 FINDINGS: The cardiac silhouette, mediastinal  and hilar contours are within normal limits and stable. There is mild tortuosity and ectasia of the thoracic aorta. Low lung volumes with vascular crowding and streaky atelectasis but no definite infiltrates or effusions. Diffuse osteoblastic metastatic prostate cancer again demonstrated. No obvious pathologic fracture. IMPRESSION: No acute cardiopulmonary findings. Diffuse osseous metastatic disease without obvious pathologic compression fracture in the thoracic spine. Electronically Signed   By: Marijo Sanes M.D.   On: 02/25/2017 15:48   Dg Lumbar Spine Complete  Result Date: 02/25/2017 CLINICAL DATA:  Back pain for 3 days. Prostate cancer with bone metastasis. Receiving radiation therapy. EXAM: LUMBAR SPINE - COMPLETE 4+ VIEW COMPARISON:  Radiograph 01/28/2017 FINDINGS: There is sclerosis of the vertebral bodies. No acute loss vertebral body height or disc height. Endplate spurring at D3-U2. No pars fracture. IMPRESSION: 1. No acute findings lumbar spine. 2. Disc osteophytic disease from L4-S1. 3. Diffuse bone sclerotic metastasis. Electronically Signed   By: Suzy Bouchard M.D.   On: 02/25/2017 15:47   Ct Head Wo Contrast  Result Date: 03/09/2017 CLINICAL DATA:  Altered mental status and fever. History of prostate carcinoma EXAM: CT HEAD WITHOUT CONTRAST TECHNIQUE: Contiguous axial images were obtained from the base of the skull through the vertex without intravenous contrast. COMPARISON:  None. FINDINGS: Brain: There is age related volume loss. There is no intracranial mass, hemorrhage, extra-axial fluid collection, or midline shift. There is a focal area of decreased attenuation in the anterior limb of the right internal capsule, concerning for a small acute infarct in this area. Elsewhere there is rather minimal small vessel disease in the centra semiovale bilaterally. Vascular: No hyperdense vessel. There  is calcification in each carotid siphon region. Skull: Bony calvarium appears intact.  Sinuses/Orbits: There is mucosal thickening in the sphenoid sinus regions without air-fluid level. There is slight mucosal thickening in several ethmoid air cells. Other visualized paranasal sinuses are clear. Orbits appear symmetric bilaterally. Other: Visualized mastoid air cells are clear. 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 No other evidence of acute appearing infarct. There is age related volume loss with slight periventricular small vessel disease. No hemorrhage or mass effect. Areas of arterial vascular calcification noted. Areas of paranasal sinus disease evident. Electronically Signed   By: Lowella Grip III M.D.   On: 03/09/2017 16:33   Mr Brain Wo Contrast (neuro Protocol)  Result Date: 03/09/2017 CLINICAL DATA:  Febrile, altered mental status. Possible new infarct on recent head CT. History of metastatic prostate cancer. EXAM: MRI HEAD WITHOUT CONTRAST MRA HEAD WITHOUT CONTRAST TECHNIQUE: Sagittal T1, axial coronal diffusion weighted imaging of the brain and surrounding structures were obtained without intravenous contrast. Angiographic images of the head were obtained using MRA technique without contrast. COMPARISON:  None. FINDINGS: MRI HEAD FINDINGS- motion degraded examination, incomplete. Technologist reports the patient attempted to climb at of scanner twice. BRAIN: No reduced diffusion to suggest acute ischemia, hypercellular tumor or typical infection. No hydrocephalus. No mass effect. No abnormal extra-axial fluid collections. VASCULAR: Not assessed. SKULL AND UPPER CERVICAL SPINE: No abnormal sellar expansion. Craniocervical junction maintained. Heterogeneous bone marrow signal global pole for metastatic disease given patient's history of prostate cancer SINUSES/ORBITS: Not assessed. OTHER: None. MRA HEAD FINDINGS- mildly motion degraded examination. ANTERIOR CIRCULATION: Normal flow related enhancement of the included cervical,  petrous, cavernous and supraclinoid internal carotid arteries. Patent anterior communicating artery. Normal flow related enhancement of the anterior and middle cerebral arteries, including distal segments. No definite aneurysm though limited by patient motion. POSTERIOR CIRCULATION: Codominant vertebral artery's. Basilar artery is patent, with normal flow related enhancement of the main branch vessels. Normal flow related enhancement of the posterior cerebral arteries. Small bilateral posterior communicating arteries. No definite aneurysm though limited by patient motion. ANATOMIC VARIANTS: None. IMPRESSION: MRI HEAD: Limited 3 sequence MRI of the head without acute intracranial process. MRA HEAD: Mildly motion degraded examination without emergent large vessel occlusion or severe stenosis. Electronically Signed   By: Elon Alas M.D.   On: 03/09/2017 18:04   Mr Lumbar Spine W Wo Contrast (assess For Abscess, Cord Compression)  Result Date: 02/25/2017 CLINICAL DATA:  Initial evaluation for acute back pain for 3 days. History of metastatic prostate cancer with osseous metastases. Currently undergoing radiation. EXAM: MRI LUMBAR SPINE WITHOUT AND WITH CONTRAST TECHNIQUE: Multiplanar and multiecho pulse sequences of the lumbar spine were obtained without and with intravenous contrast. CONTRAST:  79mL MULTIHANCE GADOBENATE DIMEGLUMINE 529 MG/ML IV SOLN COMPARISON:  Comparison made with previous MRI from 12/18/2016. FINDINGS: Segmentation: Normal segmentation. Lowest well-formed disc is labeled the L5-S1 level. Alignment: Normal alignment with preservation of the normal lumbar lordosis. No listhesis. Vertebrae: Diffuse osseous metastases seen throughout the visualized bone marrow. Overall, changes appears somewhat progressed relative to recent MRI. Vertebral body heights are maintained without associated vertebral body fracture. Extensive metastases with expansile tumor enhancement present within the visualize  sacrum, greater on the right. Would be extremely difficult to exclude a superimposed pathologic fracture at this level. This is incompletely evaluated on this exam. No other associated pathologic fracture. No other significant extra osseous extension of tumor identified. Conus medullaris: Extends to the L2 level  and appears normal. Paraspinal and other soft tissues: Paraspinous soft tissues demonstrate no acute abnormality. Visualized visceral structures within normal limits. Left retroperitoneal/iliac lymph nodes measure up to 17 mm in short axis (series 9, image 28). Disc levels: Diffuse congenital shortening of the pedicles. L1-2: Normal disc. Bilateral facet degeneration. No significant canal or foraminal stenosis. L2-3: Diffuse degenerative disc bulge with disc desiccation and intervertebral disc space narrowing. Epidural lipomatosis. Short pedicles. Bilateral facet degeneration with ligamentum flavum hypertrophy. Stable severe canal stenosis. Thecal sac measures approximately 7 mm in AP diameter. No significant foraminal encroachment. L3-4: Mild disc bulge. Epidural lipomatosis. Short pedicles. Bilateral facet hypertrophy. Resultant severe canal stenosis with the thecal sac measuring 7 mm in AP diameter, similar to previous. No significant foraminal encroachment. L4-5: Left E centric disc bulge. Left greater than right facet arthrosis. Short pedicles with epidural lipomatosis. Resultant severe canal stenosis with the thecal sac measuring 7 mm in AP diameter. Changes relatively stable. No significant foraminal encroachment. L5-S1: Chronic diffuse degenerative disc osteophyte with intervertebral disc space narrowing. Broad posterior component indents the ventral thecal sac, encroaching upon the bilateral lateral recesses. Moderate to advanced bilateral facet arthrosis, worse on the right. Resultant severe subarticular stenosis bilaterally with moderate canal narrowing. Moderate right with mild left foraminal  stenosis. Changes relatively stable. IMPRESSION: 1. Diffuse osseous metastatic disease, overall progressed/worsened in appearance relative to prior MRI from 12/18/2016. No associated pathologic fracture within the lumbar spine. Would be extremely difficult to exclude a superimposed pathologic fracture at the level the sacrum. 2. 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 Overall, changes are stable from previous. 3. Left retroperitoneal/iliac adenopathy as above, concerning for nodal metastatic disease. Electronically Signed   By: Jeannine Boga M.D.   On: 02/25/2017 19:12   Mr Jodene Nam Head (cerebral Arteries)  Result Date: 03/09/2017 CLINICAL DATA:  Febrile, altered mental status. Possible new infarct on recent head CT. History of metastatic prostate cancer. EXAM: MRI HEAD WITHOUT CONTRAST MRA HEAD WITHOUT CONTRAST TECHNIQUE: Sagittal T1, axial coronal diffusion weighted imaging of the brain and surrounding structures were obtained without intravenous contrast. Angiographic images of the head were obtained using MRA technique without contrast. COMPARISON:  None. FINDINGS: MRI HEAD FINDINGS- motion degraded examination, incomplete. Technologist reports the patient attempted to climb at of scanner twice. BRAIN: No reduced diffusion to suggest acute ischemia, hypercellular tumor or typical infection. No hydrocephalus. No mass effect. No abnormal extra-axial fluid collections. VASCULAR: Not assessed. SKULL AND UPPER CERVICAL SPINE: No abnormal sellar expansion. Craniocervical junction maintained. Heterogeneous bone marrow signal global pole for metastatic disease given patient's history of prostate cancer SINUSES/ORBITS: Not assessed. OTHER: None. MRA HEAD FINDINGS- mildly motion degraded examination. ANTERIOR CIRCULATION: Normal flow related enhancement of the included cervical, petrous,  cavernous and supraclinoid internal carotid arteries. Patent anterior communicating artery. Normal flow related enhancement of the anterior and middle cerebral arteries, including distal segments. No definite aneurysm though limited by patient motion. POSTERIOR CIRCULATION: Codominant vertebral artery's. Basilar artery is patent, with normal flow related enhancement of the main branch vessels. Normal flow related enhancement of the posterior cerebral arteries. Small bilateral posterior communicating arteries. No definite aneurysm though limited by patient motion. ANATOMIC VARIANTS: None. IMPRESSION: MRI HEAD: Limited 3 sequence MRI of the head without acute intracranial process. MRA HEAD: Mildly motion degraded examination without emergent large vessel occlusion or severe stenosis. Electronically Signed   By: Thana Farr.D.  On: 03/09/2017 18:04    Micro Results   Recent Results (from the past 240 hour(s))  Urine culture     Status: Abnormal   Collection Time: 03/09/17  3:34 PM  Result Value Ref Range Status   Specimen Description URINE, CLEAN CATCH  Final   Special Requests NONE  Final   Culture (A)  Final    <10,000 COLONIES/mL INSIGNIFICANT GROWTH Performed at [facility], [address] [place], [place] [address]    Report Status 03/11/2017 FINAL  Final  Culture, blood (Routine x 2)     Status: None (Preliminary result)   Collection Time: 03/09/17  3:36 PM  Result Value Ref Range Status   Specimen Description RIGHT ANTECUBITAL  Final   Special Requests   Final    BOTTLES DRAWN AEROBIC AND ANAEROBIC Blood Culture adequate volume   Culture NO GROWTH 3 DAYS  Final   Report Status PENDING  Incomplete  Culture, blood (Routine x 2)     Status: None (Preliminary result)   Collection Time: 03/09/17  3:43 PM  Result Value Ref Range Status   Specimen Description LEFT ANTECUBITAL  Final   Special Requests   Final    BOTTLES DRAWN AEROBIC AND ANAEROBIC Blood Culture adequate  volume   Culture NO GROWTH 3 DAYS  Final   Report Status PENDING  Incomplete       Today   Subjective:   Henok Heacock today has no headache,no chest or abdominal pain, reports abdominal pain is better controlled, he is feeling better today, .  Objective:   Blood pressure 122/75, pulse 86, temperature 97.6 F (36.4 C), temperature source Oral, resp. rate 18, height 6' 5.5" (1.969 m), weight 65.1 kg (143 lb 8 oz), SpO2 98 %.   Intake/Output Summary (Last 24 hours) at 03/12/17 1559 Last data filed at 03/12/17 0900  Gross per 24 hour  Intake              800 ml  Output              400 ml  Net              400 ml    Exam Awake Alert, Oriented X 3, Cachectic, chronically ill-appearing Supple Neck,No JVD Symmetrical Chest wall movement, Good air movement bilaterally, CTAB RRR,No Gallops,Rubs or new Murmurs, No Parasternal Heave +ve B.Sounds, Abd Soft, No tenderness, , No rebound - guarding or rigidity. No Cyanosis, Clubbing or edema, No new Rash or bruise     Data Review   CBC w Diff: Lab Results  Component Value Date   WBC 5.7 03/12/2017   HGB 8.5 (L) 03/12/2017   HCT 25.9 (L) 03/12/2017   PLT 79 (L) 03/12/2017   LYMPHOPCT 11 03/09/2017   MONOPCT 18 03/09/2017   EOSPCT 0 03/09/2017   BASOPCT 1 03/09/2017    CMP: Lab Results  Component Value Date   NA 133 (L) 03/12/2017   K 3.5 03/12/2017   CL 98 (L) 03/12/2017   CO2 25 03/12/2017   BUN 15 03/12/2017   CREATININE 0.47 (L) 03/12/2017   PROT 7.2 03/09/2017   ALBUMIN 2.9 (L) 03/09/2017   BILITOT 2.2 (H) 03/09/2017   ALKPHOS 271 (H) 03/09/2017   AST 117 (H) 03/09/2017   ALT 43 03/09/2017  .   Total Time in preparing paper work, data evaluation and todays exam - 55 minutes  Tenelle Andreason M.D on 03/12/2017 at 3:59 PM  [place]  [phone]     

## 2017-03-12 NOTE — Discharge Instructions (Signed)
Follow with Primary MD [facility] in 7 days   Get CBC, CMP, checked  by Primary MD next visit.    Activity: As tolerated with Full fall precautions use walker/cane & assistance as needed   Disposition Home    Diet: regular diet   On your next visit with your primary care physician please Get Medicines reviewed and adjusted.   Please request your Prim.MD to go over all Hospital Tests and Procedure/Radiological results at the follow up, please get all Hospital records sent to your Prim MD by signing hospital release before you go home.   If you experience worsening of your admission symptoms, develop shortness of breath, life threatening emergency, suicidal or homicidal thoughts you must seek medical attention immediately by calling 911 or calling your MD immediately  if symptoms less severe.  You Must read complete instructions/literature along with all the possible adverse reactions/side effects for all the Medicines you take and that have been prescribed to you. Take any new Medicines after you have completely understood and accpet all the possible adverse reactions/side effects.   Do not drive, operating heavy machinery, perform activities at heights, swimming or participation in water activities or provide baby sitting services if your were admitted for syncope or siezures until you have seen by Primary MD or a Neurologist and advised to do so again.  Do not drive when taking Pain medications.    Do not take more than prescribed Pain, Sleep and Anxiety Medications  Special Instructions: If you have smoked or chewed Tobacco  in the last 2 yrs please stop smoking, stop any regular Alcohol  and or any Recreational drug use.  Wear Seat belts while driving.   Please note  You were cared for by a hospitalist during your hospital stay. If you have any questions about your discharge medications or the care you received while you were in the hospital after you are  discharged, you can call the unit and asked to speak with the hospitalist on call if the hospitalist that took care of you is not available. Once you are discharged, your primary care physician will handle any further medical issues. Please note that NO REFILLS for any discharge medications will be authorized once you are discharged, as it is imperative that you return to your primary care physician (or establish a relationship with a primary care physician if you do not have one) for your aftercare needs so that they can reassess your need for medications and monitor your lab values.

## 2017-03-12 NOTE — Progress Notes (Signed)
Patient and family state understanding of discharge instructions and prescriptions given.

## 2017-03-14 LAB — BPAM RBC
BLOOD PRODUCT EXPIRATION DATE: 201805012359
BLOOD PRODUCT EXPIRATION DATE: 201805232359
ISSUE DATE / TIME: 201804252042
Unit Type and Rh: 7300
Unit Type and Rh: 7300

## 2017-03-14 LAB — TYPE AND SCREEN
ABO/RH(D): B POS
ANTIBODY SCREEN: POSITIVE
DAT, IGG: POSITIVE
Donor AG Type: NEGATIVE
Donor AG Type: NEGATIVE
UNIT DIVISION: 0
Unit division: 0

## 2017-03-14 LAB — CULTURE, BLOOD (ROUTINE X 2)
CULTURE: NO GROWTH
CULTURE: NO GROWTH
SPECIAL REQUESTS: ADEQUATE
Special Requests: ADEQUATE

## 2017-03-24 NOTE — Progress Notes (Addendum)
Ryan Ware. [name] 66 y.o. 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              Pain:Denies pain now, took a pain pill before coming for his appointment. Bowel/Bladder retention or incontinence:Reports a bowel movement every two to three days taking  Colace daily, has prn Miralax and dulcolax suppository Numbness or weakness in extremities : Reports numbness to legs is better still having numbness in hips, legs weakness is improving receiving PT two times a week. Swallowing issues:Denies swallowing issues, has acid reflux, taking his medications with relief.  No evidence of thrush. Current Decadron regimen, if applicable: Decadron 4 mg 2 tablets bid daily as of 03-19-17 Fatigue: Reports fatigue all the time. Skin status:No change in skin to radiation site the  Spine. Nausea/Vomiting:None Appetite is fair eating three meals a day, drinking ensure plus and snacking,"states his appetite needs to improve.   Has a nursing aide that comes to prepare his breakfast.           Weight: Wt Readings from Last 3 Encounters:  03/26/17 140 lb (63.5 kg)  03/09/17 143 lb 8 oz (65.1 kg)  02/25/17 148 lb 13 oz (67.5 kg)           Ambulatory status? Walker? Wheelchair?: Using a cane and walker,          BP 102/65   Pulse (!) 110   Temp 98.3 F (36.8 C) (Oral)   Resp 18   Ht 6' 5.5" (1.969 m)   Wt 140 lb (63.5 kg)   SpO2 100%   BMI 16.39 kg/m

## 2017-03-26 ENCOUNTER — Encounter: Payer: Self-pay | Admitting: Urology

## 2017-03-26 ENCOUNTER — Ambulatory Visit
Admission: RE | Admit: 2017-03-26 | Discharge: 2017-03-26 | Disposition: A | Discharge status: Home / Self Care | Payer: BLUE CROSS/BLUE SHIELD | Source: Ambulatory Visit | Attending: Urology | Admitting: Urology

## 2017-03-26 VITALS — BP 102/65 | HR 110 | Temp 98.3°F | Resp 18 | Ht 77.5 in | Wt 140.0 lb

## 2017-03-26 DIAGNOSIS — C61 Malignant neoplasm of prostate: Secondary | ICD-10-CM | POA: Insufficient documentation

## 2017-03-26 DIAGNOSIS — Z79899 Other long term (current) drug therapy: Secondary | ICD-10-CM | POA: Diagnosis not present

## 2017-03-26 DIAGNOSIS — C7951 Secondary malignant neoplasm of bone: Secondary | ICD-10-CM | POA: Diagnosis not present

## 2017-03-26 DIAGNOSIS — R630 Anorexia: Secondary | ICD-10-CM | POA: Insufficient documentation

## 2017-03-26 DIAGNOSIS — Z888 Allergy status to other drugs, medicaments and biological substances status: Secondary | ICD-10-CM | POA: Diagnosis not present

## 2017-03-26 NOTE — Progress Notes (Signed)
Radiation Oncology         [phone] ________________________________  Name: Ryan Ware MRN: [id number]  Date: 03/26/2017  DOB: [birth year]  Post Treatment Note  CC: Clinic, [facility], [facility]  Diagnosis:   Stage IV, Gleason 4+5 prostate cancer with painful metastasis to the spine.  Interval Since Last Radiation: 9 weeks  01/07/17 - 01/22/17:    1) T5-T8 Thoracic Spine: 30 Gy in 10 fractions. 2) L1-SIJ Lumbar Spine: 30 Gy in 10 fractions   Narrative:  The patient returns today for routine follow-up.  He tolerated treatment relatively well with mild dysphagia and decreased appetite.   He has been hospitalized on multiple occasions over the past 2 months, most recently for fever of unknown origin.  He was worked up for possible sepsis but there was no identified cause of fever upon discharge.  He was hospitalized in March for a several day history of epistaxis and found to have pancytopenia thought likely secondary to bone marrow involvement from metastatic prostate cancer as well as possible splenic sequestration secondary to Hep. C.  He was transfused with 3units of PRBCs and 1unit of platelets.  Labs stabilized and he was discharged home to follow up with his Medical Oncologist, Dr. Leilani Merl at the [facility] in [place] as an outpatient regarding resuming Xtandi for his advanced prostate cancer with bony metastatic disease.  This medication was temporarily discontinued during his recent hospitalization but has since been resumed under the direction of Dr. Leilani Merl.  He is also recieving [facility] Koller, for treatment of his prostate cancer under the care of his urologist, Dr. Nicolette Bang.                  On review of systems, the patient states that he tolerated his palliative radiotherapy well and has noticed significant improvement in his lower back pain and weakness in the lower extremities, especially within the past 3-4 weeks.  He continues with decreased appetite and  is having difficulty maintaining his weight.  He denies N/V/D.  He was recently started on Decadron 4mg  po BID to help manage pain as well as for appetite stimulation.  He is taking this as prescribed and tolerating it well.  He is working with PT 2x/week to improve strength and conditioning.  He denies chest pain, SOB or dysphagia. He denies loss of bowel or bladder function.   ALLERGIES:  is allergic to acetaminophen and ibuprofen.  Meds: Current Outpatient Prescriptions  Medication Sig Dispense Refill  . bisacodyl (DULCOLAX) 10 MG suppository Place 1 suppository (10 mg total) rectally daily as needed for moderate constipation (please check rectal vault for stool burden). 12 suppository 0  . degarelix (FIRMAGON) 120 MG injection Inject 240 mg into the skin every 6 (six) months.     Marland Kitchen denosumab (PROLIA) 60 MG/ML SOLN injection Inject 60 mg into the skin every 6 (six) months. Administer in upper arm, thigh, or abdomen    . dexamethasone (DECADRON) 2 MG tablet Please take 4 mg oral 2 times daily for 3 days, then 2 mg oral 2 times daily for 3 days, then 2 mg oral daily for 3 days, then stop. (Patient taking differently: Please take 4 mg two tablets  oral 2 times daily until the tablets are gone) 21 tablet 0  . docusate sodium (COLACE) 100 MG capsule Take 1 capsule (100 mg total) by mouth 4 (four) times daily. 40 capsule 0  . enzalutamide (XTANDI) 40 MG capsule Take 160 mg by  mouth daily.    . feeding supplement, ENSURE ENLIVE, (ENSURE ENLIVE) LIQD Take 237 mLs by mouth 3 (three) times daily between meals. 789 mL 12  . folic acid (FOLVITE) 1 MG tablet Take 1 mg by mouth daily.    . Melatonin 3-2 MG TABS Take 3 mg by mouth.    . Multiple Vitamin (MULTIVITAMIN WITH MINERALS) TABS tablet Take 1 tablet by mouth daily.    Marland Kitchen omeprazole (PRILOSEC) 20 MG capsule Take 20 mg by mouth daily.    Marland Kitchen oxyCODONE (ROXICODONE) 15 MG immediate release tablet Take 15 mg by mouth every 6 (six) hours as needed for pain.      . pantoprazole (PROTONIX) 40 MG tablet Take 1 tablet (40 mg total) by mouth daily. 90 tablet 0  . polyethylene glycol (MIRALAX / GLYCOLAX) packet Take 17 g by mouth daily as needed for mild constipation.     . sodium chloride (OCEAN) 0.65 % SOLN nasal spray Place 1 spray into both nostrils as needed for congestion.     No current facility-administered medications for this encounter.     Physical Findings:  height is 6' 5.5" (1.969 m) and weight is 140 lb (63.5 kg). His oral temperature is 98.3 F (36.8 C). His blood pressure is 102/65 and his pulse is 110 (abnormal). His respiration is 18 and oxygen saturation is 100%.  Pain Assessment Pain Score: 0-No pain/10 In general this is a chronically ill appearing african [place] male in no acute distress. He's alert and oriented x4 and appropriate throughout the examination. Cardiopulmonary assessment is negative for acute distress and he exhibits normal effort. His strength is 3/5 and equal bilaterally in the LEs. Sensation is intact and equal bilaterally in the LEs.  Lab Findings: Lab Results  Component Value Date   WBC 5.7 03/12/2017   HGB 8.5 (L) 03/12/2017   HCT 25.9 (L) 03/12/2017   MCV 90.2 03/12/2017   PLT 79 (L) 03/12/2017     Radiographic Findings: Dg Chest 2 View  Result Date: 03/09/2017 CLINICAL DATA:  Fevers EXAM: CHEST  2 VIEW COMPARISON:  02/25/2017 FINDINGS: Cardiac shadow is within normal limits. The lungs are well aerated bilaterally. Some mild left basilar atelectasis is seen with elevation of the left hemidiaphragm. No sizable effusion is seen. Scattered sclerotic areas are noted within the bony structures consistent with the given clinical history of prostate carcinoma with bony metastatic disease. IMPRESSION: New left basilar atelectasis. Electronically Signed   By: Inez Catalina M.D.   On: 03/09/2017 15:43   Dg Chest 2 View  Result Date: 02/25/2017 CLINICAL DATA:  Back and chest pain for 3 days. EXAM: CHEST  2 VIEW  COMPARISON:  Chest CT 11/14/2016 FINDINGS: The cardiac silhouette, mediastinal and hilar contours are within normal limits and stable. There is mild tortuosity and ectasia of the thoracic aorta. Low lung volumes with vascular crowding and streaky atelectasis but no definite infiltrates or effusions. Diffuse osteoblastic metastatic prostate cancer again demonstrated. No obvious pathologic fracture. IMPRESSION: No acute cardiopulmonary findings. Diffuse osseous metastatic disease without obvious pathologic compression fracture in the thoracic spine. Electronically Signed   By: Marijo Sanes M.D.   On: 02/25/2017 15:48   Dg Lumbar Spine Complete  Result Date: 02/25/2017 CLINICAL DATA:  Back pain for 3 days. Prostate cancer with bone metastasis. Receiving radiation therapy. EXAM: LUMBAR SPINE - COMPLETE 4+ VIEW COMPARISON:  Radiograph 01/28/2017 FINDINGS: There is sclerosis of the vertebral bodies. No acute loss vertebral body height or disc height. Endplate  spurring at L5-S1. No pars fracture. IMPRESSION: 1. No acute findings lumbar spine. 2. Disc osteophytic disease from L4-S1. 3. Diffuse bone sclerotic metastasis. Electronically Signed   By: Suzy Bouchard M.D.   On: 02/25/2017 15:47   Ct Head Wo Contrast  Result Date: 03/09/2017 CLINICAL DATA:  Altered mental status and fever. History of prostate carcinoma EXAM: CT HEAD WITHOUT CONTRAST TECHNIQUE: Contiguous axial images were obtained from the base of the skull through the vertex without intravenous contrast. COMPARISON:  None. FINDINGS: Brain: There is age related volume loss. There is no intracranial mass, hemorrhage, extra-axial fluid collection, or midline shift. There is a focal area of decreased attenuation in the anterior limb of the right internal capsule, concerning for a small acute infarct in this area. Elsewhere there is rather minimal small vessel disease in the centra semiovale bilaterally. Vascular: No hyperdense vessel. There is  calcification in each carotid siphon region. Skull: Bony calvarium appears intact. Sinuses/Orbits: There is mucosal thickening in the sphenoid sinus regions without air-fluid level. There is slight mucosal thickening in several ethmoid air cells. Other visualized paranasal sinuses are clear. Orbits appear symmetric bilaterally. Other: Visualized mastoid air cells are clear. 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 No other evidence of acute appearing infarct. There is age related volume loss with slight periventricular small vessel disease. No hemorrhage or mass effect. Areas of arterial vascular calcification noted. Areas of paranasal sinus disease evident. Electronically Signed   By: Lowella Grip III M.D.   On: 03/09/2017 16:33   Mr Brain Wo Contrast (neuro Protocol)  Result Date: 03/09/2017 CLINICAL DATA:  Febrile, altered mental status. Possible new infarct on recent head CT. History of metastatic prostate cancer. EXAM: MRI HEAD WITHOUT CONTRAST MRA HEAD WITHOUT CONTRAST TECHNIQUE: Sagittal T1, axial coronal diffusion weighted imaging of the brain and surrounding structures were obtained without intravenous contrast. Angiographic images of the head were obtained using MRA technique without contrast. COMPARISON:  None. FINDINGS: MRI HEAD FINDINGS- motion degraded examination, incomplete. Technologist reports the patient attempted to climb at of scanner twice. BRAIN: No reduced diffusion to suggest acute ischemia, hypercellular tumor or typical infection. No hydrocephalus. No mass effect. No abnormal extra-axial fluid collections. VASCULAR: Not assessed. SKULL AND UPPER CERVICAL SPINE: No abnormal sellar expansion. Craniocervical junction maintained. Heterogeneous bone marrow signal global pole for metastatic disease given patient's history of prostate cancer SINUSES/ORBITS: Not assessed. OTHER: None. MRA HEAD FINDINGS- mildly motion degraded examination.  ANTERIOR CIRCULATION: Normal flow related enhancement of the included cervical, petrous, cavernous and supraclinoid internal carotid arteries. Patent anterior communicating artery. Normal flow related enhancement of the anterior and middle cerebral arteries, including distal segments. No definite aneurysm though limited by patient motion. POSTERIOR CIRCULATION: Codominant vertebral artery's. Basilar artery is patent, with normal flow related enhancement of the main branch vessels. Normal flow related enhancement of the posterior cerebral arteries. Small bilateral posterior communicating arteries. No definite aneurysm though limited by patient motion. ANATOMIC VARIANTS: None. IMPRESSION: MRI HEAD: Limited 3 sequence MRI of the head without acute intracranial process. MRA HEAD: Mildly motion degraded examination without emergent large vessel occlusion or severe stenosis. Electronically Signed   By: Elon Alas M.D.   On: 03/09/2017 18:04   Mr Lumbar Spine W Wo Contrast (assess For Abscess, Cord Compression)  Result Date: 02/25/2017 CLINICAL DATA:  Initial evaluation for acute back pain for 3 days. History of metastatic prostate cancer with osseous metastases. Currently undergoing radiation. EXAM: MRI LUMBAR SPINE  WITHOUT AND WITH CONTRAST TECHNIQUE: Multiplanar and multiecho pulse sequences of the lumbar spine were obtained without and with intravenous contrast. CONTRAST:  65mL MULTIHANCE GADOBENATE DIMEGLUMINE 529 MG/ML IV SOLN COMPARISON:  Comparison made with previous MRI from 12/18/2016. FINDINGS: Segmentation: Normal segmentation. Lowest well-formed disc is labeled the L5-S1 level. Alignment: Normal alignment with preservation of the normal lumbar lordosis. No listhesis. Vertebrae: Diffuse osseous metastases seen throughout the visualized bone marrow. Overall, changes appears somewhat progressed relative to recent MRI. Vertebral body heights are maintained without associated vertebral body fracture.  Extensive metastases with expansile tumor enhancement present within the visualize sacrum, greater on the right. Would be extremely difficult to exclude a superimposed pathologic fracture at this level. This is incompletely evaluated on this exam. No other associated pathologic fracture. No other significant extra osseous extension of tumor identified. Conus medullaris: Extends to the L2 level and appears normal. Paraspinal and other soft tissues: Paraspinous soft tissues demonstrate no acute abnormality. Visualized visceral structures within normal limits. Left retroperitoneal/iliac lymph nodes measure up to 17 mm in short axis (series 9, image 28). Disc levels: Diffuse congenital shortening of the pedicles. L1-2: Normal disc. Bilateral facet degeneration. No significant canal or foraminal stenosis. L2-3: Diffuse degenerative disc bulge with disc desiccation and intervertebral disc space narrowing. Epidural lipomatosis. Short pedicles. Bilateral facet degeneration with ligamentum flavum hypertrophy. Stable severe canal stenosis. Thecal sac measures approximately 7 mm in AP diameter. No significant foraminal encroachment. L3-4: Mild disc bulge. Epidural lipomatosis. Short pedicles. Bilateral facet hypertrophy. Resultant severe canal stenosis with the thecal sac measuring 7 mm in AP diameter, similar to previous. No significant foraminal encroachment. L4-5: Left E centric disc bulge. Left greater than right facet arthrosis. Short pedicles with epidural lipomatosis. Resultant severe canal stenosis with the thecal sac measuring 7 mm in AP diameter. Changes relatively stable. No significant foraminal encroachment. L5-S1: Chronic diffuse degenerative disc osteophyte with intervertebral disc space narrowing. Broad posterior component indents the ventral thecal sac, encroaching upon the bilateral lateral recesses. Moderate to advanced bilateral facet arthrosis, worse on the right. Resultant severe subarticular stenosis  bilaterally with moderate canal narrowing. Moderate right with mild left foraminal stenosis. Changes relatively stable. IMPRESSION: 1. Diffuse osseous metastatic disease, overall progressed/worsened in appearance relative to prior MRI from 12/18/2016. No associated pathologic fracture within the lumbar spine. Would be extremely difficult to exclude a superimposed pathologic fracture at the level the sacrum. 2. 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 Overall, changes are stable from previous. 3. Left retroperitoneal/iliac adenopathy as above, concerning for nodal metastatic disease. Electronically Signed   By: Jeannine Boga M.D.   On: 02/25/2017 19:12   Mr Jodene Nam Head (cerebral Arteries)  Result Date: 03/09/2017 CLINICAL DATA:  Febrile, altered mental status. Possible new infarct on recent head CT. History of metastatic prostate cancer. EXAM: MRI HEAD WITHOUT CONTRAST MRA HEAD WITHOUT CONTRAST TECHNIQUE: Sagittal T1, axial coronal diffusion weighted imaging of the brain and surrounding structures were obtained without intravenous contrast. Angiographic images of the head were obtained using MRA technique without contrast. COMPARISON:  None. FINDINGS: MRI HEAD FINDINGS- motion degraded examination, incomplete. Technologist reports the patient attempted to climb at of scanner twice. BRAIN: No reduced diffusion to suggest acute ischemia, hypercellular tumor or typical infection. No hydrocephalus. No mass effect. No abnormal extra-axial fluid collections. VASCULAR: Not assessed. SKULL AND UPPER CERVICAL SPINE: No abnormal sellar expansion. Craniocervical junction maintained. Heterogeneous bone marrow  signal global pole for metastatic disease given patient's history of prostate cancer SINUSES/ORBITS: Not assessed. OTHER: None. MRA HEAD FINDINGS- mildly motion degraded examination. ANTERIOR  CIRCULATION: Normal flow related enhancement of the included cervical, petrous, cavernous and supraclinoid internal carotid arteries. Patent anterior communicating artery. Normal flow related enhancement of the anterior and middle cerebral arteries, including distal segments. No definite aneurysm though limited by patient motion. POSTERIOR CIRCULATION: Codominant vertebral artery's. Basilar artery is patent, with normal flow related enhancement of the main branch vessels. Normal flow related enhancement of the posterior cerebral arteries. Small bilateral posterior communicating arteries. No definite aneurysm though limited by patient motion. ANATOMIC VARIANTS: None. IMPRESSION: MRI HEAD: Limited 3 sequence MRI of the head without acute intracranial process. MRA HEAD: Mildly motion degraded examination without emergent large vessel occlusion or severe stenosis. Electronically Signed   By: Elon Alas M.D.   On: 03/09/2017 18:04    Impression/Plan: 1. 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  He is advised that we are happy to contnue to follow him and participate in his care as needed but at this point, he will continue regular follow up with his medical oncologist, Dr. Leilani Merl and urologist, Dr. Alyson Ingles regarding further treatment recommendations for management of his disease.  He knows to call or contact our office with any questions or concerns related to his radiotherapy.  2. Anorexia.  He is currently taking Decadron 4mg  po BID which he will continue and complete as prescribed.  He has a planned follow up appointment with Dr. Leilani Merl on 04/02/17 for further evaluation and management.     Nicholos Johns, PA-C

## 2017-05-17 DEATH — deceased

## 2017-10-30 IMAGING — DX DG CHEST 2V
2 series · 2 of 2 positions shown · non-contrast
Comparison: None.

CLINICAL DATA: Flank pain and hematuria. Back pain and anterior
chest pain.

EXAM:
CHEST  2 VIEW

[chest pa]
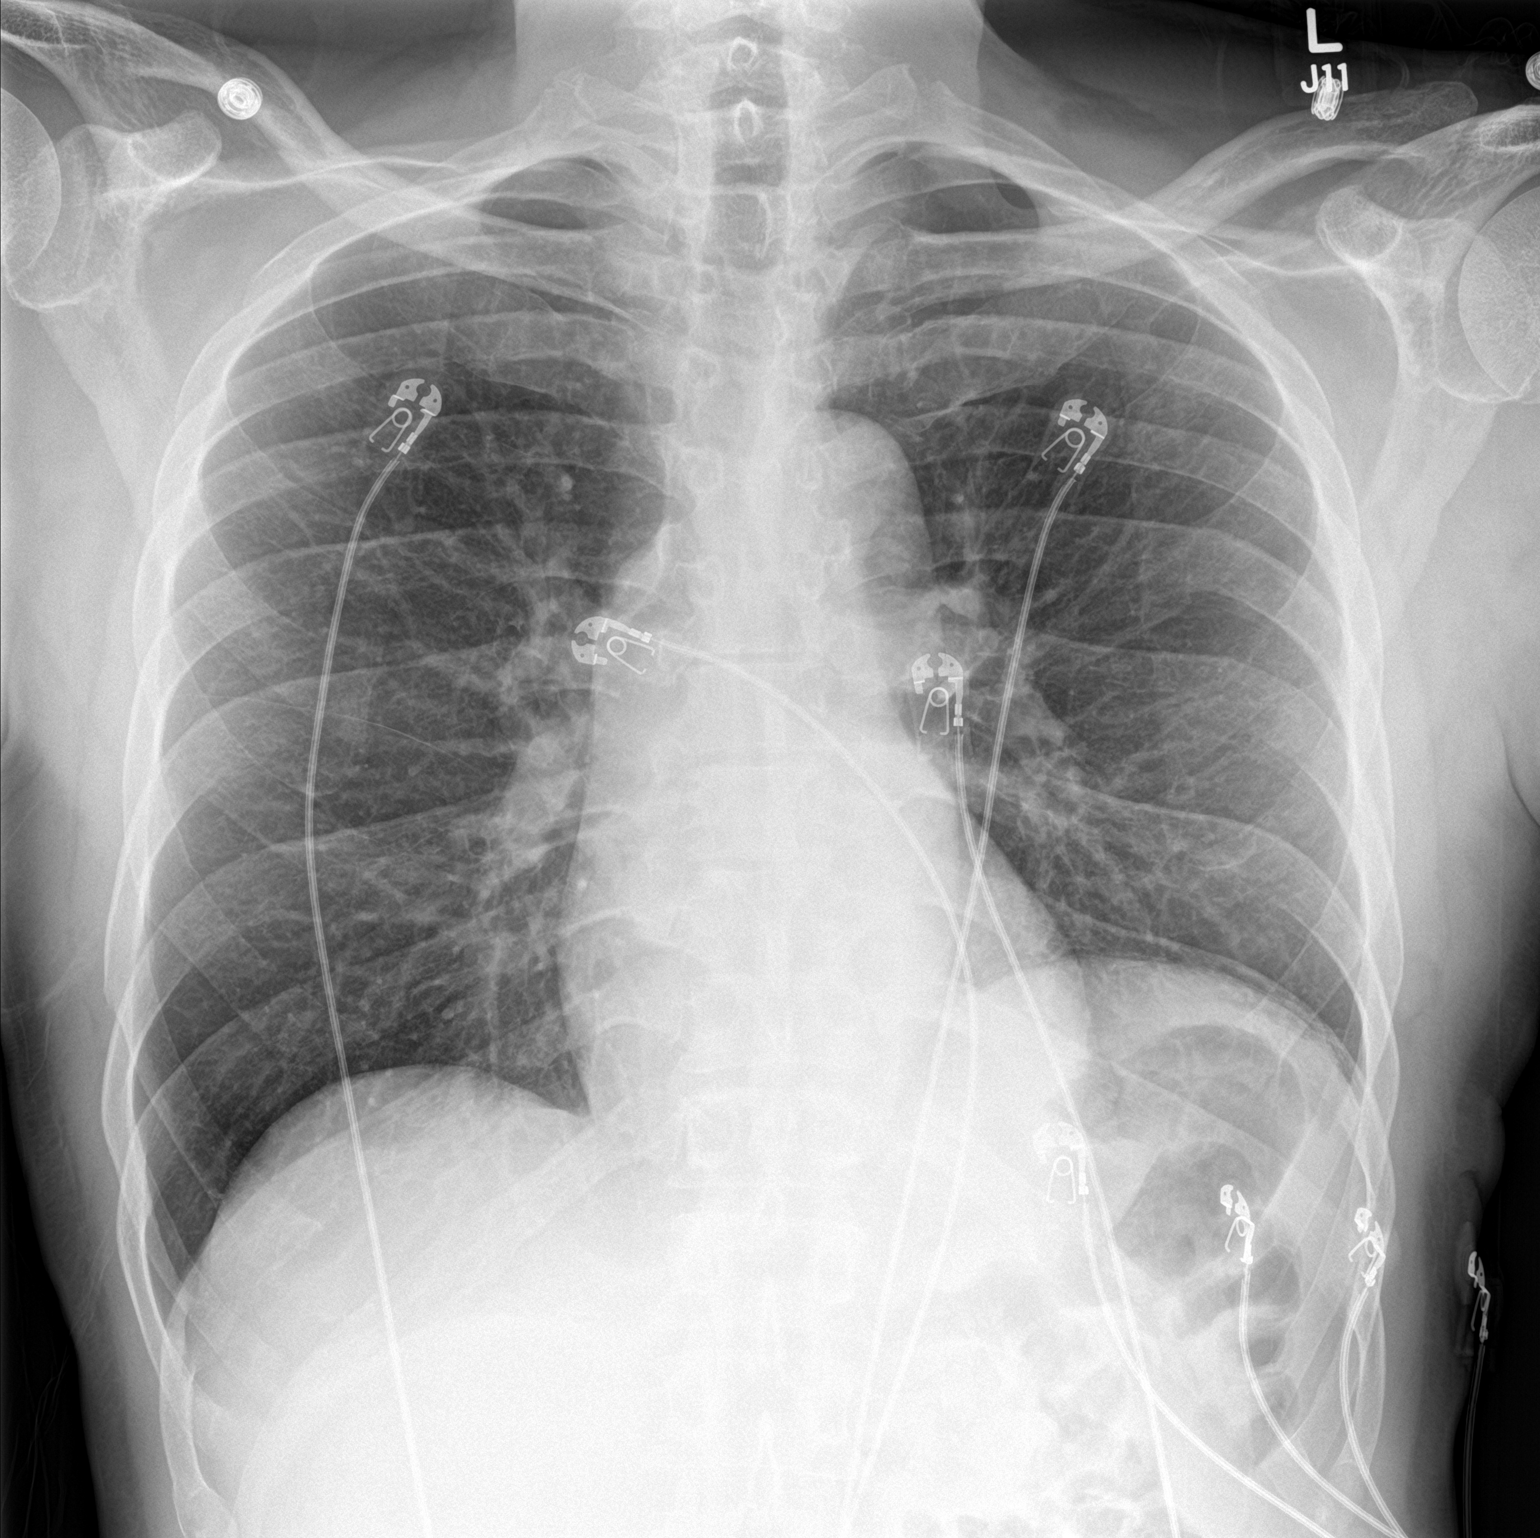

[chest lat]
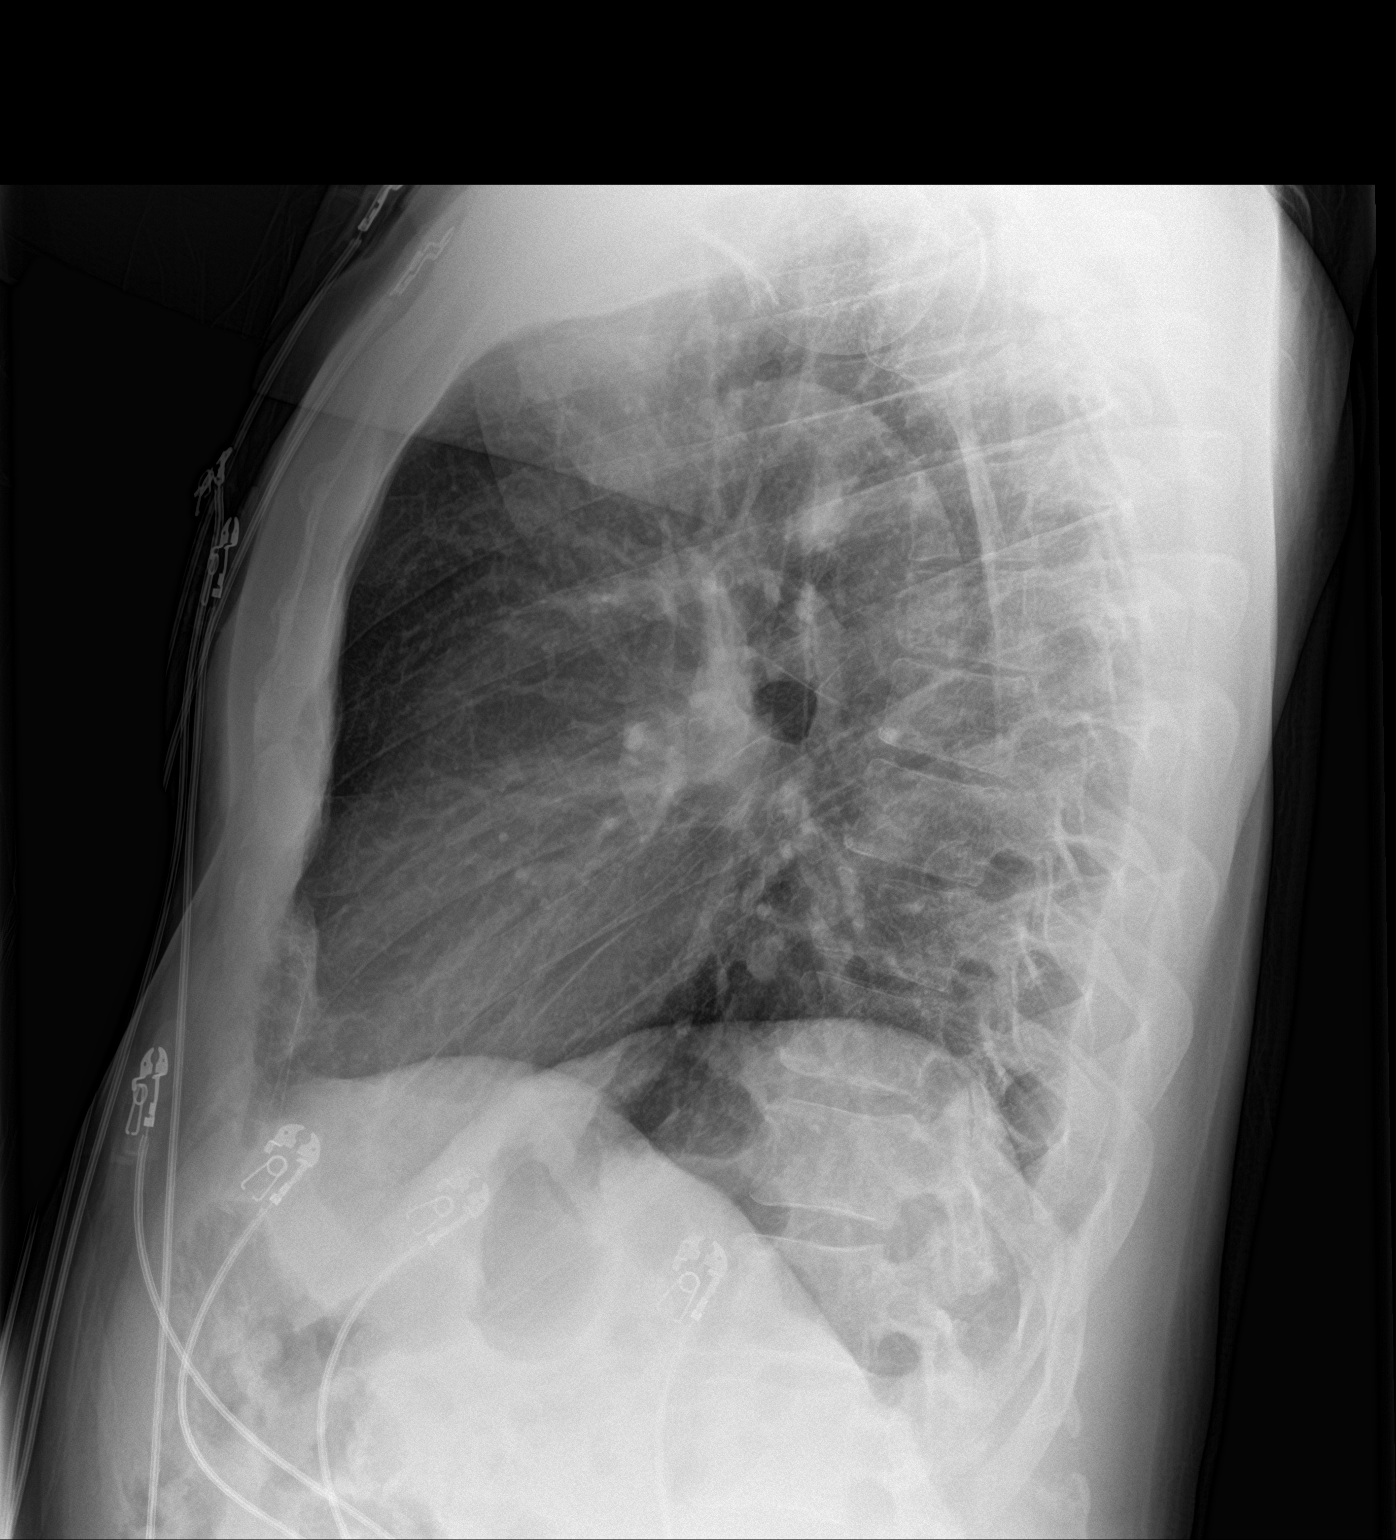

[2 of 2 positions shown; findings below may reference images not displayed]

FINDINGS: The heart size and mediastinal contours are within normal limits.
Both lungs are clear. The visualized skeletal structures are
unremarkable. Slight elevation the left hemidiaphragm, nonspecific.
IMPRESSION: No acute abnormality.

## 2018-09-20 IMAGING — MR MR THORACIC SPINE WO/W CM
6 of 23 series · 14 of 48 positions shown · IV contrast (Yes   M)
Comparison: MRI of the lumbar spine November 09, 2016

CLINICAL DATA: Progressive back pain for 6 months, burning
sensation to lower extremities for several months. History of
metastatic prostate cancer.

EXAM:
MRI TOTAL SPINE WITHOUT AND WITH CONTRAST
TECHNIQUE: Multisequence multiplanar MR imaging of the spine from the cervical
spine to the sacrum was performed prior to and following IV contrast
administration for evaluation of spinal metastatic disease.
CONTRAST:  20mL MULTIHANCE GADOBENATE DIMEGLUMINE 529 MG/ML IV SOLN

[Series 3: T2 · sagittal · 3.0mm · 0.43mm/px · 1 of 13 slices shown (1 of 6)]
[im 1/13]
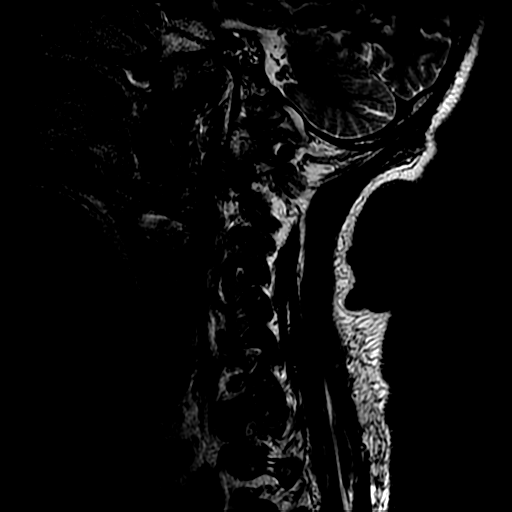

[Series 4: T2 · axial · 3.0mm · 0.39mm/px · z∈[-80,+9]mm · 3 of 30 slices shown (2 of 6)]
[im 1/30]
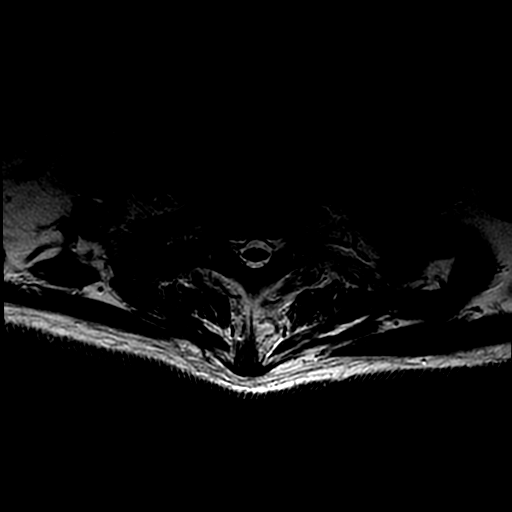
[im 15/30]
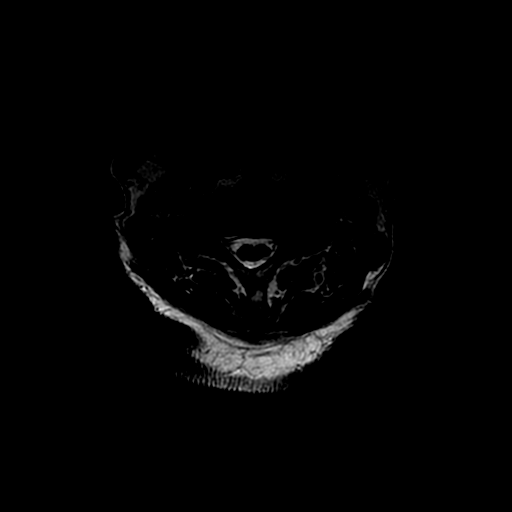
[im 30/30]
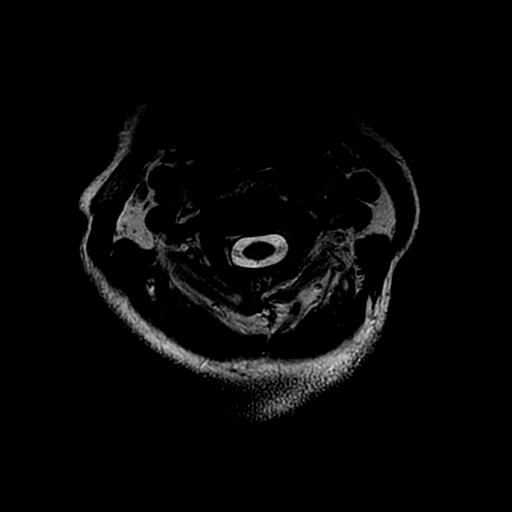

[Series 13: T2 · sagittal · 3.0mm · 0.66mm/px · 1 of 15 slices shown (3 of 6)]
[im 1/15]
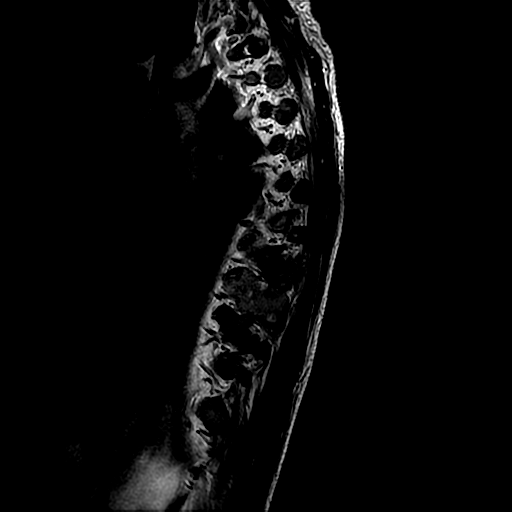

[Series 16: T2 · axial · 4.0mm · 0.43mm/px · z∈[-318,-84]mm · 4 of 44 slices shown (4 of 6)]
[im 1/44]
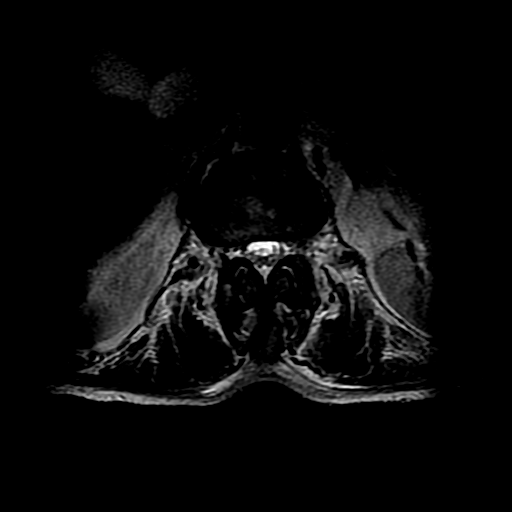
[im 15/44]
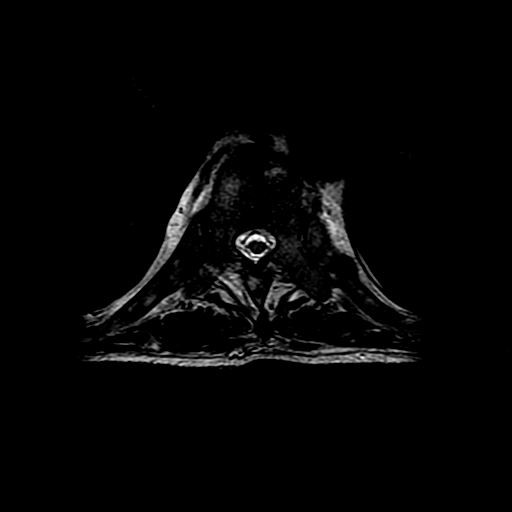
[im 29/44]
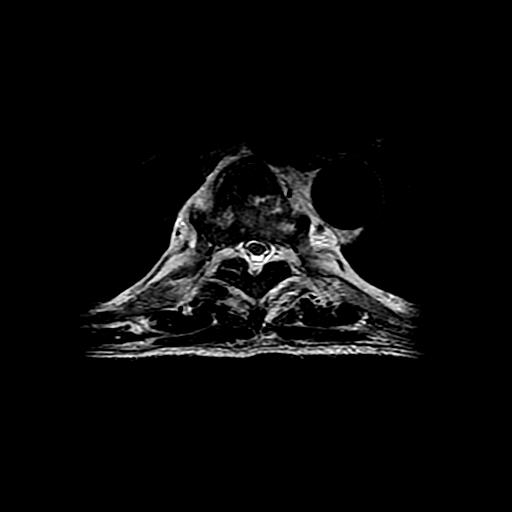
[im 44/44]
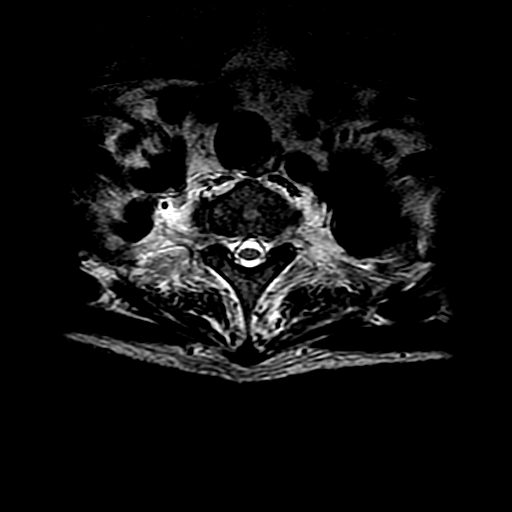

[Series 26: T2 · sagittal · 4.0mm · 0.55mm/px · 1 of 12 slices shown (5 of 6)]
[im 1/12]
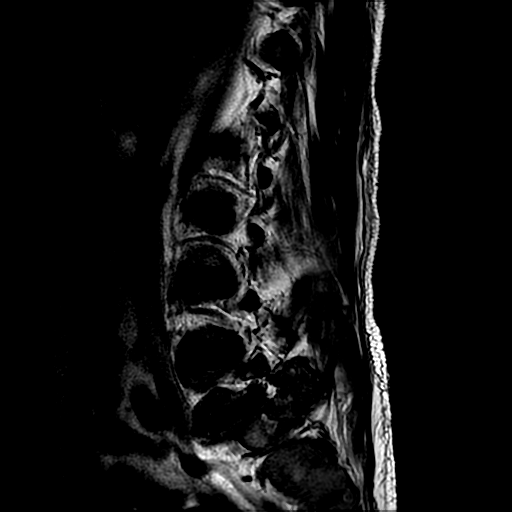

[Series 31: T2 · axial · 4.0mm · 0.39mm/px · z∈[-490,-259]mm · 4 of 41 slices shown (6 of 6)]
[im 1/41]
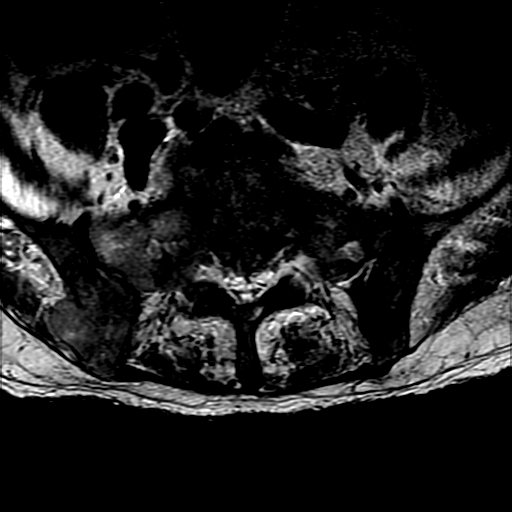
[im 14/41]
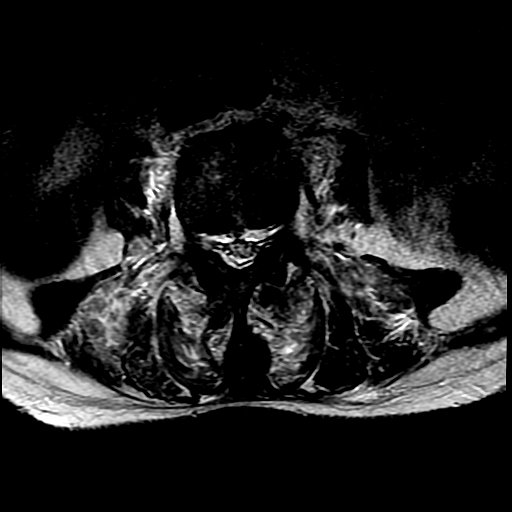
[im 27/41]
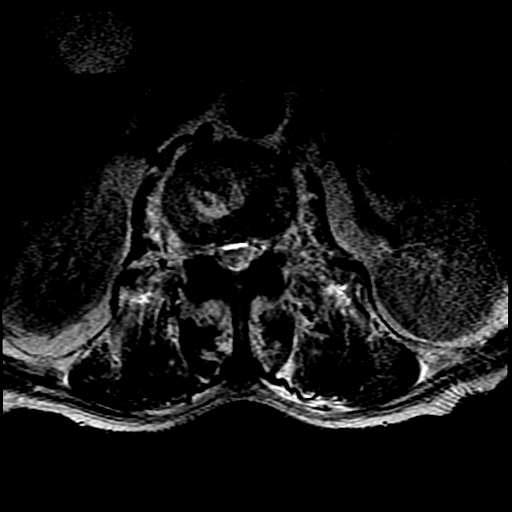
[im 41/41]
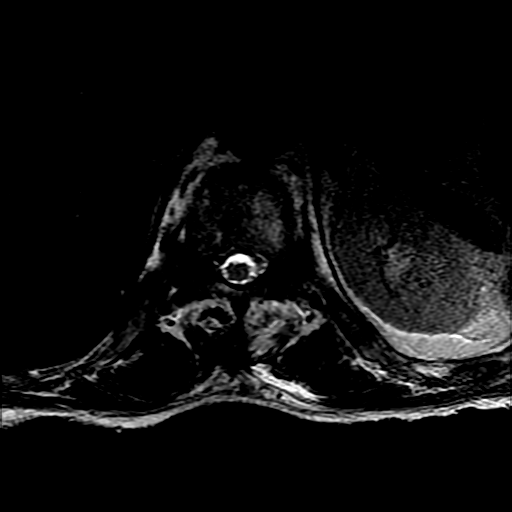

[14 of 48 positions shown; findings below may reference images not displayed]

FINDINGS: MRI CERVICAL SPINE FINDINGS

ALIGNMENT: Straightened cervical lordosis.  No malalignment.

VERTEBRAE/DISCS: Vertebral bodies are intact. Severe C3-4, moderate
C4-5, moderate C5-6, moderate to severe C6-7, severe 7 C7-T1 disc
height loss with moderate multilevel chronic discogenic endplate
changes. No STIR signal abnormality to suggest acute fracture.
Diffusely low bone marrow signal with multiple low T1, bright STIR
enhancing metastasis throughout the cervical spine. Heterogeneous
calvarium consistent with metastatic disease.

CORD:Subcentimeter focus of T2 bright signal within the LEFT greater
than RIGHT cervical spinal cord at C4 associated with cord
compression. No syrinx, no abnormal cord, leptomeningeal or epidural
enhancement.

POSTERIOR FOSSA, VERTEBRAL ARTERIES, PARASPINAL TISSUES: No MR
findings of ligamentous injury. Vertebral artery flow voids present.
Included posterior fossa and paraspinal soft tissues are
nonsuspicious.

DISC LEVELS:

C2-3: Small broad-based disc bulge asymmetric to the RIGHT.
Uncovertebral hypertrophy. Severe RIGHT, mild LEFT facet
arthropathy. No canal stenosis. Severe RIGHT, mild LEFT neural
foraminal narrowing.

C3-4: 2 mm broad-based disc bulge, uncovertebral hypertrophy mild
facet arthropathy. Severe canal stenosis, AP dimension of the canal
is 6 mm. Severe bilateral neural foraminal narrowing.

C4-5: Small broad-based disc bulge, uncovertebral hypertrophy and
mild facet arthropathy. No canal stenosis. Severe bilateral neural
foraminal narrowing.

C5-6: Small broad-based disc bulge, uncovertebral hypertrophy and
mild facet arthropathy. No canal stenosis. Moderate RIGHT, and
moderate to severe LEFT neural foraminal narrowing.

C6-7: Small broad-based disc bulge, uncovertebral hypertrophy and
mild facet arthropathy. No canal stenosis. Moderate bilateral neural
foraminal narrowing.

C7-T1: Small broad-based disc bulge, uncovertebral hypertrophy mild
facet arthropathy. No canal stenosis. Moderate to severe bilateral
neural foraminal narrowing.

MRI THORACIC SPINE FINDINGS

ALIGNMENT: Maintenance of the thoracic kyphosis. No malalignment.

VERTEBRAE/DISCS: Diffusely low bone marrow signal with multiple low
T1, bright STIR enhancing metastasis throughout the thoracic spine.
Tumoral expansion of T4, T5, T6 and T7 middle column. Tumoral
expansion RIGHT T7 pedicle and facet, LEFT T8 pedicle. Tumoral
invasion of the RIGHT epidural space from T5-6 2 T7-8, extending
into the neural foramen, RIGHT lateral posterior pleural space and
paraspinal soft tissues, RIGHT T7 rib. No fracture deformity.
Metastasis in all included ribs, including multifocal tumoral
expansion.

CORD: Multilevel cord compression, however are no cord edema. No
abnormal cord, leptomeningeal enhancement.

PREVERTEBRAL AND PARASPINAL SOFT TISSUES:  Nonacute.

DISC LEVELS:

At T6-7 and T7 RIGHT pedicle and epidural tumor effaces the thecal
sac and mildly deforms the spinal cord. Mild canal stenosis. Tumor
completely effaces the RIGHT C6-7 neural foramen. Tumor mildly
narrows the RIGHT T7-8 neural foramen.

Lower lumbar small broad-based disc bulges and facet arthropathy.
Mild-to-moderate T9-10 neural foraminal narrowing.

MRI LUMBAR SPINE FINDINGS- moderately motion degraded examination.

SEGMENTATION: For the purposes of this report, the last well-formed
intervertebral disc will be described as L5-S1.

ALIGNMENT: Maintenance of the lumbar lordosis. No malalignment.

VERTEBRAE:Diffusely low bone marrow signal with multiple low T1,
bright STIR enhancing metastasis throughout the lumbar spine. No
pathologic fracture. Moderate to severe L5-S1 disc height loss.
Multilevel mild desiccation. Moderate to severe chronic discogenic
endplate changes L5-S1, mild at the remaining lumbar levels.
Expansile tumor RIGHT iliac bone invading the gluteal and paraspinal
muscles. Congenital canal narrowing on the basis of foreshortened
pedicles. Epidural lipomatosis.

CONUS MEDULLARIS: Conus medullaris terminates at L1-2 and
demonstrates normal morphology and signal characteristics.
Disorganized proximal cauda equina due to canal stenosis at L2-3. No
abnormal cord, leptomeningeal enhancement. No epidural tumor.

PARASPINAL AND SOFT TISSUES: Paraspinal muscle bright interstitial
STIR signal and enhancement most consistent with denervation. 2 cm
gallstone.

DISC LEVELS:

L1-2: Annular bulging. Moderate facet arthropathy and ligamentum
flavum redundancy without canal stenosis or neural foraminal
narrowing.

L2-3: 3 mm broad-based disc bulge. Moderate to severe facet
arthropathy and ligamentum flavum redundancy. Epidural lipomatosis.
Moderate osseous canal stenosis and severe thecal sac effacement, 5
mm. RIGHT lateral recess stenosis may affect the traversing RIGHT L3
nerve. Mild RIGHT neural foraminal narrowing.

L3-4: Small broad-based disc bulge. Moderate to severe facet
arthropathy and ligamentum flavum redundancy. Trace facet effusions
which are likely reactive. Moderate canal stenosis. Epidural
lipomatosis. Severe effacement thecal sac narrowed to 4 mm. Mild
bilateral neural foraminal narrowing.

L4-5: 2 mm broad-based disc bulge. Moderate RIGHT and severe LEFT
facet arthropathy and ligamentum flavum redundancy with trace facet
effusions which are likely reactive. Epidural lipomatosis. Moderate
to severe canal stenosis, severe effacement thecal sac to 5 mm. LEFT
lateral recess stenosis likely affects the traversing LEFT L5 nerve.
Mild RIGHT, moderate LEFT neural foraminal narrowing.

L5-S1: 5 mm broad-based disc bulge asymmetric to the RIGHT,
enhancing central annular fissure. Severe RIGHT and moderate LEFT
facet arthropathy and ligamentum flavum redundancy. Epidural
lipomatosis. Mild canal stenosis with partially effaced thecal sac.
Moderate to severe RIGHT, mild LEFT neural foraminal narrowing.
Encroachment upon the exited RIGHT L5 nerve.
IMPRESSION: MRI CERVICAL SPINE: Diffuse osseous metastasis without pathologic
fracture.

Subcentimeter focal cord edema/ pre syrinx C4 due to severe C3-4
canal stenosis on degenerative basis.

Neural foraminal narrowing all cervical levels: Severe from C2-3
through C4-5.

MRI THORACIC SPINE: Diffuse osseous metastasis without pathologic
fracture.

Tumoral expansion and epidural invasion at T6-7 results in mild
canal stenosis and severe RIGHT T6-7 neural foraminal narrowing.
Displaced thoracic spinal cord without cord compression.

MRI LUMBAR SPINE: Diffuse osseous metastasis without pathologic
fracture.

Degenerative change and epidural lipomatosis result in severe thecal
sac effacement L2-3 through L4-5. Moderate to severe canal stenosis
L4-5, moderate at L2-3 and L3-4.

Neural foraminal narrowing L2-3 through L5-S1: Moderate to severe on
the RIGHT at L5-S1.

Acute findings discussed with and reconfirmed by Dr.[email or web address] on
